# Patient Record
Sex: Female | Born: 1935 | Race: White | Hispanic: No | Smoking: Never smoker
Health system: Southern US, Community
[De-identification: ages and names within clinical notes are randomized; demographics above are authoritative.]

## PROBLEM LIST (undated history)

## (undated) DIAGNOSIS — I10 Essential (primary) hypertension: Secondary | ICD-10-CM

## (undated) DIAGNOSIS — H269 Unspecified cataract: Secondary | ICD-10-CM

## (undated) DIAGNOSIS — H353290 Exudative age-related macular degeneration, unspecified eye, stage unspecified: Secondary | ICD-10-CM

## (undated) HISTORY — DX: Exudative age-related macular degeneration, unspecified eye, stage unspecified: H35.3290

## (undated) HISTORY — PX: SKIN CANCER EXCISION: SHX779

## (undated) HISTORY — DX: Unspecified cataract: H26.9

## (undated) HISTORY — PX: TOTAL ABDOMINAL HYSTERECTOMY: SHX209

## (undated) HISTORY — DX: Essential (primary) hypertension: I10

## (undated) HISTORY — PX: TONSILLECTOMY: SUR1361

## (undated) HISTORY — PX: INGUINAL HERNIA REPAIR: SUR1180

## (undated) HISTORY — PX: CATARACT EXTRACTION, BILATERAL: SHX1313

---
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
## 1998-12-03 ENCOUNTER — Other Ambulatory Visit: Admission: RE | Admit: 1998-12-03 | Discharge: 1998-12-03 | Payer: Self-pay | Admitting: *Deleted

## 2001-11-09 ENCOUNTER — Ambulatory Visit (HOSPITAL_COMMUNITY): Admission: RE | Admit: 2001-11-09 | Discharge: 2001-11-09 | Payer: Self-pay | Admitting: Gastroenterology

## 2001-11-29 ENCOUNTER — Encounter: Payer: Self-pay | Admitting: Gastroenterology

## 2001-11-29 ENCOUNTER — Encounter: Admission: RE | Admit: 2001-11-29 | Discharge: 2001-11-29 | Payer: Self-pay | Admitting: Gastroenterology

## 2004-01-17 ENCOUNTER — Emergency Department (HOSPITAL_COMMUNITY): Admission: EM | Admit: 2004-01-17 | Discharge: 2004-01-17 | Payer: Self-pay | Admitting: Emergency Medicine

## 2007-09-15 ENCOUNTER — Encounter: Admission: RE | Admit: 2007-09-15 | Discharge: 2007-09-15 | Payer: Self-pay | Admitting: Gastroenterology

## 2009-12-21 IMAGING — RF DG ESOPHAGUS
17 of 21 series · 19 of 24 positions shown · non-contrast
Comparison: none

CLINICAL DATA: Chest discomfort.  Difficulty swallowing. 
 BARIUM SWALLOW:

[Series 1: run · 3 of 9 slices shown (1 of 17)]
[im 1/9]
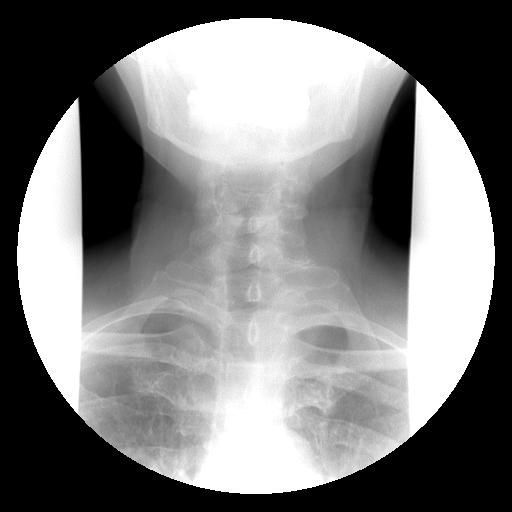
[im 3/9]
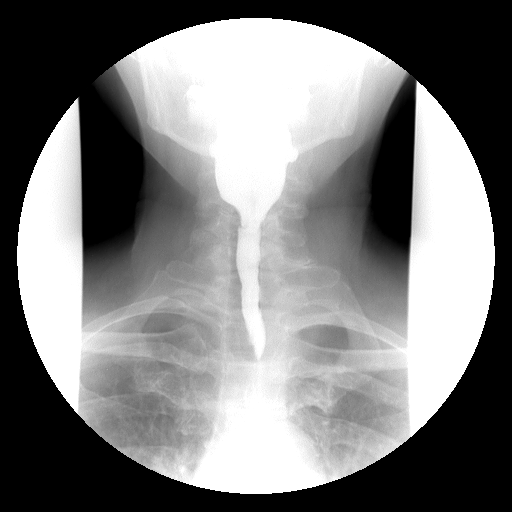
[im 9/9]
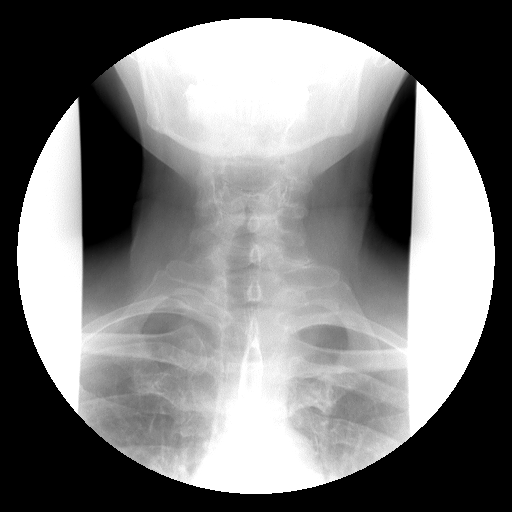

[Series 2: run · 1 of 5 slices shown (2 of 17)]
[im 1/5]
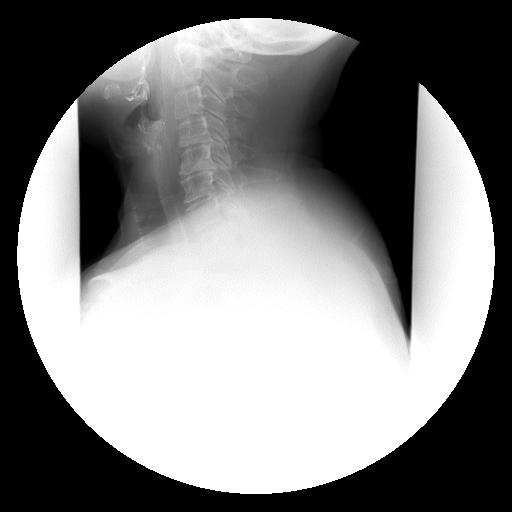

[Series 3: run · 1 of 1 slices shown (3 of 17)]
[im 1/1]
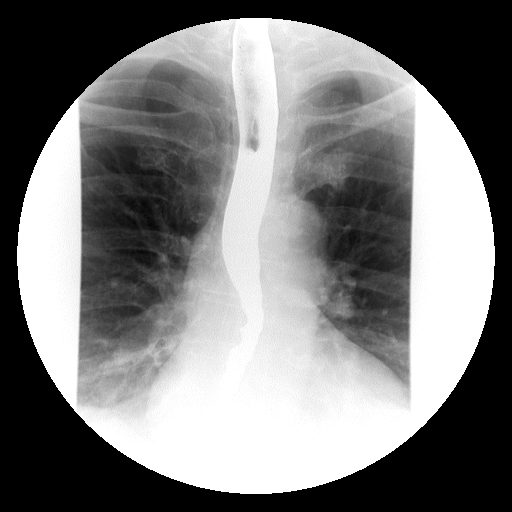

[Series 4: run · 1 of 1 slices shown (4 of 17)]
[im 1/1]
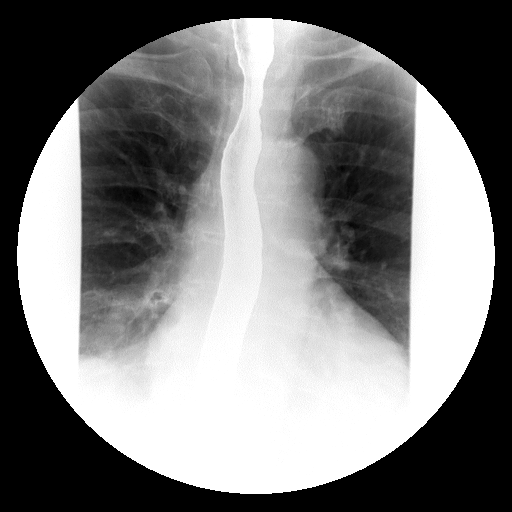

[Series 6: run · 1 of 1 slices shown (5 of 17)]
[im 1/1]
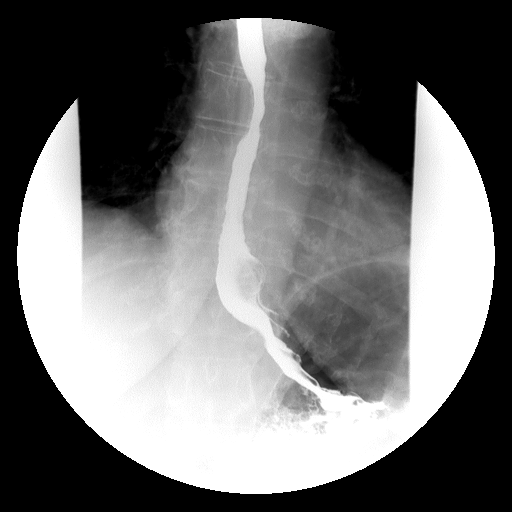

[Series 7: run · 1 of 1 slices shown (6 of 17)]
[im 1/1]
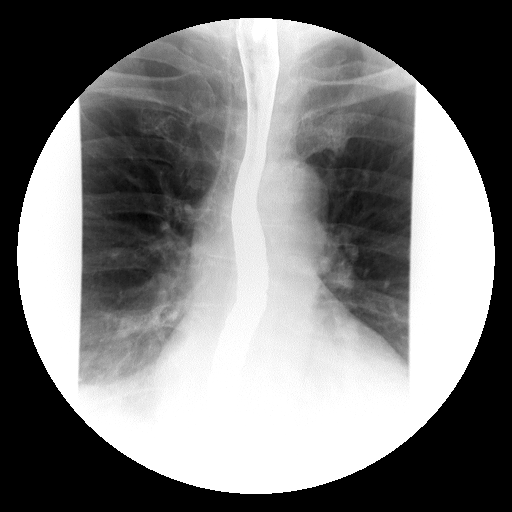

[Series 8: run · 1 of 1 slices shown (7 of 17)]
[im 1/1]
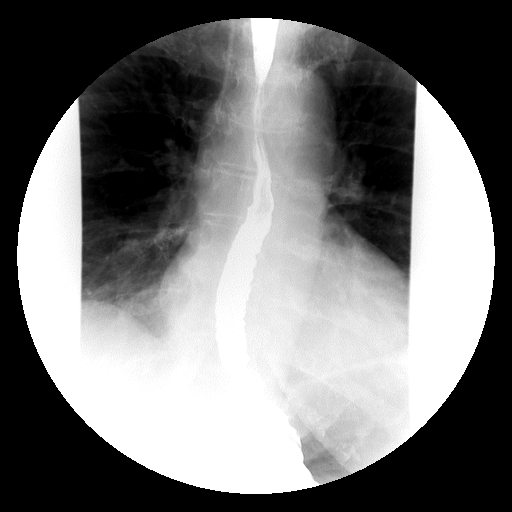

[Series 10: run · 1 of 1 slices shown (8 of 17)]
[im 1/1]
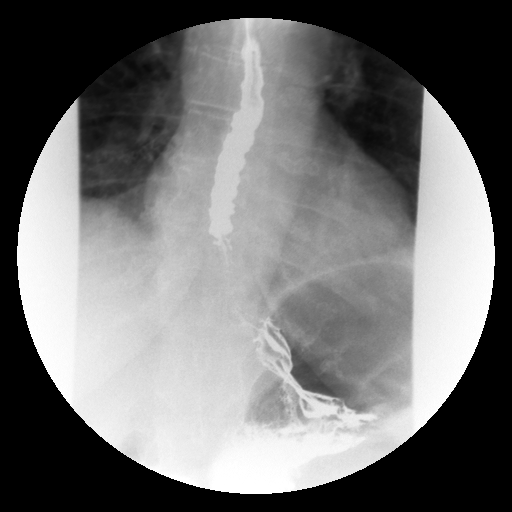

[Series 11: run · 1 of 1 slices shown (9 of 17)]
[im 1/1]
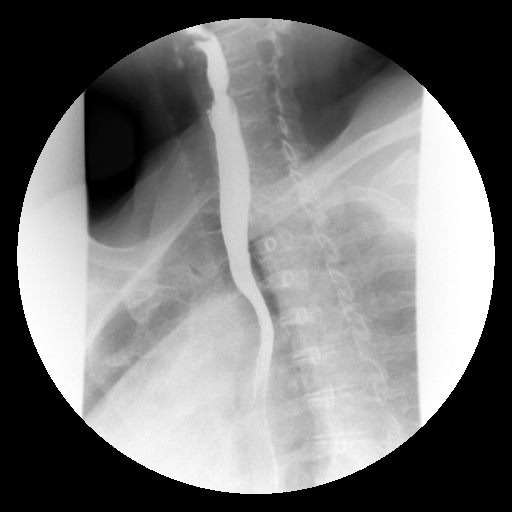

[Series 12: run · 1 of 1 slices shown (10 of 17)]
[im 1/1]
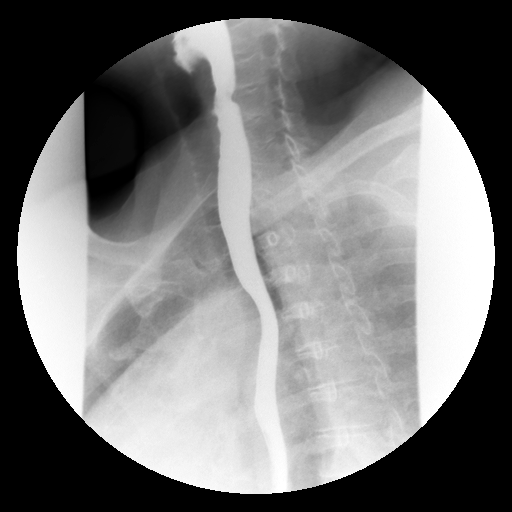

[Series 13: run · 1 of 1 slices shown (11 of 17)]
[im 1/1]
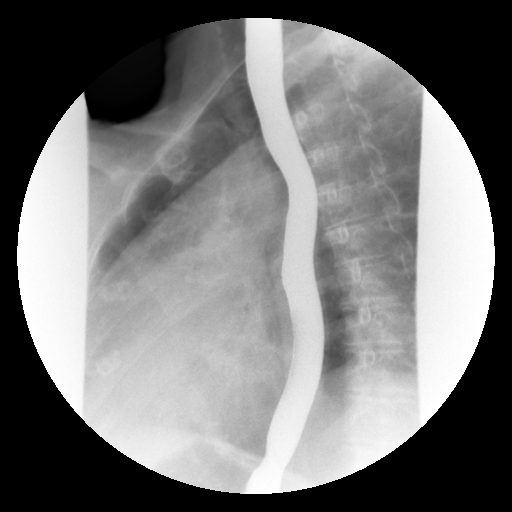

[Series 15: run · 1 of 1 slices shown (12 of 17)]
[im 1/1]
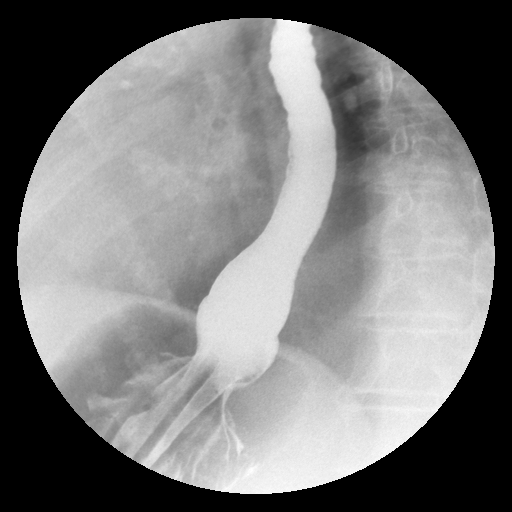

[Series 16: run · 1 of 1 slices shown (13 of 17)]
[im 1/1]
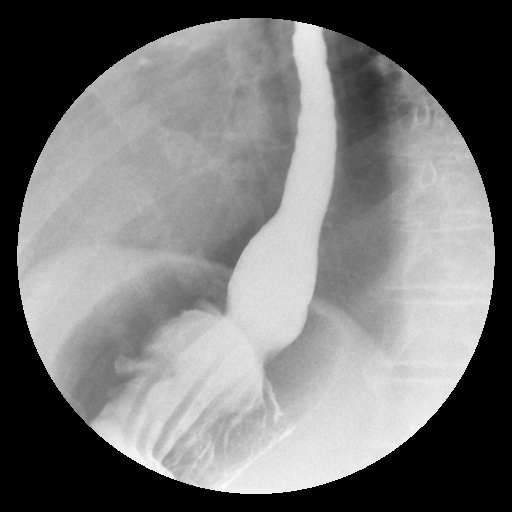

[Series 17: run · 1 of 1 slices shown (14 of 17)]
[im 1/1]
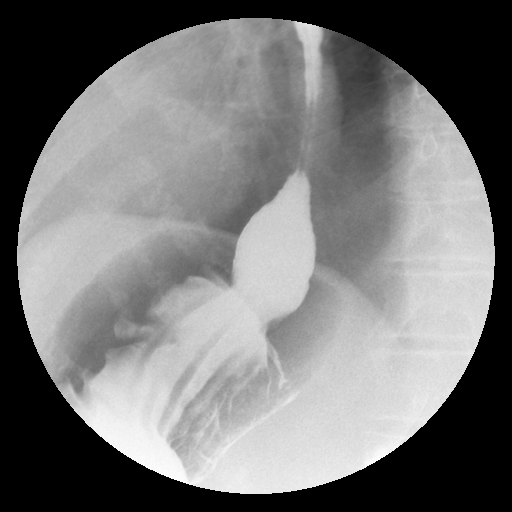

[Series 18: run · 1 of 1 slices shown (15 of 17)]
[im 1/1]
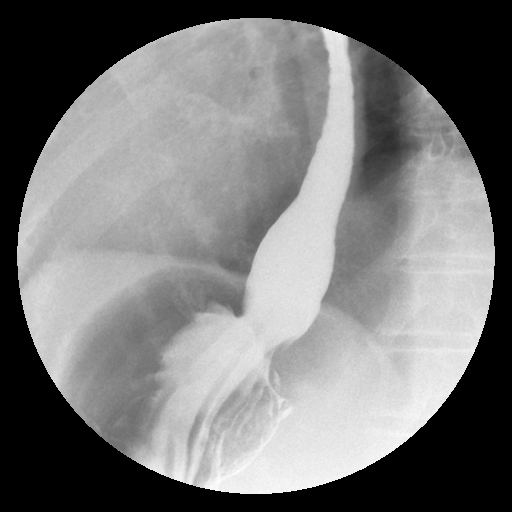

[Series 20: run · 1 of 1 slices shown (16 of 17)]
[im 1/1]
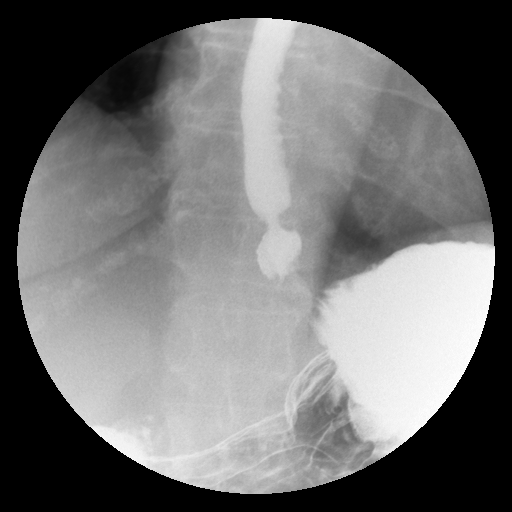

[Series 21: run · 1 of 1 slices shown (17 of 17)]
[im 1/1]
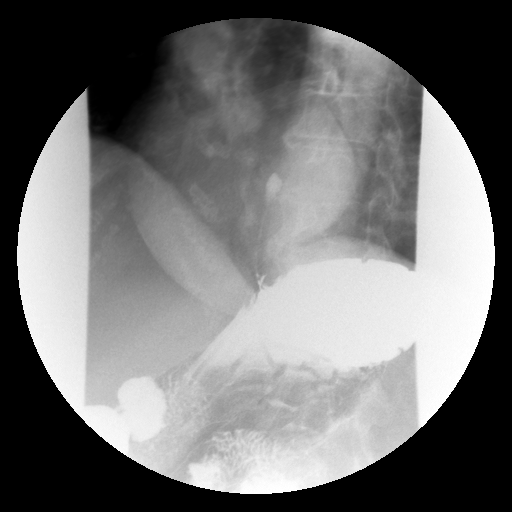

[19 of 24 positions shown; findings below may reference images not displayed]

FINDINGS: A double contrast barium swallow was performed.  The mucosa of the esophagus appears normal.  A single contrast study shows the swallowing mechanism to be normal.  There is slight prominence of the cricopharyngeus muscle.  Mild tertiary contractions are noted distally.  There is a small hiatal hernia present with only faint gastroesophageal reflux noted.  A barium pill was given at the end of the study which did pass into the stomach without delay.
IMPRESSION: 1.  Small hiatal hernia with faint reflux.  Prominent cricopharyngeus muscle. 
 2.  A barium pill passes into the stomach without delay.
 3.  Moderate tertiary contractions.

## 2011-08-16 DIAGNOSIS — M899 Disorder of bone, unspecified: Secondary | ICD-10-CM | POA: Diagnosis not present

## 2011-08-16 DIAGNOSIS — M949 Disorder of cartilage, unspecified: Secondary | ICD-10-CM | POA: Diagnosis not present

## 2011-08-16 DIAGNOSIS — F329 Major depressive disorder, single episode, unspecified: Secondary | ICD-10-CM | POA: Diagnosis not present

## 2011-08-16 DIAGNOSIS — E785 Hyperlipidemia, unspecified: Secondary | ICD-10-CM | POA: Diagnosis not present

## 2011-08-16 DIAGNOSIS — R82998 Other abnormal findings in urine: Secondary | ICD-10-CM | POA: Diagnosis not present

## 2011-08-25 DIAGNOSIS — Z0000 Encounter for general adult medical examination without abnormal findings: Secondary | ICD-10-CM | POA: Diagnosis not present

## 2011-08-25 DIAGNOSIS — M899 Disorder of bone, unspecified: Secondary | ICD-10-CM | POA: Diagnosis not present

## 2011-08-25 DIAGNOSIS — E785 Hyperlipidemia, unspecified: Secondary | ICD-10-CM | POA: Diagnosis not present

## 2011-08-25 DIAGNOSIS — Z79899 Other long term (current) drug therapy: Secondary | ICD-10-CM | POA: Diagnosis not present

## 2011-08-25 DIAGNOSIS — F329 Major depressive disorder, single episode, unspecified: Secondary | ICD-10-CM | POA: Diagnosis not present

## 2011-08-25 DIAGNOSIS — M949 Disorder of cartilage, unspecified: Secondary | ICD-10-CM | POA: Diagnosis not present

## 2011-08-27 DIAGNOSIS — Z1212 Encounter for screening for malignant neoplasm of rectum: Secondary | ICD-10-CM | POA: Diagnosis not present

## 2011-09-22 DIAGNOSIS — H4011X0 Primary open-angle glaucoma, stage unspecified: Secondary | ICD-10-CM | POA: Diagnosis not present

## 2012-03-21 DIAGNOSIS — H4011X0 Primary open-angle glaucoma, stage unspecified: Secondary | ICD-10-CM | POA: Diagnosis not present

## 2012-03-21 DIAGNOSIS — H409 Unspecified glaucoma: Secondary | ICD-10-CM | POA: Diagnosis not present

## 2012-03-24 DIAGNOSIS — L821 Other seborrheic keratosis: Secondary | ICD-10-CM | POA: Diagnosis not present

## 2012-03-24 DIAGNOSIS — L570 Actinic keratosis: Secondary | ICD-10-CM | POA: Diagnosis not present

## 2012-04-11 DIAGNOSIS — Z23 Encounter for immunization: Secondary | ICD-10-CM | POA: Diagnosis not present

## 2012-05-04 DIAGNOSIS — M949 Disorder of cartilage, unspecified: Secondary | ICD-10-CM | POA: Diagnosis not present

## 2012-05-04 DIAGNOSIS — M899 Disorder of bone, unspecified: Secondary | ICD-10-CM | POA: Diagnosis not present

## 2012-05-23 DIAGNOSIS — Z1231 Encounter for screening mammogram for malignant neoplasm of breast: Secondary | ICD-10-CM | POA: Diagnosis not present

## 2012-05-26 DIAGNOSIS — N63 Unspecified lump in unspecified breast: Secondary | ICD-10-CM | POA: Diagnosis not present

## 2012-08-25 DIAGNOSIS — M949 Disorder of cartilage, unspecified: Secondary | ICD-10-CM | POA: Diagnosis not present

## 2012-08-25 DIAGNOSIS — M899 Disorder of bone, unspecified: Secondary | ICD-10-CM | POA: Diagnosis not present

## 2012-08-25 DIAGNOSIS — E785 Hyperlipidemia, unspecified: Secondary | ICD-10-CM | POA: Diagnosis not present

## 2012-08-25 DIAGNOSIS — Z79899 Other long term (current) drug therapy: Secondary | ICD-10-CM | POA: Diagnosis not present

## 2012-09-19 DIAGNOSIS — H353190 Nonexudative age-related macular degeneration, unspecified eye, stage unspecified: Secondary | ICD-10-CM | POA: Diagnosis not present

## 2012-09-19 DIAGNOSIS — H4011X0 Primary open-angle glaucoma, stage unspecified: Secondary | ICD-10-CM | POA: Diagnosis not present

## 2012-09-19 DIAGNOSIS — H409 Unspecified glaucoma: Secondary | ICD-10-CM | POA: Diagnosis not present

## 2012-10-09 DIAGNOSIS — Z0000 Encounter for general adult medical examination without abnormal findings: Secondary | ICD-10-CM | POA: Diagnosis not present

## 2012-10-09 DIAGNOSIS — K219 Gastro-esophageal reflux disease without esophagitis: Secondary | ICD-10-CM | POA: Diagnosis not present

## 2012-10-09 DIAGNOSIS — F329 Major depressive disorder, single episode, unspecified: Secondary | ICD-10-CM | POA: Diagnosis not present

## 2012-10-09 DIAGNOSIS — M949 Disorder of cartilage, unspecified: Secondary | ICD-10-CM | POA: Diagnosis not present

## 2012-10-09 DIAGNOSIS — Z79899 Other long term (current) drug therapy: Secondary | ICD-10-CM | POA: Diagnosis not present

## 2012-10-09 DIAGNOSIS — Z1212 Encounter for screening for malignant neoplasm of rectum: Secondary | ICD-10-CM | POA: Diagnosis not present

## 2012-10-09 DIAGNOSIS — Z23 Encounter for immunization: Secondary | ICD-10-CM | POA: Diagnosis not present

## 2012-10-09 DIAGNOSIS — M899 Disorder of bone, unspecified: Secondary | ICD-10-CM | POA: Diagnosis not present

## 2012-10-09 DIAGNOSIS — E785 Hyperlipidemia, unspecified: Secondary | ICD-10-CM | POA: Diagnosis not present

## 2013-03-22 DIAGNOSIS — H4011X0 Primary open-angle glaucoma, stage unspecified: Secondary | ICD-10-CM | POA: Diagnosis not present

## 2013-03-22 DIAGNOSIS — H409 Unspecified glaucoma: Secondary | ICD-10-CM | POA: Diagnosis not present

## 2013-05-22 DIAGNOSIS — Z23 Encounter for immunization: Secondary | ICD-10-CM | POA: Diagnosis not present

## 2013-05-23 DIAGNOSIS — Z09 Encounter for follow-up examination after completed treatment for conditions other than malignant neoplasm: Secondary | ICD-10-CM | POA: Diagnosis not present

## 2013-05-23 DIAGNOSIS — Z86010 Personal history of colonic polyps: Secondary | ICD-10-CM | POA: Diagnosis not present

## 2013-05-23 DIAGNOSIS — K5730 Diverticulosis of large intestine without perforation or abscess without bleeding: Secondary | ICD-10-CM | POA: Diagnosis not present

## 2013-06-12 DIAGNOSIS — N6019 Diffuse cystic mastopathy of unspecified breast: Secondary | ICD-10-CM | POA: Diagnosis not present

## 2013-06-21 DIAGNOSIS — D0439 Carcinoma in situ of skin of other parts of face: Secondary | ICD-10-CM | POA: Diagnosis not present

## 2013-06-21 DIAGNOSIS — L821 Other seborrheic keratosis: Secondary | ICD-10-CM | POA: Diagnosis not present

## 2013-06-21 DIAGNOSIS — D239 Other benign neoplasm of skin, unspecified: Secondary | ICD-10-CM | POA: Diagnosis not present

## 2013-06-21 DIAGNOSIS — L570 Actinic keratosis: Secondary | ICD-10-CM | POA: Diagnosis not present

## 2013-06-21 DIAGNOSIS — D0430 Carcinoma in situ of skin of unspecified part of face: Secondary | ICD-10-CM | POA: Diagnosis not present

## 2013-06-21 DIAGNOSIS — D485 Neoplasm of uncertain behavior of skin: Secondary | ICD-10-CM | POA: Diagnosis not present

## 2013-06-21 DIAGNOSIS — Z85828 Personal history of other malignant neoplasm of skin: Secondary | ICD-10-CM | POA: Diagnosis not present

## 2013-06-21 DIAGNOSIS — L909 Atrophic disorder of skin, unspecified: Secondary | ICD-10-CM | POA: Diagnosis not present

## 2013-08-01 DIAGNOSIS — Z85828 Personal history of other malignant neoplasm of skin: Secondary | ICD-10-CM | POA: Diagnosis not present

## 2013-08-01 DIAGNOSIS — D0439 Carcinoma in situ of skin of other parts of face: Secondary | ICD-10-CM | POA: Diagnosis not present

## 2013-08-01 DIAGNOSIS — D0430 Carcinoma in situ of skin of unspecified part of face: Secondary | ICD-10-CM | POA: Diagnosis not present

## 2013-08-01 DIAGNOSIS — L738 Other specified follicular disorders: Secondary | ICD-10-CM | POA: Diagnosis not present

## 2013-09-24 DIAGNOSIS — H4011X0 Primary open-angle glaucoma, stage unspecified: Secondary | ICD-10-CM | POA: Diagnosis not present

## 2013-09-24 DIAGNOSIS — H52209 Unspecified astigmatism, unspecified eye: Secondary | ICD-10-CM | POA: Diagnosis not present

## 2013-09-24 DIAGNOSIS — H409 Unspecified glaucoma: Secondary | ICD-10-CM | POA: Diagnosis not present

## 2013-09-24 DIAGNOSIS — H2510 Age-related nuclear cataract, unspecified eye: Secondary | ICD-10-CM | POA: Diagnosis not present

## 2013-10-08 DIAGNOSIS — E785 Hyperlipidemia, unspecified: Secondary | ICD-10-CM | POA: Diagnosis not present

## 2013-10-08 DIAGNOSIS — M899 Disorder of bone, unspecified: Secondary | ICD-10-CM | POA: Diagnosis not present

## 2013-10-08 DIAGNOSIS — Z79899 Other long term (current) drug therapy: Secondary | ICD-10-CM | POA: Diagnosis not present

## 2013-10-08 DIAGNOSIS — M949 Disorder of cartilage, unspecified: Secondary | ICD-10-CM | POA: Diagnosis not present

## 2013-10-15 DIAGNOSIS — Z79899 Other long term (current) drug therapy: Secondary | ICD-10-CM | POA: Diagnosis not present

## 2013-10-15 DIAGNOSIS — Z1331 Encounter for screening for depression: Secondary | ICD-10-CM | POA: Diagnosis not present

## 2013-10-15 DIAGNOSIS — Z1212 Encounter for screening for malignant neoplasm of rectum: Secondary | ICD-10-CM | POA: Diagnosis not present

## 2013-10-15 DIAGNOSIS — Z0000 Encounter for general adult medical examination without abnormal findings: Secondary | ICD-10-CM | POA: Diagnosis not present

## 2013-10-15 DIAGNOSIS — E785 Hyperlipidemia, unspecified: Secondary | ICD-10-CM | POA: Diagnosis not present

## 2013-10-15 DIAGNOSIS — K219 Gastro-esophageal reflux disease without esophagitis: Secondary | ICD-10-CM | POA: Diagnosis not present

## 2013-10-15 DIAGNOSIS — M949 Disorder of cartilage, unspecified: Secondary | ICD-10-CM | POA: Diagnosis not present

## 2013-10-15 DIAGNOSIS — F329 Major depressive disorder, single episode, unspecified: Secondary | ICD-10-CM | POA: Diagnosis not present

## 2013-10-15 DIAGNOSIS — M899 Disorder of bone, unspecified: Secondary | ICD-10-CM | POA: Diagnosis not present

## 2013-10-15 DIAGNOSIS — F3289 Other specified depressive episodes: Secondary | ICD-10-CM | POA: Diagnosis not present

## 2013-10-26 DIAGNOSIS — Z85828 Personal history of other malignant neoplasm of skin: Secondary | ICD-10-CM | POA: Diagnosis not present

## 2013-10-26 DIAGNOSIS — L819 Disorder of pigmentation, unspecified: Secondary | ICD-10-CM | POA: Diagnosis not present

## 2013-10-26 DIAGNOSIS — L821 Other seborrheic keratosis: Secondary | ICD-10-CM | POA: Diagnosis not present

## 2013-10-26 DIAGNOSIS — Z23 Encounter for immunization: Secondary | ICD-10-CM | POA: Diagnosis not present

## 2013-10-26 DIAGNOSIS — L570 Actinic keratosis: Secondary | ICD-10-CM | POA: Diagnosis not present

## 2014-05-03 DIAGNOSIS — H2513 Age-related nuclear cataract, bilateral: Secondary | ICD-10-CM | POA: Diagnosis not present

## 2014-05-03 DIAGNOSIS — H3531 Nonexudative age-related macular degeneration: Secondary | ICD-10-CM | POA: Diagnosis not present

## 2014-05-03 DIAGNOSIS — H4011X1 Primary open-angle glaucoma, mild stage: Secondary | ICD-10-CM | POA: Diagnosis not present

## 2014-05-16 DIAGNOSIS — H2512 Age-related nuclear cataract, left eye: Secondary | ICD-10-CM | POA: Diagnosis not present

## 2014-05-16 DIAGNOSIS — H25812 Combined forms of age-related cataract, left eye: Secondary | ICD-10-CM | POA: Diagnosis not present

## 2014-05-17 DIAGNOSIS — Z23 Encounter for immunization: Secondary | ICD-10-CM | POA: Diagnosis not present

## 2014-05-23 DIAGNOSIS — H2511 Age-related nuclear cataract, right eye: Secondary | ICD-10-CM | POA: Diagnosis not present

## 2014-05-23 DIAGNOSIS — H25811 Combined forms of age-related cataract, right eye: Secondary | ICD-10-CM | POA: Diagnosis not present

## 2014-06-25 DIAGNOSIS — M8580 Other specified disorders of bone density and structure, unspecified site: Secondary | ICD-10-CM | POA: Diagnosis not present

## 2014-06-25 DIAGNOSIS — Z8262 Family history of osteoporosis: Secondary | ICD-10-CM | POA: Diagnosis not present

## 2014-06-25 DIAGNOSIS — Z1231 Encounter for screening mammogram for malignant neoplasm of breast: Secondary | ICD-10-CM | POA: Diagnosis not present

## 2014-09-10 DIAGNOSIS — D692 Other nonthrombocytopenic purpura: Secondary | ICD-10-CM | POA: Diagnosis not present

## 2014-09-10 DIAGNOSIS — D225 Melanocytic nevi of trunk: Secondary | ICD-10-CM | POA: Diagnosis not present

## 2014-09-10 DIAGNOSIS — L570 Actinic keratosis: Secondary | ICD-10-CM | POA: Diagnosis not present

## 2014-09-10 DIAGNOSIS — L821 Other seborrheic keratosis: Secondary | ICD-10-CM | POA: Diagnosis not present

## 2014-09-10 DIAGNOSIS — L918 Other hypertrophic disorders of the skin: Secondary | ICD-10-CM | POA: Diagnosis not present

## 2014-09-10 DIAGNOSIS — L820 Inflamed seborrheic keratosis: Secondary | ICD-10-CM | POA: Diagnosis not present

## 2014-09-10 DIAGNOSIS — Z85828 Personal history of other malignant neoplasm of skin: Secondary | ICD-10-CM | POA: Diagnosis not present

## 2014-09-10 DIAGNOSIS — L814 Other melanin hyperpigmentation: Secondary | ICD-10-CM | POA: Diagnosis not present

## 2014-09-10 DIAGNOSIS — D1801 Hemangioma of skin and subcutaneous tissue: Secondary | ICD-10-CM | POA: Diagnosis not present

## 2014-10-30 DIAGNOSIS — H40033 Anatomical narrow angle, bilateral: Secondary | ICD-10-CM | POA: Diagnosis not present

## 2014-10-30 DIAGNOSIS — H4011X1 Primary open-angle glaucoma, mild stage: Secondary | ICD-10-CM | POA: Diagnosis not present

## 2014-11-04 DIAGNOSIS — E785 Hyperlipidemia, unspecified: Secondary | ICD-10-CM | POA: Diagnosis not present

## 2014-11-04 DIAGNOSIS — Z79899 Other long term (current) drug therapy: Secondary | ICD-10-CM | POA: Diagnosis not present

## 2014-11-04 DIAGNOSIS — M859 Disorder of bone density and structure, unspecified: Secondary | ICD-10-CM | POA: Diagnosis not present

## 2014-11-11 DIAGNOSIS — Z0000 Encounter for general adult medical examination without abnormal findings: Secondary | ICD-10-CM | POA: Diagnosis not present

## 2014-11-11 DIAGNOSIS — E785 Hyperlipidemia, unspecified: Secondary | ICD-10-CM | POA: Diagnosis not present

## 2014-11-11 DIAGNOSIS — Z79899 Other long term (current) drug therapy: Secondary | ICD-10-CM | POA: Diagnosis not present

## 2014-11-11 DIAGNOSIS — Z6827 Body mass index (BMI) 27.0-27.9, adult: Secondary | ICD-10-CM | POA: Diagnosis not present

## 2014-11-11 DIAGNOSIS — F329 Major depressive disorder, single episode, unspecified: Secondary | ICD-10-CM | POA: Diagnosis not present

## 2014-11-11 DIAGNOSIS — M859 Disorder of bone density and structure, unspecified: Secondary | ICD-10-CM | POA: Diagnosis not present

## 2014-11-11 DIAGNOSIS — Z1212 Encounter for screening for malignant neoplasm of rectum: Secondary | ICD-10-CM | POA: Diagnosis not present

## 2014-11-11 DIAGNOSIS — Z1389 Encounter for screening for other disorder: Secondary | ICD-10-CM | POA: Diagnosis not present

## 2014-11-11 DIAGNOSIS — R7301 Impaired fasting glucose: Secondary | ICD-10-CM | POA: Diagnosis not present

## 2015-02-25 ENCOUNTER — Ambulatory Visit (INDEPENDENT_AMBULATORY_CARE_PROVIDER_SITE_OTHER): Payer: Medicare Other

## 2015-02-25 ENCOUNTER — Ambulatory Visit (INDEPENDENT_AMBULATORY_CARE_PROVIDER_SITE_OTHER): Payer: Medicare Other | Admitting: Podiatry

## 2015-02-25 VITALS — BP 152/78 | HR 75 | Resp 15

## 2015-02-25 DIAGNOSIS — M722 Plantar fascial fibromatosis: Secondary | ICD-10-CM

## 2015-02-25 MED ORDER — MELOXICAM 15 MG PO TABS: 15.0000 mg | ORAL_TABLET | Freq: Every day | ORAL | Status: DC

## 2015-02-25 NOTE — Progress Notes (Signed)
   Subjective:    Patient ID: Barbara Mccoy, female    DOB: 09/21/1932, 81 y.o.   MRN: [id number]  HPIThis patient presents to the office with pain in her left heel since April.  She says she had been exercising on her tread mill and she experiences pain after exercising. She says her pain has been present and she has stopped exercising.  Her pain is noted upon rising in the morning and standing from a sitting position. She says she needs to move her foot at times to limit pain while sitting.    Review of Systems  Musculoskeletal: Positive for gait problem.  All other systems reviewed and are negative.      Objective:   Physical Exam GENERAL APPEARANCE: Alert, conversant. Appropriately groomed. No acute distress.  VASCULAR: Pedal pulses palpable at 2/4 DP and PT bilateral.  Capillary refill time is immediate to all digits,  Proximal to distal cooling it warm to warm.  Digital hair growth is present bilateral  NEUROLOGIC: sensation is intact epicritically and protectively to 5.07 monofilament at 5/5 sites bilateral.  Light touch is intact bilateral, vibratory sensation intact bilateral, achilles tendon reflex is intact bilateral.  MUSCULOSKELETAL: acceptable muscle strength, tone and stability bilateral.  Intrinsic muscluature intact bilateral.  Rectus appearance of foot and digits noted bilateral. Palpable pain at the insertion plantar fascia left foot.  Pain along the course of the plantar fascia left foot.  DERMATOLOGIC: skin color, texture, and turgor are within normal limits.  No preulcerative lesions or ulcers  are seen, no interdigital maceration noted.  No open lesions present.  Digital nails are asymptomatic. No drainage noted.         Assessment & Plan:  Plantar Fasciitis left heel.    IE  X-ray  Examined her OTC insoles and they were questionable.  She says her pain is only a four.  We discussed treatment and decided to use ice and stretching in addition to Mobic.  RTC 2  weeks.

## 2015-03-21 ENCOUNTER — Telehealth: Payer: Self-pay | Admitting: *Deleted

## 2015-03-21 MED ORDER — MELOXICAM 15 MG PO TABS: 15.0000 mg | ORAL_TABLET | Freq: Every day | ORAL | Status: DC

## 2015-03-21 NOTE — Telephone Encounter (Signed)
Pt request refill of Mobic to get her to the 03/28/2015 appt with Dr. Prudence Davidson.  Dr. Prudence Davidson states refill #10 Meloxicam 15mg .

## 2015-03-28 ENCOUNTER — Telehealth: Payer: Self-pay

## 2015-03-28 ENCOUNTER — Encounter: Payer: Self-pay | Admitting: Podiatry

## 2015-03-28 ENCOUNTER — Ambulatory Visit (INDEPENDENT_AMBULATORY_CARE_PROVIDER_SITE_OTHER): Payer: Medicare Other | Admitting: Podiatry

## 2015-03-28 VITALS — BP 158/82 | HR 67 | Resp 17

## 2015-03-28 DIAGNOSIS — M722 Plantar fascial fibromatosis: Secondary | ICD-10-CM | POA: Diagnosis not present

## 2015-03-28 NOTE — Progress Notes (Signed)
Subjective:     Patient ID: Barbara Mccoy, female   DOB: 03-28-1933, 81 y.o.   MRN: [id number]  HPI This patient presents to the office saying her plantar fascitis left foot has been doing better and she is experiencing less pain.  She has been taking Mobic which was prescribed and she is having no side effects from the medication.She presents for continued evaluation and treatment.  Review of Systems     Objective:   Physical Exam GENERAL APPEARANCE: Alert, conversant. Appropriately groomed. No acute distress.  VASCULAR: Pedal pulses palpable at  [facility] and PT bilateral.  Capillary refill time is immediate to all digits,  Normal temperature gradient.  Digital hair growth is present bilateral  NEUROLOGIC: sensation is normal to 5.07 monofilament at 5/5 sites bilateral.  Light touch is intact bilateral, Muscle strength normal.  MUSCULOSKELETAL: acceptable muscle strength, tone and stability bilateral.  Intrinsic muscluature intact bilateral.  Rectus appearance of foot and digits noted bilateral. Persistant left heel swelling but no pain noted.  DERMATOLOGIC: skin color, texture, and turgor are within normal limits.  No preulcerative lesions or ulcers  are seen, no interdigital maceration noted.  No open lesions present.  Digital nails are asymptomatic. No drainage noted.      Assessment:     Plantar fascitis     Plan:     ROV>  Prescribed two additional prescriptions of Mobic.  RTC 2 months.

## 2015-03-28 NOTE — Telephone Encounter (Signed)
Called in Mobic 1 QDPC 2 refills , called from [facility]

## 2015-05-23 DIAGNOSIS — K219 Gastro-esophageal reflux disease without esophagitis: Secondary | ICD-10-CM | POA: Diagnosis not present

## 2015-05-23 DIAGNOSIS — E785 Hyperlipidemia, unspecified: Secondary | ICD-10-CM | POA: Diagnosis not present

## 2015-05-23 DIAGNOSIS — Z6828 Body mass index (BMI) 28.0-28.9, adult: Secondary | ICD-10-CM | POA: Diagnosis not present

## 2015-05-23 DIAGNOSIS — R7301 Impaired fasting glucose: Secondary | ICD-10-CM | POA: Diagnosis not present

## 2015-05-23 DIAGNOSIS — Z23 Encounter for immunization: Secondary | ICD-10-CM | POA: Diagnosis not present

## 2015-05-23 DIAGNOSIS — M859 Disorder of bone density and structure, unspecified: Secondary | ICD-10-CM | POA: Diagnosis not present

## 2015-05-23 DIAGNOSIS — F329 Major depressive disorder, single episode, unspecified: Secondary | ICD-10-CM | POA: Diagnosis not present

## 2015-07-10 DIAGNOSIS — Z1231 Encounter for screening mammogram for malignant neoplasm of breast: Secondary | ICD-10-CM | POA: Diagnosis not present

## 2015-08-26 DIAGNOSIS — H401111 Primary open-angle glaucoma, right eye, mild stage: Secondary | ICD-10-CM | POA: Diagnosis not present

## 2015-08-26 DIAGNOSIS — H401122 Primary open-angle glaucoma, left eye, moderate stage: Secondary | ICD-10-CM | POA: Diagnosis not present

## 2015-08-26 DIAGNOSIS — H353132 Nonexudative age-related macular degeneration, bilateral, intermediate dry stage: Secondary | ICD-10-CM | POA: Diagnosis not present

## 2015-08-26 DIAGNOSIS — H52203 Unspecified astigmatism, bilateral: Secondary | ICD-10-CM | POA: Diagnosis not present

## 2015-11-05 DIAGNOSIS — R7301 Impaired fasting glucose: Secondary | ICD-10-CM | POA: Diagnosis not present

## 2015-11-05 DIAGNOSIS — Z79899 Other long term (current) drug therapy: Secondary | ICD-10-CM | POA: Diagnosis not present

## 2015-11-05 DIAGNOSIS — E784 Other hyperlipidemia: Secondary | ICD-10-CM | POA: Diagnosis not present

## 2015-11-05 DIAGNOSIS — M859 Disorder of bone density and structure, unspecified: Secondary | ICD-10-CM | POA: Diagnosis not present

## 2015-11-12 DIAGNOSIS — Z6828 Body mass index (BMI) 28.0-28.9, adult: Secondary | ICD-10-CM | POA: Diagnosis not present

## 2015-11-12 DIAGNOSIS — M859 Disorder of bone density and structure, unspecified: Secondary | ICD-10-CM | POA: Diagnosis not present

## 2015-11-12 DIAGNOSIS — Z1389 Encounter for screening for other disorder: Secondary | ICD-10-CM | POA: Diagnosis not present

## 2015-11-12 DIAGNOSIS — Z0000 Encounter for general adult medical examination without abnormal findings: Secondary | ICD-10-CM | POA: Diagnosis not present

## 2015-11-12 DIAGNOSIS — F329 Major depressive disorder, single episode, unspecified: Secondary | ICD-10-CM | POA: Diagnosis not present

## 2015-11-12 DIAGNOSIS — E784 Other hyperlipidemia: Secondary | ICD-10-CM | POA: Diagnosis not present

## 2015-11-12 DIAGNOSIS — Z23 Encounter for immunization: Secondary | ICD-10-CM | POA: Diagnosis not present

## 2015-11-12 DIAGNOSIS — R7301 Impaired fasting glucose: Secondary | ICD-10-CM | POA: Diagnosis not present

## 2015-11-12 DIAGNOSIS — H5310 Unspecified subjective visual disturbances: Secondary | ICD-10-CM | POA: Diagnosis not present

## 2015-11-12 DIAGNOSIS — K219 Gastro-esophageal reflux disease without esophagitis: Secondary | ICD-10-CM | POA: Diagnosis not present

## 2015-11-13 ENCOUNTER — Other Ambulatory Visit (HOSPITAL_COMMUNITY): Payer: Self-pay | Admitting: Internal Medicine

## 2015-11-13 ENCOUNTER — Ambulatory Visit (HOSPITAL_COMMUNITY)
Admission: RE | Admit: 2015-11-13 | Discharge: 2015-11-13 | Disposition: A | Discharge status: Home / Self Care | Payer: Medicare Other | Source: Ambulatory Visit | Attending: Vascular Surgery | Admitting: Vascular Surgery

## 2015-11-13 DIAGNOSIS — H539 Unspecified visual disturbance: Secondary | ICD-10-CM

## 2015-11-13 DIAGNOSIS — D2262 Melanocytic nevi of left upper limb, including shoulder: Secondary | ICD-10-CM | POA: Diagnosis not present

## 2015-11-13 DIAGNOSIS — D1801 Hemangioma of skin and subcutaneous tissue: Secondary | ICD-10-CM | POA: Diagnosis not present

## 2015-11-13 DIAGNOSIS — Z85828 Personal history of other malignant neoplasm of skin: Secondary | ICD-10-CM | POA: Diagnosis not present

## 2015-11-13 DIAGNOSIS — L821 Other seborrheic keratosis: Secondary | ICD-10-CM | POA: Diagnosis not present

## 2015-11-13 DIAGNOSIS — D225 Melanocytic nevi of trunk: Secondary | ICD-10-CM | POA: Diagnosis not present

## 2015-11-25 DIAGNOSIS — Z1212 Encounter for screening for malignant neoplasm of rectum: Secondary | ICD-10-CM | POA: Diagnosis not present

## 2016-01-06 DIAGNOSIS — H40011 Open angle with borderline findings, low risk, right eye: Secondary | ICD-10-CM | POA: Diagnosis not present

## 2016-01-06 DIAGNOSIS — H40012 Open angle with borderline findings, low risk, left eye: Secondary | ICD-10-CM | POA: Diagnosis not present

## 2016-04-17 DIAGNOSIS — Z23 Encounter for immunization: Secondary | ICD-10-CM | POA: Diagnosis not present

## 2016-06-29 DIAGNOSIS — H40012 Open angle with borderline findings, low risk, left eye: Secondary | ICD-10-CM | POA: Diagnosis not present

## 2016-06-29 DIAGNOSIS — Z0100 Encounter for examination of eyes and vision without abnormal findings: Secondary | ICD-10-CM | POA: Diagnosis not present

## 2016-06-29 DIAGNOSIS — H353132 Nonexudative age-related macular degeneration, bilateral, intermediate dry stage: Secondary | ICD-10-CM | POA: Diagnosis not present

## 2016-06-29 DIAGNOSIS — H40011 Open angle with borderline findings, low risk, right eye: Secondary | ICD-10-CM | POA: Diagnosis not present

## 2016-07-12 DIAGNOSIS — Z1231 Encounter for screening mammogram for malignant neoplasm of breast: Secondary | ICD-10-CM | POA: Diagnosis not present

## 2016-07-12 DIAGNOSIS — M85852 Other specified disorders of bone density and structure, left thigh: Secondary | ICD-10-CM | POA: Diagnosis not present

## 2016-11-10 DIAGNOSIS — R7301 Impaired fasting glucose: Secondary | ICD-10-CM | POA: Diagnosis not present

## 2016-11-10 DIAGNOSIS — E784 Other hyperlipidemia: Secondary | ICD-10-CM | POA: Diagnosis not present

## 2016-11-10 DIAGNOSIS — Z79899 Other long term (current) drug therapy: Secondary | ICD-10-CM | POA: Diagnosis not present

## 2016-11-10 DIAGNOSIS — M859 Disorder of bone density and structure, unspecified: Secondary | ICD-10-CM | POA: Diagnosis not present

## 2016-11-18 DIAGNOSIS — Z6828 Body mass index (BMI) 28.0-28.9, adult: Secondary | ICD-10-CM | POA: Diagnosis not present

## 2016-11-18 DIAGNOSIS — K219 Gastro-esophageal reflux disease without esophagitis: Secondary | ICD-10-CM | POA: Diagnosis not present

## 2016-11-18 DIAGNOSIS — R7301 Impaired fasting glucose: Secondary | ICD-10-CM | POA: Diagnosis not present

## 2016-11-18 DIAGNOSIS — F329 Major depressive disorder, single episode, unspecified: Secondary | ICD-10-CM | POA: Diagnosis not present

## 2016-11-18 DIAGNOSIS — Z79899 Other long term (current) drug therapy: Secondary | ICD-10-CM | POA: Diagnosis not present

## 2016-11-18 DIAGNOSIS — M859 Disorder of bone density and structure, unspecified: Secondary | ICD-10-CM | POA: Diagnosis not present

## 2016-11-18 DIAGNOSIS — Z1389 Encounter for screening for other disorder: Secondary | ICD-10-CM | POA: Diagnosis not present

## 2016-11-18 DIAGNOSIS — E784 Other hyperlipidemia: Secondary | ICD-10-CM | POA: Diagnosis not present

## 2016-11-18 DIAGNOSIS — Z0000 Encounter for general adult medical examination without abnormal findings: Secondary | ICD-10-CM | POA: Diagnosis not present

## 2016-11-22 DIAGNOSIS — Z1212 Encounter for screening for malignant neoplasm of rectum: Secondary | ICD-10-CM | POA: Diagnosis not present

## 2016-12-24 DIAGNOSIS — D692 Other nonthrombocytopenic purpura: Secondary | ICD-10-CM | POA: Diagnosis not present

## 2016-12-24 DIAGNOSIS — D2262 Melanocytic nevi of left upper limb, including shoulder: Secondary | ICD-10-CM | POA: Diagnosis not present

## 2016-12-24 DIAGNOSIS — Z85828 Personal history of other malignant neoplasm of skin: Secondary | ICD-10-CM | POA: Diagnosis not present

## 2016-12-24 DIAGNOSIS — L570 Actinic keratosis: Secondary | ICD-10-CM | POA: Diagnosis not present

## 2016-12-24 DIAGNOSIS — L821 Other seborrheic keratosis: Secondary | ICD-10-CM | POA: Diagnosis not present

## 2016-12-24 DIAGNOSIS — D225 Melanocytic nevi of trunk: Secondary | ICD-10-CM | POA: Diagnosis not present

## 2016-12-24 DIAGNOSIS — D2261 Melanocytic nevi of right upper limb, including shoulder: Secondary | ICD-10-CM | POA: Diagnosis not present

## 2016-12-24 DIAGNOSIS — D1801 Hemangioma of skin and subcutaneous tissue: Secondary | ICD-10-CM | POA: Diagnosis not present

## 2016-12-24 DIAGNOSIS — D485 Neoplasm of uncertain behavior of skin: Secondary | ICD-10-CM | POA: Diagnosis not present

## 2017-02-10 DIAGNOSIS — Z85828 Personal history of other malignant neoplasm of skin: Secondary | ICD-10-CM | POA: Diagnosis not present

## 2017-02-10 DIAGNOSIS — D692 Other nonthrombocytopenic purpura: Secondary | ICD-10-CM | POA: Diagnosis not present

## 2017-02-10 DIAGNOSIS — L570 Actinic keratosis: Secondary | ICD-10-CM | POA: Diagnosis not present

## 2017-05-19 DIAGNOSIS — E7849 Other hyperlipidemia: Secondary | ICD-10-CM | POA: Diagnosis not present

## 2017-05-19 DIAGNOSIS — Z6828 Body mass index (BMI) 28.0-28.9, adult: Secondary | ICD-10-CM | POA: Diagnosis not present

## 2017-05-19 DIAGNOSIS — K219 Gastro-esophageal reflux disease without esophagitis: Secondary | ICD-10-CM | POA: Diagnosis not present

## 2017-05-19 DIAGNOSIS — Z23 Encounter for immunization: Secondary | ICD-10-CM | POA: Diagnosis not present

## 2017-05-19 DIAGNOSIS — M859 Disorder of bone density and structure, unspecified: Secondary | ICD-10-CM | POA: Diagnosis not present

## 2017-05-19 DIAGNOSIS — F329 Major depressive disorder, single episode, unspecified: Secondary | ICD-10-CM | POA: Diagnosis not present

## 2017-05-19 DIAGNOSIS — R7301 Impaired fasting glucose: Secondary | ICD-10-CM | POA: Diagnosis not present

## 2017-08-04 DIAGNOSIS — N6012 Diffuse cystic mastopathy of left breast: Secondary | ICD-10-CM | POA: Diagnosis not present

## 2017-08-04 DIAGNOSIS — R928 Other abnormal and inconclusive findings on diagnostic imaging of breast: Secondary | ICD-10-CM | POA: Diagnosis not present

## 2017-08-04 DIAGNOSIS — Z1231 Encounter for screening mammogram for malignant neoplasm of breast: Secondary | ICD-10-CM | POA: Diagnosis not present

## 2017-08-10 DIAGNOSIS — R928 Other abnormal and inconclusive findings on diagnostic imaging of breast: Secondary | ICD-10-CM | POA: Diagnosis not present

## 2017-08-10 DIAGNOSIS — N6012 Diffuse cystic mastopathy of left breast: Secondary | ICD-10-CM | POA: Diagnosis not present

## 2017-08-10 DIAGNOSIS — Z1231 Encounter for screening mammogram for malignant neoplasm of breast: Secondary | ICD-10-CM | POA: Diagnosis not present

## 2017-08-29 DIAGNOSIS — Z961 Presence of intraocular lens: Secondary | ICD-10-CM | POA: Diagnosis not present

## 2017-08-29 DIAGNOSIS — H52203 Unspecified astigmatism, bilateral: Secondary | ICD-10-CM | POA: Diagnosis not present

## 2017-08-29 DIAGNOSIS — H353132 Nonexudative age-related macular degeneration, bilateral, intermediate dry stage: Secondary | ICD-10-CM | POA: Diagnosis not present

## 2017-11-14 DIAGNOSIS — R82998 Other abnormal findings in urine: Secondary | ICD-10-CM | POA: Diagnosis not present

## 2017-11-14 DIAGNOSIS — M859 Disorder of bone density and structure, unspecified: Secondary | ICD-10-CM | POA: Diagnosis not present

## 2017-11-14 DIAGNOSIS — M8580 Other specified disorders of bone density and structure, unspecified site: Secondary | ICD-10-CM | POA: Diagnosis not present

## 2017-11-14 DIAGNOSIS — E7849 Other hyperlipidemia: Secondary | ICD-10-CM | POA: Diagnosis not present

## 2017-11-14 DIAGNOSIS — R7301 Impaired fasting glucose: Secondary | ICD-10-CM | POA: Diagnosis not present

## 2017-11-21 DIAGNOSIS — R7301 Impaired fasting glucose: Secondary | ICD-10-CM | POA: Diagnosis not present

## 2017-11-21 DIAGNOSIS — Z0000 Encounter for general adult medical examination without abnormal findings: Secondary | ICD-10-CM | POA: Diagnosis not present

## 2017-11-21 DIAGNOSIS — M8580 Other specified disorders of bone density and structure, unspecified site: Secondary | ICD-10-CM | POA: Diagnosis not present

## 2017-11-21 DIAGNOSIS — Z6827 Body mass index (BMI) 27.0-27.9, adult: Secondary | ICD-10-CM | POA: Diagnosis not present

## 2017-11-21 DIAGNOSIS — K219 Gastro-esophageal reflux disease without esophagitis: Secondary | ICD-10-CM | POA: Diagnosis not present

## 2017-11-21 DIAGNOSIS — Z1389 Encounter for screening for other disorder: Secondary | ICD-10-CM | POA: Diagnosis not present

## 2017-11-21 DIAGNOSIS — H539 Unspecified visual disturbance: Secondary | ICD-10-CM | POA: Diagnosis not present

## 2017-11-21 DIAGNOSIS — E7849 Other hyperlipidemia: Secondary | ICD-10-CM | POA: Diagnosis not present

## 2017-11-21 DIAGNOSIS — F329 Major depressive disorder, single episode, unspecified: Secondary | ICD-10-CM | POA: Diagnosis not present

## 2017-12-02 DIAGNOSIS — Z1212 Encounter for screening for malignant neoplasm of rectum: Secondary | ICD-10-CM | POA: Diagnosis not present

## 2018-02-09 DIAGNOSIS — N6012 Diffuse cystic mastopathy of left breast: Secondary | ICD-10-CM | POA: Diagnosis not present

## 2018-02-09 DIAGNOSIS — R928 Other abnormal and inconclusive findings on diagnostic imaging of breast: Secondary | ICD-10-CM | POA: Diagnosis not present

## 2018-03-16 DIAGNOSIS — D1801 Hemangioma of skin and subcutaneous tissue: Secondary | ICD-10-CM | POA: Diagnosis not present

## 2018-03-16 DIAGNOSIS — Z85828 Personal history of other malignant neoplasm of skin: Secondary | ICD-10-CM | POA: Diagnosis not present

## 2018-03-16 DIAGNOSIS — L570 Actinic keratosis: Secondary | ICD-10-CM | POA: Diagnosis not present

## 2018-03-16 DIAGNOSIS — D692 Other nonthrombocytopenic purpura: Secondary | ICD-10-CM | POA: Diagnosis not present

## 2018-03-16 DIAGNOSIS — D225 Melanocytic nevi of trunk: Secondary | ICD-10-CM | POA: Diagnosis not present

## 2018-03-16 DIAGNOSIS — L821 Other seborrheic keratosis: Secondary | ICD-10-CM | POA: Diagnosis not present

## 2018-04-12 DIAGNOSIS — H353132 Nonexudative age-related macular degeneration, bilateral, intermediate dry stage: Secondary | ICD-10-CM | POA: Diagnosis not present

## 2018-04-29 DIAGNOSIS — Z23 Encounter for immunization: Secondary | ICD-10-CM | POA: Diagnosis not present

## 2018-05-25 DIAGNOSIS — K219 Gastro-esophageal reflux disease without esophagitis: Secondary | ICD-10-CM | POA: Diagnosis not present

## 2018-05-25 DIAGNOSIS — M859 Disorder of bone density and structure, unspecified: Secondary | ICD-10-CM | POA: Diagnosis not present

## 2018-05-25 DIAGNOSIS — F329 Major depressive disorder, single episode, unspecified: Secondary | ICD-10-CM | POA: Diagnosis not present

## 2018-05-25 DIAGNOSIS — E7849 Other hyperlipidemia: Secondary | ICD-10-CM | POA: Diagnosis not present

## 2018-05-25 DIAGNOSIS — E663 Overweight: Secondary | ICD-10-CM | POA: Diagnosis not present

## 2018-05-25 DIAGNOSIS — R7301 Impaired fasting glucose: Secondary | ICD-10-CM | POA: Diagnosis not present

## 2018-05-25 DIAGNOSIS — Z6827 Body mass index (BMI) 27.0-27.9, adult: Secondary | ICD-10-CM | POA: Diagnosis not present

## 2018-08-07 DIAGNOSIS — Z90710 Acquired absence of both cervix and uterus: Secondary | ICD-10-CM | POA: Diagnosis not present

## 2018-08-07 DIAGNOSIS — M8589 Other specified disorders of bone density and structure, multiple sites: Secondary | ICD-10-CM | POA: Diagnosis not present

## 2018-08-07 DIAGNOSIS — Z8262 Family history of osteoporosis: Secondary | ICD-10-CM | POA: Diagnosis not present

## 2018-08-07 DIAGNOSIS — Z1231 Encounter for screening mammogram for malignant neoplasm of breast: Secondary | ICD-10-CM | POA: Diagnosis not present

## 2018-08-31 DIAGNOSIS — L820 Inflamed seborrheic keratosis: Secondary | ICD-10-CM | POA: Diagnosis not present

## 2018-08-31 DIAGNOSIS — L570 Actinic keratosis: Secondary | ICD-10-CM | POA: Diagnosis not present

## 2018-08-31 DIAGNOSIS — L308 Other specified dermatitis: Secondary | ICD-10-CM | POA: Diagnosis not present

## 2018-08-31 DIAGNOSIS — Z85828 Personal history of other malignant neoplasm of skin: Secondary | ICD-10-CM | POA: Diagnosis not present

## 2018-08-31 DIAGNOSIS — D485 Neoplasm of uncertain behavior of skin: Secondary | ICD-10-CM | POA: Diagnosis not present

## 2018-08-31 DIAGNOSIS — C44519 Basal cell carcinoma of skin of other part of trunk: Secondary | ICD-10-CM | POA: Diagnosis not present

## 2018-08-31 DIAGNOSIS — C44529 Squamous cell carcinoma of skin of other part of trunk: Secondary | ICD-10-CM | POA: Diagnosis not present

## 2018-08-31 DIAGNOSIS — D0471 Carcinoma in situ of skin of right lower limb, including hip: Secondary | ICD-10-CM | POA: Diagnosis not present

## 2018-10-10 DIAGNOSIS — H353211 Exudative age-related macular degeneration, right eye, with active choroidal neovascularization: Secondary | ICD-10-CM | POA: Diagnosis not present

## 2018-10-10 DIAGNOSIS — H353122 Nonexudative age-related macular degeneration, left eye, intermediate dry stage: Secondary | ICD-10-CM | POA: Diagnosis not present

## 2018-10-11 DIAGNOSIS — H353132 Nonexudative age-related macular degeneration, bilateral, intermediate dry stage: Secondary | ICD-10-CM | POA: Diagnosis not present

## 2018-10-11 DIAGNOSIS — H43822 Vitreomacular adhesion, left eye: Secondary | ICD-10-CM | POA: Diagnosis not present

## 2018-10-11 DIAGNOSIS — H353211 Exudative age-related macular degeneration, right eye, with active choroidal neovascularization: Secondary | ICD-10-CM | POA: Diagnosis not present

## 2018-10-11 DIAGNOSIS — H43821 Vitreomacular adhesion, right eye: Secondary | ICD-10-CM | POA: Diagnosis not present

## 2018-11-13 DIAGNOSIS — H353211 Exudative age-related macular degeneration, right eye, with active choroidal neovascularization: Secondary | ICD-10-CM | POA: Diagnosis not present

## 2018-11-13 DIAGNOSIS — H43821 Vitreomacular adhesion, right eye: Secondary | ICD-10-CM | POA: Diagnosis not present

## 2018-12-19 DIAGNOSIS — H353211 Exudative age-related macular degeneration, right eye, with active choroidal neovascularization: Secondary | ICD-10-CM | POA: Diagnosis not present

## 2018-12-19 DIAGNOSIS — H43821 Vitreomacular adhesion, right eye: Secondary | ICD-10-CM | POA: Diagnosis not present

## 2019-01-04 DIAGNOSIS — L570 Actinic keratosis: Secondary | ICD-10-CM | POA: Diagnosis not present

## 2019-01-04 DIAGNOSIS — Z85828 Personal history of other malignant neoplasm of skin: Secondary | ICD-10-CM | POA: Diagnosis not present

## 2019-01-04 DIAGNOSIS — L820 Inflamed seborrheic keratosis: Secondary | ICD-10-CM | POA: Diagnosis not present

## 2019-02-06 DIAGNOSIS — H353211 Exudative age-related macular degeneration, right eye, with active choroidal neovascularization: Secondary | ICD-10-CM | POA: Diagnosis not present

## 2019-02-06 DIAGNOSIS — H43822 Vitreomacular adhesion, left eye: Secondary | ICD-10-CM | POA: Diagnosis not present

## 2019-02-06 DIAGNOSIS — H353132 Nonexudative age-related macular degeneration, bilateral, intermediate dry stage: Secondary | ICD-10-CM | POA: Diagnosis not present

## 2019-02-06 DIAGNOSIS — H43821 Vitreomacular adhesion, right eye: Secondary | ICD-10-CM | POA: Diagnosis not present

## 2019-03-27 DIAGNOSIS — H353221 Exudative age-related macular degeneration, left eye, with active choroidal neovascularization: Secondary | ICD-10-CM | POA: Diagnosis not present

## 2019-03-27 DIAGNOSIS — H353211 Exudative age-related macular degeneration, right eye, with active choroidal neovascularization: Secondary | ICD-10-CM | POA: Diagnosis not present

## 2019-03-27 DIAGNOSIS — H43821 Vitreomacular adhesion, right eye: Secondary | ICD-10-CM | POA: Diagnosis not present

## 2019-04-05 DIAGNOSIS — H43822 Vitreomacular adhesion, left eye: Secondary | ICD-10-CM | POA: Diagnosis not present

## 2019-04-05 DIAGNOSIS — H353132 Nonexudative age-related macular degeneration, bilateral, intermediate dry stage: Secondary | ICD-10-CM | POA: Diagnosis not present

## 2019-04-05 DIAGNOSIS — H353221 Exudative age-related macular degeneration, left eye, with active choroidal neovascularization: Secondary | ICD-10-CM | POA: Diagnosis not present

## 2019-04-05 DIAGNOSIS — H353211 Exudative age-related macular degeneration, right eye, with active choroidal neovascularization: Secondary | ICD-10-CM | POA: Diagnosis not present

## 2019-04-17 DIAGNOSIS — H52203 Unspecified astigmatism, bilateral: Secondary | ICD-10-CM | POA: Diagnosis not present

## 2019-04-17 DIAGNOSIS — Z961 Presence of intraocular lens: Secondary | ICD-10-CM | POA: Diagnosis not present

## 2019-05-07 DIAGNOSIS — H353221 Exudative age-related macular degeneration, left eye, with active choroidal neovascularization: Secondary | ICD-10-CM | POA: Diagnosis not present

## 2019-05-07 DIAGNOSIS — H43822 Vitreomacular adhesion, left eye: Secondary | ICD-10-CM | POA: Diagnosis not present

## 2019-05-10 DIAGNOSIS — L814 Other melanin hyperpigmentation: Secondary | ICD-10-CM | POA: Diagnosis not present

## 2019-05-10 DIAGNOSIS — Z85828 Personal history of other malignant neoplasm of skin: Secondary | ICD-10-CM | POA: Diagnosis not present

## 2019-05-10 DIAGNOSIS — D225 Melanocytic nevi of trunk: Secondary | ICD-10-CM | POA: Diagnosis not present

## 2019-05-10 DIAGNOSIS — L821 Other seborrheic keratosis: Secondary | ICD-10-CM | POA: Diagnosis not present

## 2019-05-10 DIAGNOSIS — D1801 Hemangioma of skin and subcutaneous tissue: Secondary | ICD-10-CM | POA: Diagnosis not present

## 2019-05-10 DIAGNOSIS — L820 Inflamed seborrheic keratosis: Secondary | ICD-10-CM | POA: Diagnosis not present

## 2019-05-10 DIAGNOSIS — L570 Actinic keratosis: Secondary | ICD-10-CM | POA: Diagnosis not present

## 2019-05-15 DIAGNOSIS — E7849 Other hyperlipidemia: Secondary | ICD-10-CM | POA: Diagnosis not present

## 2019-05-15 DIAGNOSIS — M859 Disorder of bone density and structure, unspecified: Secondary | ICD-10-CM | POA: Diagnosis not present

## 2019-05-15 DIAGNOSIS — R7301 Impaired fasting glucose: Secondary | ICD-10-CM | POA: Diagnosis not present

## 2019-05-17 DIAGNOSIS — Z23 Encounter for immunization: Secondary | ICD-10-CM | POA: Diagnosis not present

## 2019-05-21 DIAGNOSIS — R7301 Impaired fasting glucose: Secondary | ICD-10-CM | POA: Diagnosis not present

## 2019-05-21 DIAGNOSIS — K219 Gastro-esophageal reflux disease without esophagitis: Secondary | ICD-10-CM | POA: Diagnosis not present

## 2019-05-21 DIAGNOSIS — Z0000 Encounter for general adult medical examination without abnormal findings: Secondary | ICD-10-CM | POA: Diagnosis not present

## 2019-05-21 DIAGNOSIS — F329 Major depressive disorder, single episode, unspecified: Secondary | ICD-10-CM | POA: Diagnosis not present

## 2019-05-22 DIAGNOSIS — R82998 Other abnormal findings in urine: Secondary | ICD-10-CM | POA: Diagnosis not present

## 2019-05-23 DIAGNOSIS — Z1212 Encounter for screening for malignant neoplasm of rectum: Secondary | ICD-10-CM | POA: Diagnosis not present

## 2019-05-24 DIAGNOSIS — H43821 Vitreomacular adhesion, right eye: Secondary | ICD-10-CM | POA: Diagnosis not present

## 2019-05-24 DIAGNOSIS — H353211 Exudative age-related macular degeneration, right eye, with active choroidal neovascularization: Secondary | ICD-10-CM | POA: Diagnosis not present

## 2019-06-18 DIAGNOSIS — H353132 Nonexudative age-related macular degeneration, bilateral, intermediate dry stage: Secondary | ICD-10-CM | POA: Diagnosis not present

## 2019-06-18 DIAGNOSIS — H43822 Vitreomacular adhesion, left eye: Secondary | ICD-10-CM | POA: Diagnosis not present

## 2019-06-18 DIAGNOSIS — H353221 Exudative age-related macular degeneration, left eye, with active choroidal neovascularization: Secondary | ICD-10-CM | POA: Diagnosis not present

## 2019-08-16 DIAGNOSIS — Z23 Encounter for immunization: Secondary | ICD-10-CM | POA: Diagnosis not present

## 2019-09-12 DIAGNOSIS — Z23 Encounter for immunization: Secondary | ICD-10-CM | POA: Diagnosis not present

## 2019-09-24 DIAGNOSIS — H353221 Exudative age-related macular degeneration, left eye, with active choroidal neovascularization: Secondary | ICD-10-CM | POA: Diagnosis not present

## 2019-09-24 DIAGNOSIS — H353211 Exudative age-related macular degeneration, right eye, with active choroidal neovascularization: Secondary | ICD-10-CM | POA: Diagnosis not present

## 2019-09-24 DIAGNOSIS — H353132 Nonexudative age-related macular degeneration, bilateral, intermediate dry stage: Secondary | ICD-10-CM | POA: Diagnosis not present

## 2019-09-25 DIAGNOSIS — H43821 Vitreomacular adhesion, right eye: Secondary | ICD-10-CM | POA: Diagnosis not present

## 2019-09-25 DIAGNOSIS — H353132 Nonexudative age-related macular degeneration, bilateral, intermediate dry stage: Secondary | ICD-10-CM | POA: Diagnosis not present

## 2019-09-25 DIAGNOSIS — H43811 Vitreous degeneration, right eye: Secondary | ICD-10-CM | POA: Diagnosis not present

## 2019-09-25 DIAGNOSIS — H353211 Exudative age-related macular degeneration, right eye, with active choroidal neovascularization: Secondary | ICD-10-CM | POA: Diagnosis not present

## 2019-11-05 DIAGNOSIS — Z1231 Encounter for screening mammogram for malignant neoplasm of breast: Secondary | ICD-10-CM | POA: Diagnosis not present

## 2019-11-08 ENCOUNTER — Encounter (INDEPENDENT_AMBULATORY_CARE_PROVIDER_SITE_OTHER): Payer: Medicare Other | Admitting: Ophthalmology

## 2019-11-08 ENCOUNTER — Other Ambulatory Visit: Payer: Self-pay

## 2019-11-08 ENCOUNTER — Encounter (INDEPENDENT_AMBULATORY_CARE_PROVIDER_SITE_OTHER): Payer: Self-pay | Admitting: Ophthalmology

## 2019-11-08 ENCOUNTER — Ambulatory Visit (INDEPENDENT_AMBULATORY_CARE_PROVIDER_SITE_OTHER): Payer: Medicare Other | Admitting: Ophthalmology

## 2019-11-08 DIAGNOSIS — H43821 Vitreomacular adhesion, right eye: Secondary | ICD-10-CM

## 2019-11-08 DIAGNOSIS — H353211 Exudative age-related macular degeneration, right eye, with active choroidal neovascularization: Secondary | ICD-10-CM

## 2019-11-08 DIAGNOSIS — H353222 Exudative age-related macular degeneration, left eye, with inactive choroidal neovascularization: Secondary | ICD-10-CM | POA: Insufficient documentation

## 2019-11-08 DIAGNOSIS — H353221 Exudative age-related macular degeneration, left eye, with active choroidal neovascularization: Secondary | ICD-10-CM | POA: Diagnosis not present

## 2019-11-08 HISTORY — DX: Vitreomacular adhesion, right eye: H43.821

## 2019-11-08 MED ORDER — BEVACIZUMAB CHEMO INJECTION 1.25MG/0.05ML SYRINGE FOR KALEIDOSCOPE
1.2500 mg | INTRAVITREAL | Status: AC | PRN
  Administered 2019-11-08: 1.25 mg via INTRAVITREAL

## 2019-11-08 NOTE — Patient Instructions (Signed)

## 2019-11-08 NOTE — Progress Notes (Signed)
11/08/2019     CHIEF COMPLAINT Patient presents for Retina Follow Up   HISTORY OF PRESENT ILLNESS: Barbara Mccoy is a 86 y.o. female who presents to the clinic today for:   HPI    Retina Follow Up    Patient presents with  Wet AMD.  In left eye.  This started 6 weeks ago.  Severity is mild.  Duration of 6 weeks.  Since onset it is stable.          Comments    6 Week AMD F/U OS, poss Avastin OS - OCT  Pt c/o intermittent floaters OS, but sts it is hard to tell whether they are new or not. No other changes to [place] OU. VA OD stable.       Last edited by Rockie Neighbours, [place] on 11/08/2019  1:32 PM. (History)      Referring physician: No referring provider defined for this encounter.  HISTORICAL INFORMATION:   Selected notes from the MEDICAL RECORD NUMBER       CURRENT MEDICATIONS: No current outpatient medications on file. (Ophthalmic Drugs)   No current facility-administered medications for this visit. (Ophthalmic Drugs)   Current Outpatient Medications (Other)  Medication Sig  . aspirin 81 MG tablet Take 81 mg by mouth daily.  . calcium carbonate (OS-CAL) 600 MG TABS tablet Take 600 mg by mouth 2 (two) times daily with a meal.  . meloxicam (MOBIC) 15 MG tablet Take 1 tablet (15 mg total) by mouth daily. After meals  . Multiple Vitamins-Minerals (ICAPS AREDS 2 PO) Take by mouth.  . simvastatin (ZOCOR) 20 MG tablet Take 20 mg by mouth daily.   No current facility-administered medications for this visit. (Other)      REVIEW OF SYSTEMS:    ALLERGIES Allergies  Allergen Reactions  . Codeine Rash    PAST MEDICAL HISTORY History reviewed. No pertinent past medical history. History reviewed. No pertinent surgical history.  FAMILY HISTORY History reviewed. No pertinent family history.  SOCIAL HISTORY Social History   Tobacco Use  . Smoking status: Never Smoker  . Smokeless tobacco: Never Used  Substance Use Topics  . Alcohol use: Not on file  . Drug use:  Not on file         OPHTHALMIC EXAM:  Base Eye Exam    Visual Acuity (Snellen - Linear)      Right Left   Dist cc 20/20 -1 20/20 -1   Correction: Glasses       Tonometry (Tonopen, 1:36 PM)      Right Left   Pressure 18 15       Pupils      Pupils Dark Light Shape React APD   Right PERRL 3 2 Round Brisk None   Left PERRL 3 2 Round Brisk None       Visual Fields (Counting fingers)      Left Right    Full Full       Extraocular Movement      Right Left    Full Full       Neuro/Psych    Oriented x3: Yes   Mood/Affect: Normal       Dilation    Left eye: 1.0% Mydriacyl, 2.5% Phenylephrine @ 1:37 PM        Slit Lamp and Fundus Exam    External Exam      Right Left   External Normal Normal       Slit Lamp Exam  Right Left   Lids/Lashes Normal Normal   Conjunctiva/Sclera White and quiet White and quiet   Cornea Clear Clear   Anterior Chamber Deep and quiet Deep and quiet   Iris Round and reactive Round and reactive   Lens Posterior chamber intraocular lens Posterior chamber intraocular lens   Vitreous Normal Normal       Fundus Exam      Right Left   Disc  Normal   C/D Ratio  0.35   Macula  Atrophy, Retinal pigment epithelial atrophy, Age related macular degeneration, Early age related macular degeneration, Drusen, Hard drusen   Vessels  Normal   Periphery  Normal          IMAGING AND PROCEDURES  Imaging and Procedures for @TODAY @  OCT, Retina - OU - Both Eyes       Right Eye Quality was good. Findings include normal observations, retinal drusen , outer retinal atrophy.   Left Eye Quality was good. Scan locations included subfoveal. Findings include normal observations, outer retinal atrophy, retinal drusen , vitreomacular adhesion .   Notes OD with diffuse retinal atrophy, drusen and much less subretinal fluid the no active CN VM stable at 7 weeks.  Plan is to reevaluate right eye at 3 months  Best with much less CN VM activity,  less CME, and incidental vitreal macular adhesion stable and improved at 6-7 weeks.  Repeat Avastin today       Intravitreal Injection, Pharmacologic Agent - OS - Left Eye       Time Out 11/08/2019. 3:38 PM. Confirmed correct patient, procedure, site, and patient consented.   Anesthesia Topical anesthesia was used. Anesthetic medications included Proparacaine 0.5%.   Procedure Preparation included 10% betadine to eyelids, Tobramycin 0.3%.   Injection:  1.25 mg Bevacizumab (AVASTIN) SOLN   NDC: [id number]   Route: Intravitreal, Site: Left Eye, Waste: 0 mg  Post-op Post injection exam found visual acuity of at least counting fingers. The patient tolerated the procedure well. There were no complications. The patient received written and verbal post procedure care education. Post injection medications were not given.                 ASSESSMENT/PLAN:  @PROBAPNOTE @    ICD-10-CM   1. Exudative age-related macular degeneration of left eye with active choroidal neovascularization (HCC)  H35.3221 OCT, Retina - OU - Both Eyes    Intravitreal Injection, Pharmacologic Agent - OS - Left Eye    Bevacizumab (AVASTIN) SOLN 1.25 mg  2. Exudative age-related macular degeneration of right eye with active choroidal neovascularization ([place])  H35.3211   3. Vitreomacular adhesion of right eye  H43.821     1.  2.  3.  Ophthalmic Meds Ordered this visit:  Meds ordered this encounter  Medications  . Bevacizumab (AVASTIN) SOLN 1.25 mg       Return in about 6 weeks (around 12/20/2019) for AVASTIN OCT, OS.  Patient Instructions  The nature of wet macular degeneration was discussed with the patient.  Forms of therapy reviewed include the use of Anti-VEGF medications injected painlessly into the eye, as well as other possible treatment modalities, including thermal laser therapy. Fellow eye involvement and risks were discussed with the patient. Upon the finding of wet age related macular  degeneration, treatment will be offered. The treatment regimen is on a treat as needed basis with the intent to treat if necessary and extend interval of exams when possible. On average 1 out of 6 patients do not  need lifetime therapy. However, the risk of recurrent disease is high for a lifetime.  Initially monthly, then periodic, examinations and evaluations will determine whether the next treatment is required on the day of the examination.    Explained the diagnoses, plan, and follow up with the patient and they expressed understanding.  Patient expressed understanding of the importance of proper follow up care.   Clent Demark Harel Repetto M.D. Diseases & Surgery of the Retina and Vitreous Retina & Diabetic [place] @TODAY @     Abbreviations: M myopia (nearsighted); A astigmatism; H hyperopia (farsighted); P presbyopia; Mrx spectacle prescription;  CTL contact lenses; OD right eye; OS left eye; OU both eyes  XT exotropia; ET esotropia; PEK punctate epithelial keratitis; PEE punctate epithelial erosions; DES dry eye syndrome; MGD meibomian gland dysfunction; ATs artificial tears; PFAT's preservative free artificial tears; [place] nuclear sclerotic cataract; PSC posterior subcapsular cataract; ERM epi-retinal membrane; PVD posterior vitreous detachment; RD retinal detachment; DM diabetes mellitus; DR diabetic retinopathy; NPDR non-proliferative diabetic retinopathy; PDR proliferative diabetic retinopathy; CSME clinically significant macular edema; DME diabetic macular edema; dbh dot blot hemorrhages; CWS cotton wool spot; POAG primary open angle glaucoma; C/D cup-to-disc ratio; HVF humphrey visual field; GVF goldmann visual field; OCT optical coherence tomography; IOP intraocular pressure; BRVO Branch retinal vein occlusion; CRVO central retinal vein occlusion; CRAO central retinal artery occlusion; BRAO branch retinal artery occlusion; RT retinal tear; SB scleral buckle; PPV pars plana vitrectomy; VH Vitreous  hemorrhage; PRP panretinal laser photocoagulation; IVK intravitreal kenalog; VMT vitreomacular traction; MH Macular hole;  NVD neovascularization of the disc; NVE neovascularization elsewhere; AREDS age related eye disease study; ARMD age related macular degeneration; POAG primary open angle glaucoma; EBMD epithelial/anterior basement membrane dystrophy; ACIOL anterior chamber intraocular lens; IOL intraocular lens; PCIOL posterior chamber intraocular lens; Phaco/IOL phacoemulsification with intraocular lens placement; [place] photorefractive keratectomy; LASIK laser assisted in situ keratomileusis; HTN hypertension; DM diabetes mellitus; COPD chronic obstructive pulmonary disease

## 2019-11-28 DIAGNOSIS — K219 Gastro-esophageal reflux disease without esophagitis: Secondary | ICD-10-CM | POA: Diagnosis not present

## 2019-11-28 DIAGNOSIS — M8580 Other specified disorders of bone density and structure, unspecified site: Secondary | ICD-10-CM | POA: Diagnosis not present

## 2019-11-28 DIAGNOSIS — F329 Major depressive disorder, single episode, unspecified: Secondary | ICD-10-CM | POA: Diagnosis not present

## 2019-11-28 DIAGNOSIS — E785 Hyperlipidemia, unspecified: Secondary | ICD-10-CM | POA: Diagnosis not present

## 2019-11-28 DIAGNOSIS — E663 Overweight: Secondary | ICD-10-CM | POA: Diagnosis not present

## 2019-11-28 DIAGNOSIS — R7301 Impaired fasting glucose: Secondary | ICD-10-CM | POA: Diagnosis not present

## 2019-12-20 ENCOUNTER — Ambulatory Visit (INDEPENDENT_AMBULATORY_CARE_PROVIDER_SITE_OTHER): Payer: Medicare Other | Admitting: Ophthalmology

## 2019-12-20 ENCOUNTER — Encounter (INDEPENDENT_AMBULATORY_CARE_PROVIDER_SITE_OTHER): Payer: Self-pay | Admitting: Ophthalmology

## 2019-12-20 ENCOUNTER — Other Ambulatory Visit: Payer: Self-pay

## 2019-12-20 DIAGNOSIS — H353221 Exudative age-related macular degeneration, left eye, with active choroidal neovascularization: Secondary | ICD-10-CM | POA: Diagnosis not present

## 2019-12-20 DIAGNOSIS — H353211 Exudative age-related macular degeneration, right eye, with active choroidal neovascularization: Secondary | ICD-10-CM | POA: Diagnosis not present

## 2019-12-20 MED ORDER — BEVACIZUMAB CHEMO INJECTION 1.25MG/0.05ML SYRINGE FOR KALEIDOSCOPE
1.2500 mg | INTRAVITREAL | Status: AC | PRN
  Administered 2019-12-20: 1.25 mg via INTRAVITREAL

## 2019-12-20 NOTE — Progress Notes (Signed)
12/20/2019     CHIEF COMPLAINT Patient presents for Retina Follow Up   HISTORY OF PRESENT ILLNESS: Barbara Mccoy is a 86 y.o. female who presents to the clinic today for:   HPI    Retina Follow Up    Patient presents with  Wet AMD.  In left eye.  Severity is moderate.  Duration of 6 weeks.  Since onset it is stable.  I, the attending physician,  performed the HPI with the patient and updated documentation appropriately.          Comments    6 Week AMD f\u OS. Possible Avastin OS. OCT  Pt states vision is stable. Pt states floaters in OS have improved.       Last edited by [name] on 12/20/2019  1:21 PM. (History)      Referring physician: Burnard Bunting, MD [address] [place],  [place] [address]  HISTORICAL INFORMATION:   Selected notes from the [facility]: No current outpatient medications on file. (Ophthalmic Drugs)   No current facility-administered medications for this visit. (Ophthalmic Drugs)   Current Outpatient Medications (Other)  Medication Sig  . aspirin 81 MG tablet Take 81 mg by mouth daily.  . calcium carbonate (OS-CAL) 600 MG TABS tablet Take 600 mg by mouth 2 (two) times daily with a meal.  . meloxicam (MOBIC) 15 MG tablet Take 1 tablet (15 mg total) by mouth daily. After meals (Patient not taking: Reported on 11/08/2019)  . Multiple Vitamins-Minerals (ICAPS AREDS 2 PO) Take by mouth.  . simvastatin (ZOCOR) 20 MG tablet Take 20 mg by mouth daily.   No current facility-administered medications for this visit. (Other)      REVIEW OF SYSTEMS:    ALLERGIES Allergies  Allergen Reactions  . Codeine Rash    PAST MEDICAL HISTORY History reviewed. No pertinent past medical history. History reviewed. No pertinent surgical history.  FAMILY HISTORY History reviewed. No pertinent family history.  SOCIAL HISTORY Social History   Tobacco Use  . Smoking status: Never Smoker  . Smokeless  tobacco: Never Used  Substance Use Topics  . Alcohol use: Not on file  . Drug use: Not on file         OPHTHALMIC EXAM: Base Eye Exam    Visual Acuity (Snellen - Linear)      Right Left   Dist cc 20/25 + 20/25 +   Correction: Glasses       Tonometry (Tonopen, 1:27 PM)      Right Left   Pressure 13 16       Pupils      Dark Light Shape React APD   Right 4 3 Round Brisk None   Left 4 3 Round Brisk None       Visual Fields (Counting fingers)      Left Right    Full Full       Neuro/Psych    Oriented x3: Yes   Mood/Affect: Normal       Dilation    Left eye: 1.0% Mydriacyl, 2.5% Phenylephrine @ 1:27 PM        Slit Lamp and Fundus Exam    External Exam      Right Left   External Normal Normal       Slit Lamp Exam      Right Left   Lids/Lashes Normal Normal   Conjunctiva/Sclera White and quiet White and quiet  Cornea Clear Clear   Anterior Chamber Deep and quiet Deep and quiet   Iris Round and reactive Round and reactive   Lens Posterior chamber intraocular lens Posterior chamber intraocular lens   Vitreous Normal Normal       Fundus Exam      Right Left   Disc  Normal   C/D Ratio  0.35   Macula  Atrophy, Retinal pigment epithelial atrophy, Age related macular degeneration, Early age related macular degeneration, Drusen, Hard drusen   Vessels  Normal   Periphery  Normal          IMAGING AND PROCEDURES  Imaging and Procedures for 12/20/19  OCT, Retina - OU - Both Eyes       Right Eye Quality was good. Scan locations included subfoveal.   Left Eye Quality was good. Scan locations included subfoveal.                 ASSESSMENT/PLAN:  No problem-specific Assessment & Plan notes found for this encounter.      ICD-10-CM   1. Exudative age-related macular degeneration of left eye with active choroidal neovascularization (HCC)  H35.3221 OCT, Retina - OU - Both Eyes  2. Exudative age-related macular degeneration of right eye with  active choroidal neovascularization (HCC)  H35.3211 OCT, Retina - OU - Both Eyes    1.  2.  3.  Ophthalmic Meds Ordered this visit:  No orders of the defined types were placed in this encounter.      No follow-ups on file.  There are no Patient Instructions on file for this visit.   Explained the diagnoses, plan, and follow up with the patient and they expressed understanding.  Patient expressed understanding of the importance of proper follow up care.   Clent Demark Takeshi Teasdale M.D. Diseases & Surgery of the Retina and Vitreous Retina & Diabetic [place] 12/20/19     Abbreviations: M myopia (nearsighted); A astigmatism; H hyperopia (farsighted); P presbyopia; Mrx spectacle prescription;  CTL contact lenses; OD right eye; OS left eye; OU both eyes  XT exotropia; ET esotropia; PEK punctate epithelial keratitis; PEE punctate epithelial erosions; DES dry eye syndrome; MGD meibomian gland dysfunction; ATs artificial tears; PFAT's preservative free artificial tears; [place] nuclear sclerotic cataract; PSC posterior subcapsular cataract; ERM epi-retinal membrane; PVD posterior vitreous detachment; RD retinal detachment; DM diabetes mellitus; DR diabetic retinopathy; NPDR non-proliferative diabetic retinopathy; PDR proliferative diabetic retinopathy; CSME clinically significant macular edema; DME diabetic macular edema; dbh dot blot hemorrhages; CWS cotton wool spot; POAG primary open angle glaucoma; C/D cup-to-disc ratio; HVF humphrey visual field; GVF goldmann visual field; OCT optical coherence tomography; IOP intraocular pressure; BRVO Branch retinal vein occlusion; CRVO central retinal vein occlusion; CRAO central retinal artery occlusion; BRAO branch retinal artery occlusion; RT retinal tear; SB scleral buckle; PPV pars plana vitrectomy; VH Vitreous hemorrhage; PRP panretinal laser photocoagulation; IVK intravitreal kenalog; VMT vitreomacular traction; MH Macular hole;  NVD neovascularization of  the disc; NVE neovascularization elsewhere; AREDS age related eye disease study; ARMD age related macular degeneration; POAG primary open angle glaucoma; EBMD epithelial/anterior basement membrane dystrophy; ACIOL anterior chamber intraocular lens; IOL intraocular lens; PCIOL posterior chamber intraocular lens; Phaco/IOL phacoemulsification with intraocular lens placement; [place] photorefractive keratectomy; LASIK laser assisted in situ keratomileusis; HTN hypertension; DM diabetes mellitus; COPD chronic obstructive pulmonary disease

## 2019-12-20 NOTE — Assessment & Plan Note (Signed)
The nature of wet macular degeneration was discussed with the patient.  Forms of therapy reviewed include the use of Anti-VEGF medications injected painlessly into the eye, as well as other possible treatment modalities, including thermal laser therapy. Fellow eye involvement and risks were discussed with the patient. Upon the finding of wet age related macular degeneration, treatment will be offered. The treatment regimen is on a treat as needed basis with the intent to treat if necessary and extend interval of exams when possible. On average 1 out of 6 patients do not need lifetime therapy. However, the risk of recurrent disease is high for a lifetime.  Initially monthly, then periodic, examinations and evaluations will determine whether the next treatment is required on the day of the examination.  OD, at 11-week exam interval by OCT today.  Patient is scheduled for examination next week.  OCT discloses active CNVM superior.  Likely need repeat intravitreal Avastin next week

## 2019-12-20 NOTE — Assessment & Plan Note (Signed)
The nature of wet macular degeneration was discussed with the patient.  Forms of therapy reviewed include the use of Anti-VEGF medications injected painlessly into the eye, as well as other possible treatment modalities, including thermal laser therapy. Fellow eye involvement and risks were discussed with the patient. Upon the finding of wet age related macular degeneration, treatment will be offered. The treatment regimen is on a treat as needed basis with the intent to treat if necessary and extend interval of exams when possible. On average 1 out of 6 patients do not need lifetime therapy. However, the risk of recurrent disease is high for a lifetime.  Initially monthly, then periodic, examinations and evaluations will determine whether the next treatment is required on the day of the examination.   OS, improved on intravitreal Avastin currently at 6-week exam interval.  We will repeat injection OS today and examination in 7 weeks

## 2019-12-24 ENCOUNTER — Other Ambulatory Visit: Payer: Self-pay

## 2019-12-24 ENCOUNTER — Ambulatory Visit (INDEPENDENT_AMBULATORY_CARE_PROVIDER_SITE_OTHER): Payer: Medicare Other | Admitting: Ophthalmology

## 2019-12-24 ENCOUNTER — Encounter (INDEPENDENT_AMBULATORY_CARE_PROVIDER_SITE_OTHER): Payer: Self-pay | Admitting: Ophthalmology

## 2019-12-24 DIAGNOSIS — H43821 Vitreomacular adhesion, right eye: Secondary | ICD-10-CM

## 2019-12-24 DIAGNOSIS — H353221 Exudative age-related macular degeneration, left eye, with active choroidal neovascularization: Secondary | ICD-10-CM

## 2019-12-24 DIAGNOSIS — H353211 Exudative age-related macular degeneration, right eye, with active choroidal neovascularization: Secondary | ICD-10-CM

## 2019-12-24 MED ORDER — BEVACIZUMAB CHEMO INJECTION 1.25MG/0.05ML SYRINGE FOR KALEIDOSCOPE
1.2500 mg | INTRAVITREAL | Status: AC | PRN
  Administered 2019-12-24: 1.25 mg via INTRAVITREAL

## 2019-12-24 NOTE — Progress Notes (Signed)
12/24/2019     CHIEF COMPLAINT Patient presents for Retina Follow Up   HISTORY OF PRESENT ILLNESS: Barbara Mccoy is a 86 y.o. female who presents to the clinic today for:   HPI    Retina Follow Up    Patient presents with  Wet AMD.  In right eye.  Severity is moderate.  Duration of 3 months.  Since onset it is gradually improving.  I, the attending physician,  performed the HPI with the patient and updated documentation appropriately.          Comments    3 Month AMD f\u OD. Possible Avastin OD. OCT  Pt states no changes in vision. Pt is seeing floaters in OS after inj.       Last edited by [name] on 12/24/2019  1:18 PM. (History)      Referring physician: No referring provider defined for this encounter.  HISTORICAL INFORMATION:   Selected notes from the MEDICAL RECORD NUMBER       CURRENT MEDICATIONS: No current outpatient medications on file. (Ophthalmic Drugs)   No current facility-administered medications for this visit. (Ophthalmic Drugs)   Current Outpatient Medications (Other)  Medication Sig  . aspirin 81 MG tablet Take 81 mg by mouth daily.  . calcium carbonate (OS-CAL) 600 MG TABS tablet Take 600 mg by mouth 2 (two) times daily with a meal.  . meloxicam (MOBIC) 15 MG tablet Take 1 tablet (15 mg total) by mouth daily. After meals (Patient not taking: Reported on 11/08/2019)  . Multiple Vitamins-Minerals (ICAPS AREDS 2 PO) Take by mouth.  . simvastatin (ZOCOR) 20 MG tablet Take 20 mg by mouth daily.   No current facility-administered medications for this visit. (Other)      REVIEW OF SYSTEMS:    ALLERGIES Allergies  Allergen Reactions  . Codeine Rash    PAST MEDICAL HISTORY History reviewed. No pertinent past medical history. History reviewed. No pertinent surgical history.  FAMILY HISTORY History reviewed. No pertinent family history.  SOCIAL HISTORY Social History   Tobacco Use  . Smoking status: Never Smoker  . Smokeless  tobacco: Never Used  Substance Use Topics  . Alcohol use: Not on file  . Drug use: Not on file         OPHTHALMIC EXAM:  Base Eye Exam    Visual Acuity (Snellen - Linear)      Right Left   Dist cc 20/25 20/25       Tonometry (Tonopen, 1:22 PM)      Right Left   Pressure 15 22       Pupils      Dark Light Shape React APD   Right 4 3 Round Brisk None   Left 4 3 Round Brisk None       Neuro/Psych    Oriented x3: Yes   Mood/Affect: Normal       Dilation    Right eye: 1.0% Mydriacyl, 2.5% Phenylephrine @ 1:23 PM        Slit Lamp and Fundus Exam    External Exam      Right Left   External Normal Normal       Slit Lamp Exam      Right Left   Lids/Lashes Normal Normal   Conjunctiva/Sclera White and quiet White and quiet   Cornea Clear Clear   Anterior Chamber Deep and quiet Deep and quiet   Iris Round and reactive Round and reactive   Lens Posterior chamber  intraocular lens Posterior chamber intraocular lens   Anterior Vitreous Normal Normal       Fundus Exam      Right Left   Posterior Vitreous Posterior vitreous detachment    Disc Normal    C/D Ratio 0.3    Macula Retinal pigment epithelial mottling, no exudates, Hard drusen, Cystoid macular edema, Retinal pigment epithelial atrophy, Early age related macular degeneration           IMAGING AND PROCEDURES  Imaging and Procedures for 12/24/19  OCT, Retina - OU - Both Eyes       Right Eye Quality was good. Scan locations included subfoveal. Central Foveal Thickness: 251. Progression has worsened. Findings include vitreomacular adhesion , cystoid macular edema.   Left Eye Quality was good. Scan locations included subfoveal. Central Foveal Thickness: 225. Progression has been stable. Findings include normal observations, outer retinal atrophy, subretinal scarring, central retinal atrophy.   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  Will need repeat  intravitreal Avastin today and examination in 6 to 7 weeks       Intravitreal Injection, Pharmacologic Agent - OD - Right Eye       Time Out 12/24/2019. 2:30 PM. Confirmed correct patient, procedure, site, and patient consented.   Anesthesia Topical anesthesia was used. Anesthetic medications included Akten 3.5%.   Procedure Preparation included Ofloxacin , 10% betadine to eyelids, 5% betadine to ocular surface. A 30 gauge needle was used.   Injection:  1.25 mg Bevacizumab (AVASTIN) SOLN   NDC: [id number], [id number]   Route: Intravitreal, Site: Right Eye, Waste: 0 mg  Post-op Post injection exam found visual acuity of at least counting fingers. The patient tolerated the procedure well. There were no complications. The patient received written and verbal post procedure care education. Post injection medications were not given.                 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  Examination 6 to 7 weeks OD      ICD-10-CM   1. 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 Exudative age-related macular degeneration of left eye with active choroidal neovascularization (HCC)  H35.3221 OCT, Retina - OU - Both Eyes  3. Vitreomacular adhesion of right eye  H43.821     1.  Intravitreal Avastin OD today  2.  3.  Ophthalmic Meds Ordered this visit:  Meds ordered this encounter  Medications  . Bevacizumab (AVASTIN) SOLN 1.25 mg       Return in about 6 weeks (around 02/04/2020) for OD, AVASTIN OCT.  There are no Patient Instructions on file for this visit.   Explained the  diagnoses, plan, and follow up with the patient and they expressed understanding.  Patient expressed understanding of the importance of proper follow up care.   Clent Demark Charl Wellen M.D. Diseases & Surgery of the Retina and Vitreous Retina & Diabetic [place] 12/24/19     Abbreviations: M myopia (nearsighted); A astigmatism; H hyperopia (farsighted); P presbyopia; Mrx spectacle prescription;  CTL contact lenses; OD right eye; OS left eye; OU both eyes  XT exotropia; ET esotropia;  PEK punctate epithelial keratitis; PEE punctate epithelial erosions; DES dry eye syndrome; MGD meibomian gland dysfunction; ATs artificial tears; PFAT's preservative free artificial tears; [place] nuclear sclerotic cataract; PSC posterior subcapsular cataract; ERM epi-retinal membrane; PVD posterior vitreous detachment; RD retinal detachment; DM diabetes mellitus; DR diabetic retinopathy; NPDR non-proliferative diabetic retinopathy; PDR proliferative diabetic retinopathy; CSME clinically significant macular edema; DME diabetic macular edema; dbh dot blot hemorrhages; CWS cotton wool spot; POAG primary open angle glaucoma; C/D cup-to-disc ratio; HVF humphrey visual field; GVF goldmann visual field; OCT optical coherence tomography; IOP intraocular pressure; BRVO Branch retinal vein occlusion; CRVO central retinal vein occlusion; CRAO central retinal artery occlusion; BRAO branch retinal artery occlusion; RT retinal tear; SB scleral buckle; PPV pars plana vitrectomy; VH Vitreous hemorrhage; PRP panretinal laser photocoagulation; IVK intravitreal kenalog; VMT vitreomacular traction; MH Macular hole;  NVD neovascularization of the disc; NVE neovascularization elsewhere; AREDS age related eye disease study; ARMD age related macular degeneration; POAG primary open angle glaucoma; EBMD epithelial/anterior basement membrane dystrophy; ACIOL anterior chamber intraocular lens; IOL intraocular lens; PCIOL posterior chamber intraocular lens;  Phaco/IOL phacoemulsification with intraocular lens placement; [place] photorefractive keratectomy; LASIK laser assisted in situ keratomileusis; HTN hypertension; DM diabetes mellitus; COPD chronic obstructive pulmonary disease

## 2019-12-24 NOTE — Assessment & Plan Note (Signed)
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  Examination 6 to 7 weeks OD

## 2020-02-07 ENCOUNTER — Encounter (INDEPENDENT_AMBULATORY_CARE_PROVIDER_SITE_OTHER): Payer: Medicare Other | Admitting: Ophthalmology

## 2020-02-12 ENCOUNTER — Ambulatory Visit (INDEPENDENT_AMBULATORY_CARE_PROVIDER_SITE_OTHER): Payer: Medicare Other | Admitting: Ophthalmology

## 2020-02-12 ENCOUNTER — Encounter (INDEPENDENT_AMBULATORY_CARE_PROVIDER_SITE_OTHER): Payer: Self-pay | Admitting: Ophthalmology

## 2020-02-12 ENCOUNTER — Other Ambulatory Visit: Payer: Self-pay

## 2020-02-12 DIAGNOSIS — H353211 Exudative age-related macular degeneration, right eye, with active choroidal neovascularization: Secondary | ICD-10-CM | POA: Diagnosis not present

## 2020-02-12 MED ORDER — BEVACIZUMAB CHEMO INJECTION 1.25MG/0.05ML SYRINGE FOR KALEIDOSCOPE
1.2500 mg | INTRAVITREAL | Status: AC | PRN
  Administered 2020-02-12: 1.25 mg via INTRAVITREAL

## 2020-02-12 NOTE — Progress Notes (Signed)
02/12/2020     CHIEF COMPLAINT Patient presents for Retina Follow Up   HISTORY OF PRESENT ILLNESS: Barbara Mccoy is a 86 y.o. female who presents to the clinic today for:   HPI    Retina Follow Up    Patient presents with  Wet AMD.  In right eye.  This started 7 weeks ago.  Duration of 7 weeks.  Since onset it is stable.          Comments    7 week f/u with possible Avastin OD and OCT(MAC) today.  Pt states she has had more floaters since last visit.        Last edited by Melburn Popper, COA on 02/12/2020 12:57 PM. (History)      Referring physician: Burnard Bunting, MD [place],  [place] [id number]  HISTORICAL INFORMATION:   Selected notes from the [facility]: No current outpatient medications on file. (Ophthalmic Drugs)   No current facility-administered medications for this visit. (Ophthalmic Drugs)   Current Outpatient Medications (Other)  Medication Sig  . aspirin 81 MG tablet Take 81 mg by mouth daily.  . calcium carbonate (OS-CAL) 600 MG TABS tablet Take 600 mg by mouth 2 (two) times daily with a meal.  . meloxicam (MOBIC) 15 MG tablet Take 1 tablet (15 mg total) by mouth daily. After meals (Patient not taking: Reported on 11/08/2019)  . Multiple Vitamins-Minerals (ICAPS AREDS 2 PO) Take by mouth.  . simvastatin (ZOCOR) 20 MG tablet Take 20 mg by mouth daily.   No current facility-administered medications for this visit. (Other)      REVIEW OF SYSTEMS:    ALLERGIES Allergies  Allergen Reactions  . Codeine Rash    PAST MEDICAL HISTORY History reviewed. No pertinent past medical history. History reviewed. No pertinent surgical history.  FAMILY HISTORY History reviewed. No pertinent family history.  SOCIAL HISTORY Social History   Tobacco Use  . Smoking status: Never Smoker  . Smokeless tobacco: Never Used  Substance Use Topics  . Alcohol use: Not on file  . Drug use: Not on file          OPHTHALMIC EXAM:  Base Eye Exam    Visual Acuity (ETDRS)      Right Left   Dist cc 20/30 20/40   Dist ph cc 20/25 20/25 +2   Correction: Glasses       Tonometry (Tonopen, 1:03 PM)      Right Left   Pressure 21 22       Pupils      Pupils Dark Light Shape React APD   Right PERRL 4 3 Round Brisk None   Left PERRL 4 3 Round Brisk None       Visual Fields (Counting fingers)      Left Right    Full Full       Extraocular Movement      Right Left    Full Full       Neuro/Psych    Oriented x3: Yes   Mood/Affect: Normal       Dilation    Right eye: 1.0% Mydriacyl, 2.5% Phenylephrine @ 1:03 PM        Slit Lamp and Fundus Exam    External Exam      Right Left   External Normal Normal       Slit Lamp Exam      Right Left  Lids/Lashes Normal Normal   Conjunctiva/Sclera White and quiet White and quiet   Cornea Clear Clear   Anterior Chamber Deep and quiet Deep and quiet   Iris Round and reactive Round and reactive   Lens Posterior chamber intraocular lens Posterior chamber intraocular lens   Anterior Vitreous Normal Normal       Fundus Exam      Right Left   Posterior Vitreous Posterior vitreous detachment    Disc Normal    C/D Ratio 0.45    Macula Retinal pigment epithelial mottling, no exudates, Hard drusen, Cystoid macular edema, Retinal pigment epithelial atrophy, Early age related macular degeneration, Soft drusen, no macular thickening    Vessels Normal    Periphery Normal           IMAGING AND PROCEDURES  Imaging and Procedures for 02/12/20  OCT, Retina - OU - Both Eyes       Right Eye Quality was good. Scan locations included subfoveal. Central Foveal Thickness: 241. Findings include retinal drusen , vitreomacular adhesion .   Left Eye Quality was good. Scan locations included subfoveal. Central Foveal Thickness: 231. Findings include retinal drusen , vitreomacular adhesion .   Notes Much less subretinal fluid, no intraretinal  fluid and CME as compared to 6 weeks ago in the region superior to the fovea post intravitreal Avastin OD.  Repeat intravitreal Avastin OD today       Intravitreal Injection, Pharmacologic Agent - OD - Right Eye       Time Out 02/12/2020. 1:42 PM. Confirmed correct patient, procedure, site, and patient consented.   Anesthesia Topical anesthesia was used. Anesthetic medications included Akten 3.5%.   Procedure Preparation included Tobramycin 0.3%, 10% betadine to eyelids, 5% betadine to ocular surface. A 30 gauge needle was used.   Injection:  1.25 mg Bevacizumab (AVASTIN) SOLN   NDC: [address]-4, Lot: [id number]   Route: Intravitreal, Site: Right Eye, Waste: 0 mg  Post-op Post injection exam found visual acuity of at least counting fingers. The patient tolerated the procedure well. There were no complications. The patient received written and verbal post procedure care education. Post injection medications were not given.                 ASSESSMENT/PLAN:  Exudative age-related macular degeneration of right eye with active choroidal neovascularization (HCC) Currently at 6 to 7-week exam interval, region superior to the fovea the right eye is improved status post intravitreal Avastin.  We will repeat injection OD today and examination in 6 weeks      ICD-10-CM   1. Exudative age-related macular degeneration of right eye with active choroidal neovascularization (HCC)  H35.3211 OCT, Retina - OU - Both Eyes    Intravitreal Injection, Pharmacologic Agent - OD - Right Eye    Bevacizumab (AVASTIN) SOLN 1.25 mg    1.  2.  3.  Ophthalmic Meds Ordered this visit:  Meds ordered this encounter  Medications  . Bevacizumab (AVASTIN) SOLN 1.25 mg       Return in about 6 weeks (around 03/25/2020) for dilate, OD, AVASTIN OCT.  There are no Patient Instructions on file for this visit.   Explained the diagnoses, plan, and follow up with the patient and they expressed  understanding.  Patient expressed understanding of the importance of proper follow up care.   Clent Demark Chirstina Haan M.D. Diseases & Surgery of the Retina and Vitreous Retina & Diabetic [place] 02/12/20     Abbreviations: M myopia (nearsighted); A astigmatism; H  hyperopia (farsighted); P presbyopia; Mrx spectacle prescription;  CTL contact lenses; OD right eye; OS left eye; OU both eyes  XT exotropia; ET esotropia; PEK punctate epithelial keratitis; PEE punctate epithelial erosions; DES dry eye syndrome; MGD meibomian gland dysfunction; ATs artificial tears; PFAT's preservative free artificial tears; [place] nuclear sclerotic cataract; PSC posterior subcapsular cataract; ERM epi-retinal membrane; PVD posterior vitreous detachment; RD retinal detachment; DM diabetes mellitus; DR diabetic retinopathy; NPDR non-proliferative diabetic retinopathy; PDR proliferative diabetic retinopathy; CSME clinically significant macular edema; DME diabetic macular edema; dbh dot blot hemorrhages; CWS cotton wool spot; POAG primary open angle glaucoma; C/D cup-to-disc ratio; HVF humphrey visual field; GVF goldmann visual field; OCT optical coherence tomography; IOP intraocular pressure; BRVO Branch retinal vein occlusion; CRVO central retinal vein occlusion; CRAO central retinal artery occlusion; BRAO branch retinal artery occlusion; RT retinal tear; SB scleral buckle; PPV pars plana vitrectomy; VH Vitreous hemorrhage; PRP panretinal laser photocoagulation; IVK intravitreal kenalog; VMT vitreomacular traction; MH Macular hole;  NVD neovascularization of the disc; NVE neovascularization elsewhere; AREDS age related eye disease study; ARMD age related macular degeneration; POAG primary open angle glaucoma; EBMD epithelial/anterior basement membrane dystrophy; ACIOL anterior chamber intraocular lens; IOL intraocular lens; PCIOL posterior chamber intraocular lens; Phaco/IOL phacoemulsification with intraocular lens placement; [place]  photorefractive keratectomy; LASIK laser assisted in situ keratomileusis; HTN hypertension; DM diabetes mellitus; COPD chronic obstructive pulmonary disease

## 2020-02-12 NOTE — Assessment & Plan Note (Signed)
Currently at 6 to 7-week exam interval, region superior to the fovea the right eye is improved status post intravitreal Avastin.  We will repeat injection OD today and examination in 6 weeks

## 2020-02-14 ENCOUNTER — Encounter (INDEPENDENT_AMBULATORY_CARE_PROVIDER_SITE_OTHER): Payer: Self-pay | Admitting: Ophthalmology

## 2020-02-14 ENCOUNTER — Other Ambulatory Visit: Payer: Self-pay

## 2020-02-14 ENCOUNTER — Ambulatory Visit (INDEPENDENT_AMBULATORY_CARE_PROVIDER_SITE_OTHER): Payer: Medicare Other | Admitting: Ophthalmology

## 2020-02-14 DIAGNOSIS — H353221 Exudative age-related macular degeneration, left eye, with active choroidal neovascularization: Secondary | ICD-10-CM

## 2020-02-14 MED ORDER — BEVACIZUMAB CHEMO INJECTION 1.25MG/0.05ML SYRINGE FOR KALEIDOSCOPE
1.2500 mg | INTRAVITREAL | Status: AC | PRN
  Administered 2020-02-14: 1.25 mg via INTRAVITREAL

## 2020-02-14 NOTE — Progress Notes (Addendum)
02/14/2020     CHIEF COMPLAINT Patient presents for Retina Follow Up   HISTORY OF PRESENT ILLNESS: Barbara Mccoy is a 86 y.o. female who presents to the clinic today for:   HPI    Retina Follow Up    Patient presents with  Wet AMD.  In left eye.  Duration of 8 weeks.  Since onset it is stable.          Comments    8 week follow up - OCT OU, Poss Avastin OS Patient denies change in vision and overall has no complaints.        Last edited by Gerda Diss on 02/14/2020  1:43 PM. (History)      Referring physician: Burnard Bunting, MD [place],  [place] [id number]  HISTORICAL INFORMATION:   Selected notes from the [place]: No current outpatient medications on file. (Ophthalmic Drugs)   No current facility-administered medications for this visit. (Ophthalmic Drugs)   Current Outpatient Medications (Other)  Medication Sig  . aspirin 81 MG tablet Take 81 mg by mouth daily.  . calcium carbonate (OS-CAL) 600 MG TABS tablet Take 600 mg by mouth 2 (two) times daily with a meal.  . meloxicam (MOBIC) 15 MG tablet Take 1 tablet (15 mg total) by mouth daily. After meals (Patient not taking: Reported on 11/08/2019)  . Multiple Vitamins-Minerals (ICAPS AREDS 2 PO) Take by mouth.  . simvastatin (ZOCOR) 20 MG tablet Take 20 mg by mouth daily.   No current facility-administered medications for this visit. (Other)      REVIEW OF SYSTEMS:    ALLERGIES Allergies  Allergen Reactions  . Codeine Rash    PAST MEDICAL HISTORY History reviewed. No pertinent past medical history. History reviewed. No pertinent surgical history.  FAMILY HISTORY History reviewed. No pertinent family history.  SOCIAL HISTORY Social History   Tobacco Use  . Smoking status: Never Smoker  . Smokeless tobacco: Never Used  Substance Use Topics  . Alcohol use: Not on file  . Drug use: Not on file         OPHTHALMIC EXAM:  Base Eye  Exam    Visual Acuity (Snellen - Linear)      Right Left   Dist cc 20/20-2 20/20-1   Correction: Glasses       Tonometry (Tonopen, 1:49 PM)      Right Left   Pressure 21 16       Pupils      Pupils Dark Light Shape React APD   Right PERRL 4 3 Round Brisk None   Left PERRL 4 3 Round Brisk None       Visual Fields (Counting fingers)      Left Right    Full Full       Extraocular Movement      Right Left    Full Full       Neuro/Psych    Oriented x3: Yes   Mood/Affect: Normal       Dilation    Left eye: 1.0% Mydriacyl, 2.5% Phenylephrine @ 1:49 PM        Slit Lamp and Fundus Exam    External Exam      Right Left   External Normal Normal       Slit Lamp Exam      Right Left   Lids/Lashes Normal Normal   Conjunctiva/Sclera White and quiet White and quiet  Cornea Clear Clear   Anterior Chamber Deep and quiet Deep and quiet   Iris Round and reactive Round and reactive   Lens Posterior chamber intraocular lens Posterior chamber intraocular lens   Anterior Vitreous Normal Normal       Fundus Exam      Right Left   Posterior Vitreous Normal Normal   Disc  Normal   C/D Ratio  0.4   Macula  Atrophy, Retinal pigment epithelial atrophy, Age related macular degeneration, Early age related macular degeneration, Drusen, Soft drusen, no hemorrhage   Vessels  Normal   Periphery  Normal          IMAGING AND PROCEDURES  Imaging and Procedures for 02/14/20  OCT, Retina - OU - Both Eyes       Right Eye Quality was good. Scan locations included subfoveal. Central Foveal Thickness: 239. Progression has been stable. Findings include vitreomacular adhesion , retinal drusen , no SRF, no IRF.   Left Eye Quality was good. Scan locations included subfoveal. Central Foveal Thickness: 226. Progression has been stable. Findings include retinal drusen , vitreomacular adhesion , no IRF, no SRF.   Notes OS, much less CNVM at 700 mg intravitreal Avastin.  We will repeat  injection today and examination in 8 weeks left eye       Intravitreal Injection, Pharmacologic Agent - OD - Right Eye       Time Out 02/14/2020. 2:35 PM. Confirmed correct patient, procedure, site, and patient consented.   Anesthesia Topical anesthesia was used. Anesthetic medications included Akten 3.5%.   Procedure Preparation included 10% betadine to eyelids, 5% betadine to ocular surface, Ofloxacin . A 30 gauge needle was used.   Injection:  1.25 mg Bevacizumab (AVASTIN) SOLN   NDC: [address]-4, Lot: [id number]   Route: Intravitreal, Site: Right Eye, Waste: 0 mg  Post-op Post injection exam found visual acuity of at least counting fingers. The patient tolerated the procedure well. There were no complications. The patient received written and verbal post procedure care education. Post injection medications were not given.        Intravitreal Injection, Pharmacologic Agent - OS - Left Eye       Time Out 02/14/2020. 3:41 PM. Confirmed correct patient, procedure, site, and patient consented.   Anesthesia Topical anesthesia was used. Anesthetic medications included Akten 3.5%.   Procedure Preparation included Tobramycin 0.3%, Ofloxacin , 10% betadine to eyelids, 5% betadine to ocular surface. A 30 gauge needle was used.   Injection:  1.25 mg Bevacizumab (AVASTIN) SOLN   NDC: [address]-2, Lot: [id number]   Route: Intravitreal, Site: Left Eye, Waste: 0 mg  Post-op Post injection exam found visual acuity of at least counting fingers. The patient tolerated the procedure well. There were no complications. The patient received written and verbal post procedure care education. Post injection medications were not given.                 ASSESSMENT/PLAN:  Exudative age-related macular degeneration of left eye with active choroidal neovascularization (HCC) OS stable at the 7-week interval repeat examination left eye in 8 weeks      ICD-10-CM   1. Exudative age-related  macular degeneration of left eye with active choroidal neovascularization (HCC)  H35.3221 OCT, Retina - OU - Both Eyes    Intravitreal Injection, Pharmacologic Agent - OD - Right Eye    Bevacizumab (AVASTIN) SOLN 1.25 mg    Intravitreal Injection, Pharmacologic Agent - OS - Left Eye    Bevacizumab (AVASTIN)  SOLN 1.25 mg    1.OS, is the only eye treated today,    Original posting of procedure on incorrect eye  2.OD, NOT TREATED TODAY   3.  Ophthalmic Meds Ordered this visit:  Meds ordered this encounter  Medications  . Bevacizumab (AVASTIN) SOLN 1.25 mg  . Bevacizumab (AVASTIN) SOLN 1.25 mg       Return in about 8 weeks (around 04/10/2020) for dilate, OS, AVASTIN OCT.  There are no Patient Instructions on file for this visit.   Explained the diagnoses, plan, and follow up with the patient and they expressed understanding.  Patient expressed understanding of the importance of proper follow up care.   Clent Demark Joe Tanney M.D. Diseases & Surgery of the Retina and Vitreous [facility] 02/14/20     Abbreviations: M myopia (nearsighted); A astigmatism; H hyperopia (farsighted); P presbyopia; Mrx spectacle prescription;  CTL contact lenses; OD right eye; OS left eye; OU both eyes  XT exotropia; ET esotropia; PEK punctate epithelial keratitis; PEE punctate epithelial erosions; DES dry eye syndrome; MGD meibomian gland dysfunction; ATs artificial tears; PFAT's preservative free artificial tears; [place] nuclear sclerotic cataract; PSC posterior subcapsular cataract; ERM epi-retinal membrane; PVD posterior vitreous detachment; RD retinal detachment; DM diabetes mellitus; DR diabetic retinopathy; NPDR non-proliferative diabetic retinopathy; PDR proliferative diabetic retinopathy; CSME clinically significant macular edema; DME diabetic macular edema; dbh dot blot hemorrhages; CWS cotton wool spot; POAG primary open angle glaucoma; C/D cup-to-disc ratio; HVF humphrey visual field; GVF  goldmann visual field; OCT optical coherence tomography; IOP intraocular pressure; BRVO Branch retinal vein occlusion; CRVO central retinal vein occlusion; CRAO central retinal artery occlusion; BRAO branch retinal artery occlusion; RT retinal tear; SB scleral buckle; PPV pars plana vitrectomy; VH Vitreous hemorrhage; PRP panretinal laser photocoagulation; IVK intravitreal kenalog; VMT vitreomacular traction; MH Macular hole;  NVD neovascularization of the disc; NVE neovascularization elsewhere; AREDS age related eye disease study; ARMD age related macular degeneration; POAG primary open angle glaucoma; EBMD epithelial/anterior basement membrane dystrophy; ACIOL anterior chamber intraocular lens; IOL intraocular lens; PCIOL posterior chamber intraocular lens; Phaco/IOL phacoemulsification with intraocular lens placement; [place] photorefractive keratectomy; LASIK laser assisted in situ keratomileusis; HTN hypertension; DM diabetes mellitus; COPD chronic obstructive pulmonary disease

## 2020-02-14 NOTE — Assessment & Plan Note (Signed)
OS stable at the 7-week interval repeat examination left eye in 8 weeks

## 2020-03-25 ENCOUNTER — Encounter (INDEPENDENT_AMBULATORY_CARE_PROVIDER_SITE_OTHER): Payer: Self-pay | Admitting: Ophthalmology

## 2020-03-25 ENCOUNTER — Ambulatory Visit (INDEPENDENT_AMBULATORY_CARE_PROVIDER_SITE_OTHER): Payer: Medicare Other | Admitting: Ophthalmology

## 2020-03-25 ENCOUNTER — Other Ambulatory Visit: Payer: Self-pay

## 2020-03-25 DIAGNOSIS — H353211 Exudative age-related macular degeneration, right eye, with active choroidal neovascularization: Secondary | ICD-10-CM

## 2020-03-25 MED ORDER — BEVACIZUMAB CHEMO INJECTION 1.25MG/0.05ML SYRINGE FOR KALEIDOSCOPE
1.2500 mg | INTRAVITREAL | Status: AC | PRN
  Administered 2020-03-25: 1.25 mg via INTRAVITREAL

## 2020-03-25 NOTE — Assessment & Plan Note (Signed)
OD, with recent recurrence of CNVM superior to the fovea, see pictures and exam in May 2021.  Currently at 6-week follow-up and much improved.  We will repeat injection today and in 6 weeks given the recent recurrence

## 2020-03-25 NOTE — Patient Instructions (Signed)
Patient to report new onset visual distortion or decline

## 2020-03-25 NOTE — Progress Notes (Signed)
03/25/2020     CHIEF COMPLAINT Patient presents for Retina Follow Up   HISTORY OF PRESENT ILLNESS: Barbara Mccoy is a 86 y.o. female who presents to the clinic today for:   HPI    Retina Follow Up    Patient presents with  Wet AMD.  In right eye.  This started 6 weeks ago.  Severity is mild.  Duration of 6 weeks.  Since onset it is stable.          Comments    6 Week AMD F/U OD, poss Avastin OD  Pt reports "trash" floating around in [place], but isn't quite sure which eye, possibly OS. Pt c/o difficulty seeing clock by her bed at night. No other changes reported OU.       Last edited by Rockie Neighbours, [place] on 03/25/2020  8:36 AM. (History)      Referring physician: Burnard Bunting, MD [place],   [id number]  HISTORICAL INFORMATION:   Selected notes from the [facility]: No current outpatient medications on file. (Ophthalmic Drugs)   No current facility-administered medications for this visit. (Ophthalmic Drugs)   Current Outpatient Medications (Other)  Medication Sig  . aspirin 81 MG tablet Take 81 mg by mouth daily.  . calcium carbonate (OS-CAL) 600 MG TABS tablet Take 600 mg by mouth 2 (two) times daily with a meal.  . meloxicam (MOBIC) 15 MG tablet Take 1 tablet (15 mg total) by mouth daily. After meals (Patient not taking: Reported on 11/08/2019)  . Multiple Vitamins-Minerals (ICAPS AREDS 2 PO) Take by mouth.  . simvastatin (ZOCOR) 20 MG tablet Take 20 mg by mouth daily.   No current facility-administered medications for this visit. (Other)      REVIEW OF SYSTEMS:    ALLERGIES Allergies  Allergen Reactions  . Codeine Rash    PAST MEDICAL HISTORY History reviewed. No pertinent past medical history. History reviewed. No pertinent surgical history.  FAMILY HISTORY History reviewed. No pertinent family history.  SOCIAL HISTORY Social History   Tobacco Use  . Smoking status: Never Smoker  .  Smokeless tobacco: Never Used  Substance Use Topics  . Alcohol use: Not on file  . Drug use: Not on file         OPHTHALMIC EXAM:  Base Eye Exam    Visual Acuity (ETDRS)      Right Left   Dist cc 20/20 20/20 -2   Correction: Glasses       Tonometry (Tonopen, 8:37 AM)      Right Left   Pressure 19 19       Pupils      Pupils Dark Light Shape React APD   Right PERRL 4 3 Round Brisk None   Left PERRL 4 3 Round Brisk None       Visual Fields (Counting fingers)      Left Right    Full Full       Extraocular Movement      Right Left    Full Full       Neuro/Psych    Oriented x3: Yes   Mood/Affect: Normal       Dilation    Right eye: 1.0% Mydriacyl, 2.5% Phenylephrine @ 8:39 AM        Slit Lamp and Fundus Exam    External Exam      Right Left   External Normal Normal  Slit Lamp Exam      Right Left   Lids/Lashes Normal Normal   Conjunctiva/Sclera White and quiet White and quiet   Cornea Clear Clear   Anterior Chamber Deep and quiet Deep and quiet   Iris Round and reactive Round and reactive   Lens Posterior chamber intraocular lens Posterior chamber intraocular lens   Anterior Vitreous Normal Normal       Fundus Exam      Right Left   Posterior Vitreous Posterior vitreous detachment    Disc Normal    C/D Ratio 0.45    Macula Retinal pigment epithelial mottling, no exudates, Hard drusen, , Retinal pigment epithelial atrophy, Early age related macular degeneration, Soft drusen, no macular thickening    Vessels Normal    Periphery Normal           IMAGING AND PROCEDURES  Imaging and Procedures for 03/25/20  OCT, Retina - OU - Both Eyes       Right Eye Quality was good. Scan locations included subfoveal. Central Foveal Thickness: 232. Progression has improved. Findings include vitreomacular adhesion , abnormal foveal contour, retinal drusen .   Left Eye Quality was good. Scan locations included subfoveal. Central Foveal Thickness:  226. Progression has improved. Findings include vitreomacular adhesion , abnormal foveal contour, retinal drusen .   Notes OD, macular much improved, less active CNVM superior to the foveal region as compared to May 2021       Intravitreal Injection, Pharmacologic Agent - OD - Right Eye       Time Out 03/25/2020. 9:23 AM. Confirmed correct patient, procedure, site, and patient consented.   Anesthesia Topical anesthesia was used. Anesthetic medications included Akten 3.5%.   Procedure Preparation included 5% betadine to ocular surface, 10% betadine to eyelids, Tobramycin 0.3%. A supplied needle was used.   Injection:  1.25 mg Bevacizumab (AVASTIN) SOLN   NDC: [address]-6   Route: Intravitreal, Site: Right Eye, Waste: 0 mg  Post-op Post injection exam found visual acuity of at least counting fingers. The patient tolerated the procedure well. There were no complications. The patient received written and verbal post procedure care education. Post injection medications were not given.                 ASSESSMENT/PLAN:  Exudative age-related macular degeneration of right eye with active choroidal neovascularization (HCC) OD, with recent recurrence of CNVM superior to the fovea, see pictures and exam in May 2021.  Currently at 6-week follow-up and much improved.  We will repeat injection today and in 6 weeks given the recent recurrence      ICD-10-CM   1. Exudative age-related macular degeneration of right eye with active choroidal neovascularization (HCC)  H35.3211 OCT, Retina - OU - Both Eyes    Intravitreal Injection, Pharmacologic Agent - OD - Right Eye    Bevacizumab (AVASTIN) SOLN 1.25 mg    1.  2.  3.  Ophthalmic Meds Ordered this visit:  Meds ordered this encounter  Medications  . Bevacizumab (AVASTIN) SOLN 1.25 mg       Return in about 6 weeks (around 05/06/2020) for dilate, OD, AVASTIN OCT.  Patient Instructions  Patient to report new onset visual  distortion or decline    Explained the diagnoses, plan, and follow up with the patient and they expressed understanding.  Patient expressed understanding of the importance of proper follow up care.   Clent Demark Makel Mcmann M.D. Diseases & Surgery of the Retina and Vitreous [facility] 03/25/20     Abbreviations: M myopia (nearsighted); A astigmatism; H hyperopia (farsighted); P presbyopia; Mrx spectacle prescription;  CTL contact lenses; OD right eye; OS left eye; OU both eyes  XT exotropia; ET esotropia; PEK punctate epithelial keratitis; PEE punctate epithelial erosions; DES dry eye syndrome; MGD meibomian gland dysfunction; ATs artificial tears; PFAT's preservative free artificial tears; [place] nuclear sclerotic cataract; PSC posterior subcapsular cataract; ERM epi-retinal membrane; PVD posterior vitreous detachment; RD retinal detachment; DM diabetes mellitus; DR diabetic retinopathy; NPDR non-proliferative diabetic retinopathy; PDR proliferative diabetic retinopathy; CSME clinically significant macular edema; DME diabetic macular edema; dbh dot blot hemorrhages; CWS cotton wool spot; POAG primary open angle glaucoma; C/D cup-to-disc ratio; HVF humphrey visual field; GVF goldmann visual field; OCT optical coherence tomography; IOP intraocular pressure; BRVO Branch retinal vein occlusion; CRVO central retinal vein occlusion; CRAO central retinal artery occlusion; BRAO branch retinal artery occlusion; RT retinal tear; SB scleral buckle; PPV pars plana vitrectomy; VH Vitreous hemorrhage; PRP panretinal laser photocoagulation; IVK intravitreal kenalog; VMT vitreomacular traction; MH Macular hole;  NVD neovascularization of the disc; NVE neovascularization elsewhere; AREDS age related eye disease study; ARMD age related macular degeneration; POAG primary open angle glaucoma; EBMD epithelial/anterior basement membrane dystrophy; ACIOL anterior chamber intraocular lens; IOL intraocular lens; PCIOL  posterior chamber intraocular lens; Phaco/IOL phacoemulsification with intraocular lens placement; [place] photorefractive keratectomy; LASIK laser assisted in situ keratomileusis; HTN hypertension; DM diabetes mellitus; COPD chronic obstructive pulmonary disease

## 2020-03-31 ENCOUNTER — Encounter (INDEPENDENT_AMBULATORY_CARE_PROVIDER_SITE_OTHER): Payer: Medicare Other | Admitting: Ophthalmology

## 2020-03-31 DIAGNOSIS — H5310 Unspecified subjective visual disturbances: Secondary | ICD-10-CM | POA: Diagnosis not present

## 2020-03-31 DIAGNOSIS — H26493 Other secondary cataract, bilateral: Secondary | ICD-10-CM | POA: Diagnosis not present

## 2020-03-31 DIAGNOSIS — H353231 Exudative age-related macular degeneration, bilateral, with active choroidal neovascularization: Secondary | ICD-10-CM | POA: Diagnosis not present

## 2020-04-01 ENCOUNTER — Other Ambulatory Visit: Payer: Self-pay

## 2020-04-01 ENCOUNTER — Other Ambulatory Visit (HOSPITAL_COMMUNITY): Payer: Self-pay | Admitting: Ophthalmology

## 2020-04-01 ENCOUNTER — Ambulatory Visit (HOSPITAL_COMMUNITY)
Admission: RE | Admit: 2020-04-01 | Discharge: 2020-04-01 | Disposition: A | Discharge status: Home / Self Care | Payer: Medicare Other | Source: Ambulatory Visit | Attending: Vascular Surgery | Admitting: Vascular Surgery

## 2020-04-01 DIAGNOSIS — H539 Unspecified visual disturbance: Secondary | ICD-10-CM | POA: Diagnosis not present

## 2020-04-10 ENCOUNTER — Encounter (INDEPENDENT_AMBULATORY_CARE_PROVIDER_SITE_OTHER): Payer: Medicare Other | Admitting: Ophthalmology

## 2020-04-10 ENCOUNTER — Ambulatory Visit (INDEPENDENT_AMBULATORY_CARE_PROVIDER_SITE_OTHER): Payer: Medicare Other | Admitting: Ophthalmology

## 2020-04-10 ENCOUNTER — Encounter (INDEPENDENT_AMBULATORY_CARE_PROVIDER_SITE_OTHER): Payer: Self-pay | Admitting: Ophthalmology

## 2020-04-10 ENCOUNTER — Other Ambulatory Visit: Payer: Self-pay

## 2020-04-10 DIAGNOSIS — G43801 Other migraine, not intractable, with status migrainosus: Secondary | ICD-10-CM | POA: Diagnosis not present

## 2020-04-10 DIAGNOSIS — H353221 Exudative age-related macular degeneration, left eye, with active choroidal neovascularization: Secondary | ICD-10-CM

## 2020-04-10 MED ORDER — BEVACIZUMAB CHEMO INJECTION 1.25MG/0.05ML SYRINGE FOR KALEIDOSCOPE
1.2500 mg | INTRAVITREAL | Status: AC | PRN
  Administered 2020-04-10: 1.25 mg via INTRAVITREAL

## 2020-04-10 NOTE — Assessment & Plan Note (Signed)
Patient had recent evaluation for vision loss in the left eye with Dr. 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  Carotid Doppler studies were normal.  I did encourage the patient to consider taking 1 magnesium supplement a day as it might stabilize the microvasculature in this region of her brain, in addition may prevent her from having leg cramps which coincidentally she informs me she does indeed have.

## 2020-04-10 NOTE — Patient Instructions (Signed)
Patient report any new onset visual acuity distortions or declines in either eye

## 2020-04-10 NOTE — Progress Notes (Signed)
04/10/2020     CHIEF COMPLAINT Patient presents for Retina Follow Up   HISTORY OF PRESENT ILLNESS: Barbara Mccoy is a 86 y.o. female who presents to the clinic today for:   HPI    Retina Follow Up    Patient presents with  Wet AMD.  In left eye.  This started 8 weeks ago.  Severity is mild.  Duration of 8 weeks.  Since onset it is stable.          Comments    8 Week AMD F/U OS, poss Avastin OS  Pt sts she had an ophthalmic migraine x 3 days ago affecting VA OS for approx 4 hours. Pt sts she saw Dr. Ellie Lunch who diagnosed the migraine, and she had carotid doppler done which was normal. Pt denies any other new symptoms, and sts VA is back to normal now.       Last edited by Rockie Neighbours, [place] on 04/10/2020  1:07 PM. (History)      Referring physician: Burnard Bunting, MD [place],  [place] [address]  HISTORICAL INFORMATION:   Selected notes from the [facility]: No current outpatient medications on file. (Ophthalmic Drugs)   No current facility-administered medications for this visit. (Ophthalmic Drugs)   Current Outpatient Medications (Other)  Medication Sig  . aspirin 81 MG tablet Take 81 mg by mouth daily.  . calcium carbonate (OS-CAL) 600 MG TABS tablet Take 600 mg by mouth 2 (two) times daily with a meal.  . meloxicam (MOBIC) 15 MG tablet Take 1 tablet (15 mg total) by mouth daily. After meals (Patient not taking: Reported on 11/08/2019)  . Multiple Vitamins-Minerals (ICAPS AREDS 2 PO) Take by mouth.  . simvastatin (ZOCOR) 20 MG tablet Take 20 mg by mouth daily.   No current facility-administered medications for this visit. (Other)      REVIEW OF SYSTEMS:    ALLERGIES Allergies  Allergen Reactions  . Codeine Rash    PAST MEDICAL HISTORY History reviewed. No pertinent past medical history. History reviewed. No pertinent surgical history.  FAMILY HISTORY History reviewed. No pertinent family  history.  SOCIAL HISTORY Social History   Tobacco Use  . Smoking status: Never Smoker  . Smokeless tobacco: Never Used  Substance Use Topics  . Alcohol use: Not on file  . Drug use: Not on file         OPHTHALMIC EXAM: Base Eye Exam    Visual Acuity (ETDRS)      Right Left   Dist cc 20/30 20/20 -1   Dist ph cc 20/20 -1    Correction: Glasses       Tonometry (Tonopen, 1:09 PM)      Right Left   Pressure 17 18       Pupils      Pupils Dark Light Shape React APD   Right PERRL 4 3 Round Brisk None   Left PERRL 4 3 Round Brisk None       Visual Fields (Counting fingers)      Left Right    Full Full       Extraocular Movement      Right Left    Full Full       Neuro/Psych    Oriented x3: Yes   Mood/Affect: Normal       Dilation    Left eye: 1.0% Mydriacyl, 2.5% Phenylephrine @ 1:12 PM  Slit Lamp and Fundus Exam    External Exam      Right Left   External Normal Normal       Slit Lamp Exam      Right Left   Lids/Lashes Normal Normal   Conjunctiva/Sclera White and quiet White and quiet   Cornea Clear Clear   Anterior Chamber Deep and quiet Deep and quiet   Iris Round and reactive Round and reactive   Lens Posterior chamber intraocular lens Posterior chamber intraocular lens   Anterior Vitreous Normal Normal       Fundus Exam      Right Left   Posterior Vitreous  Normal   Disc  Normal   C/D Ratio  0.4   Macula  Atrophy, Retinal pigment epithelial atrophy, Age related macular degeneration, Early age related macular degeneration, Drusen, Soft drusen, no hemorrhage   Vessels  Normal   Periphery  Normal          IMAGING AND PROCEDURES  Imaging and Procedures for 04/10/20  OCT, Retina - OU - Both Eyes       Right Eye Quality was good. Scan locations included subfoveal. Central Foveal Thickness: 234. Progression has improved. Findings include vitreomacular adhesion , abnormal foveal contour, retinal drusen .   Left Eye Quality  was good. Scan locations included subfoveal. Central Foveal Thickness: 221. Progression has improved. Findings include vitreomacular adhesion , abnormal foveal contour, retinal drusen .   Notes OS, improved anatomy currently at 8-week follow-up.  Will repeat injection intravitreal Avastin OS today and examination in 10 weeks for the left eye  The right eye now 2 weeks status post injection, anatomy is also improved and stabilized       Intravitreal Injection, Pharmacologic Agent - OS - Left Eye       Time Out 04/10/2020. 1:46 PM. Confirmed correct patient, procedure, site, and patient consented.   Anesthesia Topical anesthesia was used. Anesthetic medications included Akten 3.5%.   Procedure Preparation included Tobramycin 0.3%, Ofloxacin , 10% betadine to eyelids, 5% betadine to ocular surface. A supplied needle was used.   Injection:  1.25 mg Bevacizumab (AVASTIN) SOLN   NDC: [address]-0, Lot: [id number]   Route: Intravitreal, Site: Left Eye, Waste: 0 mg  Post-op Post injection exam found visual acuity of at least counting fingers. The patient tolerated the procedure well. There were no complications. The patient received written and verbal post procedure care education. Post injection medications were not given.   Notes The left eye treated today                 ASSESSMENT/PLAN:  Ocular migraine with status migrainosus, not intractable Patient had recent evaluation for vision loss in the left eye with Dr. 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  Carotid Doppler studies were normal.  I did encourage the patient to consider taking 1 magnesium supplement a day as it might stabilize the microvasculature in this region of her brain, in addition may prevent her from having leg cramps which coincidentally she informs me she does indeed have.        ICD-10-CM   1. Exudative age-related macular degeneration of left eye with active choroidal  neovascularization (HCC)  H35.3221 OCT, Retina - OU - Both Eyes    Intravitreal Injection, Pharmacologic Agent - OS - Left Eye    Bevacizumab (AVASTIN) SOLN 1.25 mg  2. Ocular migraine with status migrainosus, not intractable  G43.801  1.  Repeat intravitreal Avastin OS today at 8-week follow-up in examination left eye in 8 weeks  2.  Patient to commence with oral magnesium 40 mg over-the-counter supplement daily.  I also suggested she might use Rolaids which has calcium and magnesium on a as needed basis.  3.  OD follow-up as scheduled  Ophthalmic Meds Ordered this visit:  Meds ordered this encounter  Medications  . Bevacizumab (AVASTIN) SOLN 1.25 mg       Return in about 8 weeks (around 06/05/2020) for dilate, OS.  There are no Patient Instructions on file for this visit.   Explained the diagnoses, plan, and follow up with the patient and they expressed understanding.  Patient expressed understanding of the importance of proper follow up care.   Clent Demark Abigail Marsiglia M.D. Diseases & Surgery of the Retina and Vitreous Retina & Diabetic [place] 04/10/20     Abbreviations: M myopia (nearsighted); A astigmatism; H hyperopia (farsighted); P presbyopia; Mrx spectacle prescription;  CTL contact lenses; OD right eye; OS left eye; OU both eyes  XT exotropia; ET esotropia; PEK punctate epithelial keratitis; PEE punctate epithelial erosions; DES dry eye syndrome; MGD meibomian gland dysfunction; ATs artificial tears; PFAT's preservative free artificial tears; [place] nuclear sclerotic cataract; PSC posterior subcapsular cataract; ERM epi-retinal membrane; PVD posterior vitreous detachment; RD retinal detachment; DM diabetes mellitus; DR diabetic retinopathy; NPDR non-proliferative diabetic retinopathy; PDR proliferative diabetic retinopathy; CSME clinically significant macular edema; DME diabetic macular edema; dbh dot blot hemorrhages; CWS cotton wool spot; POAG primary open angle glaucoma; C/D  cup-to-disc ratio; HVF humphrey visual field; GVF goldmann visual field; OCT optical coherence tomography; IOP intraocular pressure; BRVO Branch retinal vein occlusion; CRVO central retinal vein occlusion; CRAO central retinal artery occlusion; BRAO branch retinal artery occlusion; RT retinal tear; SB scleral buckle; PPV pars plana vitrectomy; VH Vitreous hemorrhage; PRP panretinal laser photocoagulation; IVK intravitreal kenalog; VMT vitreomacular traction; MH Macular hole;  NVD neovascularization of the disc; NVE neovascularization elsewhere; AREDS age related eye disease study; ARMD age related macular degeneration; POAG primary open angle glaucoma; EBMD epithelial/anterior basement membrane dystrophy; ACIOL anterior chamber intraocular lens; IOL intraocular lens; PCIOL posterior chamber intraocular lens; Phaco/IOL phacoemulsification with intraocular lens placement; [place] photorefractive keratectomy; LASIK laser assisted in situ keratomileusis; HTN hypertension; DM diabetes mellitus; COPD chronic obstructive pulmonary disease

## 2020-05-06 ENCOUNTER — Encounter (INDEPENDENT_AMBULATORY_CARE_PROVIDER_SITE_OTHER): Payer: Self-pay | Admitting: Ophthalmology

## 2020-05-06 ENCOUNTER — Ambulatory Visit (INDEPENDENT_AMBULATORY_CARE_PROVIDER_SITE_OTHER): Payer: Medicare Other | Admitting: Ophthalmology

## 2020-05-06 ENCOUNTER — Other Ambulatory Visit: Payer: Self-pay

## 2020-05-06 DIAGNOSIS — H353211 Exudative age-related macular degeneration, right eye, with active choroidal neovascularization: Secondary | ICD-10-CM

## 2020-05-06 MED ORDER — BEVACIZUMAB CHEMO INJECTION 1.25MG/0.05ML SYRINGE FOR KALEIDOSCOPE
1.2500 mg | INTRAVITREAL | Status: AC | PRN
  Administered 2020-05-06: 1.25 mg via INTRAVITREAL

## 2020-05-06 NOTE — Progress Notes (Signed)
05/06/2020     CHIEF COMPLAINT Patient presents for Retina Follow Up   HISTORY OF PRESENT ILLNESS: Barbara Mccoy is a 86 y.o. female who presents to the clinic today for:   HPI    Retina Follow Up    Patient presents with  Wet AMD.  In right eye.  Severity is moderate.  Duration of 6 weeks.  Since onset it is stable.  I, the attending physician,  performed the HPI with the patient and updated documentation appropriately.          Comments    6 Week Wet AMD f\u OD. Possible Avastin OD. OCT  Pt states OS vision is worse than OD vision. Pt has seen multiple floaters but they are starting to clear.       Last edited by [name] on 05/06/2020  8:30 AM. (History)      Referring physician: Burnard Bunting, MD [address] [place],  [place] [address]  HISTORICAL INFORMATION:   Selected notes from the [facility]: No current outpatient medications on file. (Ophthalmic Drugs)   No current facility-administered medications for this visit. (Ophthalmic Drugs)   Current Outpatient Medications (Other)  Medication Sig  . aspirin 81 MG tablet Take 81 mg by mouth daily.  . calcium carbonate (OS-CAL) 600 MG TABS tablet Take 600 mg by mouth 2 (two) times daily with a meal.  . meloxicam (MOBIC) 15 MG tablet Take 1 tablet (15 mg total) by mouth daily. After meals (Patient not taking: Reported on 11/08/2019)  . Multiple Vitamins-Minerals (ICAPS AREDS 2 PO) Take by mouth.  . simvastatin (ZOCOR) 20 MG tablet Take 20 mg by mouth daily.   No current facility-administered medications for this visit. (Other)      REVIEW OF SYSTEMS:    ALLERGIES Allergies  Allergen Reactions  . Codeine Rash    PAST MEDICAL HISTORY History reviewed. No pertinent past medical history. History reviewed. No pertinent surgical history.  FAMILY HISTORY History reviewed. No pertinent family history.  SOCIAL HISTORY Social History   Tobacco Use  .  Smoking status: Never Smoker  . Smokeless tobacco: Never Used  Substance Use Topics  . Alcohol use: Not on file  . Drug use: Not on file         OPHTHALMIC EXAM:  Base Eye Exam    Visual Acuity (Snellen - Linear)      Right Left   Dist cc 20/20 -1 20/25 -1   Correction: Glasses       Tonometry (Tonopen, 8:35 AM)      Right Left   Pressure 11 14       Pupils      Pupils Dark Light Shape React APD   Right PERRL 4 3 Round Brisk None   Left PERRL 4 3 Round Brisk None       Visual Fields (Counting fingers)      Left Right    Full Full       Neuro/Psych    Oriented x3: Yes   Mood/Affect: Normal       Dilation    Right eye: 1.0% Mydriacyl, 2.5% Phenylephrine @ 8:35 AM        Slit Lamp and Fundus Exam    External Exam      Right Left   External Normal Normal       Slit Lamp Exam      Right Left   Lids/Lashes  Normal Normal   Conjunctiva/Sclera White and quiet White and quiet   Cornea Clear Clear   Anterior Chamber Deep and quiet Deep and quiet   Iris Round and reactive Round and reactive   Lens Posterior chamber intraocular lens Posterior chamber intraocular lens   Anterior Vitreous Normal Normal       Fundus Exam      Right Left   Posterior Vitreous Posterior vitreous detachment    Disc Normal    C/D Ratio 0.45    Macula Retinal pigment epithelial mottling, no exudates, Hard drusen, , Retinal pigment epithelial atrophy, Early age related macular degeneration, Soft drusen, no macular thickening    Vessels Normal    Periphery Normal           IMAGING AND PROCEDURES  Imaging and Procedures for 05/06/20  OCT, Retina - OU - Both Eyes       Right Eye Quality was good. Scan locations included subfoveal. Central Foveal Thickness: 240. Progression has improved. Findings include abnormal foveal contour, vitreomacular adhesion .   Left Eye Quality was good. Scan locations included subfoveal. Central Foveal Thickness: 230. Progression has been stable.  Findings include abnormal foveal contour, vitreomacular adhesion .   Notes Much less retinal thickening right eye, at 2-week follow-up interval post Avastin for CNVM. We will repeat injection today and extend interval examination of right eye to 8 weeks       Intravitreal Injection, Pharmacologic Agent - OD - Right Eye       Time Out 05/06/2020. 9:03 AM. Confirmed correct patient, procedure, site, and patient consented.   Anesthesia Topical anesthesia was used. Anesthetic medications included Akten 3.5%.   Procedure Preparation included Tobramycin 0.3%, Ofloxacin , 10% betadine to eyelids, 5% betadine to ocular surface. A 30 gauge needle was used.   Injection:  1.25 mg Bevacizumab (AVASTIN) SOLN   NDC: 70360-001-02, Lot: [id number]   Route: Intravitreal, Site: Right Eye, Waste: 0 mg  Post-op Post injection exam found visual acuity of at least counting fingers. The patient tolerated the procedure well. There were no complications. The patient received written and verbal post procedure care education. Post injection medications were not given.                 ASSESSMENT/PLAN:  Exudative age-related macular degeneration of right eye with active choroidal neovascularization (HCC) OD maintain improvement in stability of findings of CNVM in the past currently at 6-week interval post Avastin, will repeat injection today and examination right eye in 7 weeks      ICD-10-CM   1. Exudative age-related macular degeneration of right eye with active choroidal neovascularization (HCC)  H35.3211 OCT, Retina - OU - Both Eyes    Intravitreal Injection, Pharmacologic Agent - OD - Right Eye    Bevacizumab (AVASTIN) SOLN 1.25 mg    1.  2.  3.  Ophthalmic Meds Ordered this visit:  Meds ordered this encounter  Medications  . Bevacizumab (AVASTIN) SOLN 1.25 mg       Return in about 7 weeks (around 06/24/2020) for dilate, AVASTIN OCT, OD.  Patient Instructions  No onset visual  acuity declines or distortions    Explained the diagnoses, plan, and follow up with the patient and they expressed understanding.  Patient expressed understanding of the importance of proper follow up care.   Clent Demark Patrick Sohm M.D. Diseases & Surgery of the Retina and Vitreous Retina & Diabetic [place] 05/06/20     Abbreviations: M myopia (nearsighted); A astigmatism; H  hyperopia (farsighted); P presbyopia; Mrx spectacle prescription;  CTL contact lenses; OD right eye; OS left eye; OU both eyes  XT exotropia; ET esotropia; PEK punctate epithelial keratitis; PEE punctate epithelial erosions; DES dry eye syndrome; MGD meibomian gland dysfunction; ATs artificial tears; PFAT's preservative free artificial tears; [place] nuclear sclerotic cataract; PSC posterior subcapsular cataract; ERM epi-retinal membrane; PVD posterior vitreous detachment; RD retinal detachment; DM diabetes mellitus; DR diabetic retinopathy; NPDR non-proliferative diabetic retinopathy; PDR proliferative diabetic retinopathy; CSME clinically significant macular edema; DME diabetic macular edema; dbh dot blot hemorrhages; CWS cotton wool spot; POAG primary open angle glaucoma; C/D cup-to-disc ratio; HVF humphrey visual field; GVF goldmann visual field; OCT optical coherence tomography; IOP intraocular pressure; BRVO Branch retinal vein occlusion; CRVO central retinal vein occlusion; CRAO central retinal artery occlusion; BRAO branch retinal artery occlusion; RT retinal tear; SB scleral buckle; PPV pars plana vitrectomy; VH Vitreous hemorrhage; PRP panretinal laser photocoagulation; IVK intravitreal kenalog; VMT vitreomacular traction; MH Macular hole;  NVD neovascularization of the disc; NVE neovascularization elsewhere; AREDS age related eye disease study; ARMD age related macular degeneration; POAG primary open angle glaucoma; EBMD epithelial/anterior basement membrane dystrophy; ACIOL anterior chamber intraocular lens; IOL intraocular  lens; PCIOL posterior chamber intraocular lens; Phaco/IOL phacoemulsification with intraocular lens placement; [place] photorefractive keratectomy; LASIK laser assisted in situ keratomileusis; HTN hypertension; DM diabetes mellitus; COPD chronic obstructive pulmonary disease

## 2020-05-06 NOTE — Assessment & Plan Note (Signed)
OD maintain improvement in stability of findings of CNVM in the past currently at 6-week interval post Avastin, will repeat injection today and examination right eye in 7 weeks

## 2020-05-06 NOTE — Patient Instructions (Signed)
No onset visual acuity declines or distortions

## 2020-05-22 DIAGNOSIS — M859 Disorder of bone density and structure, unspecified: Secondary | ICD-10-CM | POA: Diagnosis not present

## 2020-05-22 DIAGNOSIS — R7301 Impaired fasting glucose: Secondary | ICD-10-CM | POA: Diagnosis not present

## 2020-05-22 DIAGNOSIS — E785 Hyperlipidemia, unspecified: Secondary | ICD-10-CM | POA: Diagnosis not present

## 2020-05-28 DIAGNOSIS — K219 Gastro-esophageal reflux disease without esophagitis: Secondary | ICD-10-CM | POA: Diagnosis not present

## 2020-05-28 DIAGNOSIS — Z23 Encounter for immunization: Secondary | ICD-10-CM | POA: Diagnosis not present

## 2020-05-28 DIAGNOSIS — K581 Irritable bowel syndrome with constipation: Secondary | ICD-10-CM | POA: Diagnosis not present

## 2020-05-28 DIAGNOSIS — R7301 Impaired fasting glucose: Secondary | ICD-10-CM | POA: Diagnosis not present

## 2020-05-28 DIAGNOSIS — E785 Hyperlipidemia, unspecified: Secondary | ICD-10-CM | POA: Diagnosis not present

## 2020-05-28 DIAGNOSIS — E663 Overweight: Secondary | ICD-10-CM | POA: Diagnosis not present

## 2020-05-28 DIAGNOSIS — Z0000 Encounter for general adult medical examination without abnormal findings: Secondary | ICD-10-CM | POA: Diagnosis not present

## 2020-05-28 DIAGNOSIS — R82998 Other abnormal findings in urine: Secondary | ICD-10-CM | POA: Diagnosis not present

## 2020-06-09 ENCOUNTER — Encounter (INDEPENDENT_AMBULATORY_CARE_PROVIDER_SITE_OTHER): Payer: Self-pay | Admitting: Ophthalmology

## 2020-06-09 ENCOUNTER — Ambulatory Visit (INDEPENDENT_AMBULATORY_CARE_PROVIDER_SITE_OTHER): Payer: Medicare Other | Admitting: Ophthalmology

## 2020-06-09 ENCOUNTER — Encounter (INDEPENDENT_AMBULATORY_CARE_PROVIDER_SITE_OTHER): Payer: Medicare Other | Admitting: Ophthalmology

## 2020-06-09 ENCOUNTER — Other Ambulatory Visit: Payer: Self-pay

## 2020-06-09 DIAGNOSIS — H353211 Exudative age-related macular degeneration, right eye, with active choroidal neovascularization: Secondary | ICD-10-CM | POA: Diagnosis not present

## 2020-06-09 DIAGNOSIS — H353221 Exudative age-related macular degeneration, left eye, with active choroidal neovascularization: Secondary | ICD-10-CM | POA: Diagnosis not present

## 2020-06-09 MED ORDER — BEVACIZUMAB CHEMO INJECTION 1.25MG/0.05ML SYRINGE FOR KALEIDOSCOPE
1.2500 mg | INTRAVITREAL | Status: AC | PRN
  Administered 2020-06-09: 1.25 mg via INTRAVITREAL

## 2020-06-09 NOTE — Assessment & Plan Note (Signed)
Stable OD at 1 month follow-up on OCT evaluation

## 2020-06-09 NOTE — Progress Notes (Signed)
06/09/2020     CHIEF COMPLAINT Patient presents for Retina Follow Up   HISTORY OF PRESENT ILLNESS: Barbara Mccoy is a 86 y.o. female who presents to the clinic today for:   HPI    Retina Follow Up    Patient presents with  Wet AMD.  In left eye.  Severity is moderate.  Duration of 8 weeks.  Since onset it is stable.  I, the attending physician,  performed the HPI with the patient and updated documentation appropriately.          Comments    8 Week Wet AMD f\u OS. Possible Avastin OS. OCT  Pt states no changes in vision. Denies any complaints.       Last edited by [name] on 06/09/2020  2:49 PM. (History)      Referring physician: Burnard Bunting, MD [address] [place],  [place] [address]  HISTORICAL INFORMATION:   Selected notes from the [place] [facility]: No current outpatient medications on file. (Ophthalmic Drugs)   No current facility-administered medications for this visit. (Ophthalmic Drugs)   Current Outpatient Medications (Other)  Medication Sig  . aspirin 81 MG tablet Take 81 mg by mouth daily.  . calcium carbonate (OS-CAL) 600 MG TABS tablet Take 600 mg by mouth 2 (two) times daily with a meal.  . meloxicam (MOBIC) 15 MG tablet Take 1 tablet (15 mg total) by mouth daily. After meals (Patient not taking: Reported on 11/08/2019)  . Multiple Vitamins-Minerals (ICAPS AREDS 2 PO) Take by mouth.  . simvastatin (ZOCOR) 20 MG tablet Take 20 mg by mouth daily.   No current facility-administered medications for this visit. (Other)      REVIEW OF SYSTEMS:    ALLERGIES Allergies  Allergen Reactions  . Codeine Rash    PAST MEDICAL HISTORY History reviewed. No pertinent past medical history. History reviewed. No pertinent surgical history.  FAMILY HISTORY History reviewed. No pertinent family history.  SOCIAL HISTORY Social History   Tobacco Use  . Smoking status: Never Smoker  . Smokeless tobacco: Never  Used  Substance Use Topics  . Alcohol use: Not on file  . Drug use: Not on file         OPHTHALMIC EXAM: Base Eye Exam    Visual Acuity (Snellen - Linear)      Right Left   Dist cc 20/20 -1 20/20 -1   Correction: Glasses       Tonometry (Tonopen, 2:52 PM)      Right Left   Pressure 19 21       Pupils      Pupils Dark Light Shape React APD   Right PERRL 3 2 Round Brisk None   Left PERRL 3 2 Round Brisk None       Visual Fields (Counting fingers)      Left Right    Full Full       Neuro/Psych    Oriented x3: Yes   Mood/Affect: Normal       Dilation    Left eye: 1.0% Mydriacyl, 2.5% Phenylephrine @ 2:52 PM        Slit Lamp and Fundus Exam    External Exam      Right Left   External Normal Normal       Slit Lamp Exam      Right Left   Lids/Lashes Normal Normal   Conjunctiva/Sclera White and quiet White and quiet  Cornea Clear Clear   Anterior Chamber Deep and quiet Deep and quiet   Iris Round and reactive Round and reactive   Lens Posterior chamber intraocular lens Posterior chamber intraocular lens   Anterior Vitreous Normal Normal       Fundus Exam      Right Left   Posterior Vitreous  Normal   Disc  Normal   C/D Ratio  0.4   Macula  Atrophy, Retinal pigment epithelial atrophy, Age related macular degeneration, Early age related macular degeneration, Drusen, Soft drusen, no hemorrhage   Vessels  Normal   Periphery  Normal          IMAGING AND PROCEDURES  Imaging and Procedures for 06/09/20  OCT, Retina - OU - Both Eyes       Right Eye Quality was good. Scan locations included subfoveal. Central Foveal Thickness: 237. Progression has been stable. Findings include vitreomacular adhesion , abnormal foveal contour.   Left Eye Quality was good. Scan locations included subfoveal. Central Foveal Thickness: 230. Progression has been stable. Findings include vitreomacular adhesion , abnormal foveal contour.        Intravitreal Injection,  Pharmacologic Agent - OS - Left Eye       Time Out 06/09/2020. 3:33 PM. Confirmed correct patient, procedure, site, and patient consented.   Anesthesia Topical anesthesia was used. Anesthetic medications included Akten 3.5%.   Procedure Preparation included Tobramycin 0.3%, Ofloxacin , 10% betadine to eyelids, 5% betadine to ocular surface. A supplied needle was used.   Injection:  1.25 mg Bevacizumab (AVASTIN) SOLN   NDC: 70360-001-02, Lot: [id number]   Route: Intravitreal, Site: Left Eye, Waste: 0 mg  Post-op Post injection exam found visual acuity of at least counting fingers. The patient tolerated the procedure well. There were no complications. The patient received written and verbal post procedure care education. Post injection medications were not given.   Notes Improved macular anatomy left eye currently at 8-week follow-up for CNVM. We will repeat today and maintain 8-week follow-up left eye                ASSESSMENT/PLAN:  Exudative age-related macular degeneration of right eye with active choroidal neovascularization (HCC) Stable OD at 1 month follow-up on OCT evaluation  Exudative age-related macular degeneration of left eye with active choroidal neovascularization (HCC) OS, at 8-week follow-up today, stable, no active CNVM. Multiple recurrences of recurrent CNVM when exam date extended beyond 8 weeks. We will repeat injection today and exam in 8 weeks OS      ICD-10-CM   1. Exudative age-related macular degeneration of left eye with active choroidal neovascularization (HCC)  H35.3221 OCT, Retina - OU - Both Eyes    Intravitreal Injection, Pharmacologic Agent - OS - Left Eye    Bevacizumab (AVASTIN) SOLN 1.25 mg  2. Exudative age-related macular degeneration of right eye with active choroidal neovascularization (HCC)  H35.3211     1. OS, much improved at 8-week follow-up, will repeat injection today intravitreal injection Avastin,   2.  3.  Ophthalmic  Meds Ordered this visit:  Meds ordered this encounter  Medications  . Bevacizumab (AVASTIN) SOLN 1.25 mg       Return in about 8 weeks (around 08/04/2020) for dilate, OS, AVASTIN OCT.  There are no Patient Instructions on file for this visit.   Explained the diagnoses, plan, and follow up with the patient and they expressed understanding.  Patient expressed understanding of the importance of proper follow up care.   Dominica Severin  Dorina Hoyer M.D. Diseases & Surgery of the Retina and Vitreous Retina & Diabetic [place] 06/09/20     Abbreviations: M myopia (nearsighted); A astigmatism; H hyperopia (farsighted); P presbyopia; Mrx spectacle prescription;  CTL contact lenses; OD right eye; OS left eye; OU both eyes  XT exotropia; ET esotropia; PEK punctate epithelial keratitis; PEE punctate epithelial erosions; DES dry eye syndrome; MGD meibomian gland dysfunction; ATs artificial tears; PFAT's preservative free artificial tears; [place] nuclear sclerotic cataract; PSC posterior subcapsular cataract; ERM epi-retinal membrane; PVD posterior vitreous detachment; RD retinal detachment; DM diabetes mellitus; DR diabetic retinopathy; NPDR non-proliferative diabetic retinopathy; PDR proliferative diabetic retinopathy; CSME clinically significant macular edema; DME diabetic macular edema; dbh dot blot hemorrhages; CWS cotton wool spot; POAG primary open angle glaucoma; C/D cup-to-disc ratio; HVF humphrey visual field; GVF goldmann visual field; OCT optical coherence tomography; IOP intraocular pressure; BRVO Branch retinal vein occlusion; CRVO central retinal vein occlusion; CRAO central retinal artery occlusion; BRAO branch retinal artery occlusion; RT retinal tear; SB scleral buckle; PPV pars plana vitrectomy; VH Vitreous hemorrhage; PRP panretinal laser photocoagulation; IVK intravitreal kenalog; VMT vitreomacular traction; MH Macular hole;  NVD neovascularization of the disc; NVE neovascularization elsewhere; AREDS  age related eye disease study; ARMD age related macular degeneration; POAG primary open angle glaucoma; EBMD epithelial/anterior basement membrane dystrophy; ACIOL anterior chamber intraocular lens; IOL intraocular lens; PCIOL posterior chamber intraocular lens; Phaco/IOL phacoemulsification with intraocular lens placement; [place] photorefractive keratectomy; LASIK laser assisted in situ keratomileusis; HTN hypertension; DM diabetes mellitus; COPD chronic obstructive pulmonary disease

## 2020-06-09 NOTE — Assessment & Plan Note (Signed)
OS, at 8-week follow-up today, stable, no active CNVM. Multiple recurrences of recurrent CNVM when exam date extended beyond 8 weeks. We will repeat injection today and exam in 8 weeks OS

## 2020-06-17 DIAGNOSIS — Z23 Encounter for immunization: Secondary | ICD-10-CM | POA: Diagnosis not present

## 2020-06-23 ENCOUNTER — Ambulatory Visit (INDEPENDENT_AMBULATORY_CARE_PROVIDER_SITE_OTHER): Payer: Medicare Other | Admitting: Ophthalmology

## 2020-06-23 ENCOUNTER — Other Ambulatory Visit: Payer: Self-pay

## 2020-06-23 ENCOUNTER — Encounter (INDEPENDENT_AMBULATORY_CARE_PROVIDER_SITE_OTHER): Payer: Self-pay | Admitting: Ophthalmology

## 2020-06-23 DIAGNOSIS — H353211 Exudative age-related macular degeneration, right eye, with active choroidal neovascularization: Secondary | ICD-10-CM | POA: Diagnosis not present

## 2020-06-23 DIAGNOSIS — H353221 Exudative age-related macular degeneration, left eye, with active choroidal neovascularization: Secondary | ICD-10-CM | POA: Diagnosis not present

## 2020-06-23 MED ORDER — BEVACIZUMAB CHEMO INJECTION 1.25MG/0.05ML SYRINGE FOR KALEIDOSCOPE
1.2500 mg | INTRAVITREAL | Status: AC | PRN
  Administered 2020-06-23: 1.25 mg via INTRAVITREAL

## 2020-06-23 NOTE — Progress Notes (Signed)
06/23/2020     CHIEF COMPLAINT Patient presents for Retina Follow Up   HISTORY OF PRESENT ILLNESS: Barbara Mccoy is a 86 y.o. female who presents to the clinic today for:   HPI    Retina Follow Up    Patient presents with  Wet AMD.  In right eye.  This started 7 weeks ago.  Severity is mild.  Duration of 7 weeks.  Since onset it is stable.          Comments    7 WK F/U OD, POSS AVASTIN OD    Vision stable, no new F/F           Last edited by Nichola Sizer D on 06/23/2020  8:09 AM. (History)      Referring physician: Burnard Bunting, MD [place],  [place] [address]  HISTORICAL INFORMATION:   Selected notes from the [facility]: No current outpatient medications on file. (Ophthalmic Drugs)   No current facility-administered medications for this visit. (Ophthalmic Drugs)   Current Outpatient Medications (Other)  Medication Sig  . aspirin 81 MG tablet Take 81 mg by mouth daily.  . calcium carbonate (OS-CAL) 600 MG TABS tablet Take 600 mg by mouth 2 (two) times daily with a meal.  . meloxicam (MOBIC) 15 MG tablet Take 1 tablet (15 mg total) by mouth daily. After meals (Patient not taking: Reported on 11/08/2019)  . Multiple Vitamins-Minerals (ICAPS AREDS 2 PO) Take by mouth.  . simvastatin (ZOCOR) 20 MG tablet Take 20 mg by mouth daily.   No current facility-administered medications for this visit. (Other)      REVIEW OF SYSTEMS:    ALLERGIES Allergies  Allergen Reactions  . Codeine Rash    PAST MEDICAL HISTORY History reviewed. No pertinent past medical history. History reviewed. No pertinent surgical history.  FAMILY HISTORY History reviewed. No pertinent family history.  SOCIAL HISTORY Social History   Tobacco Use  . Smoking status: Never Smoker  . Smokeless tobacco: Never Used  Substance Use Topics  . Alcohol use: Not on file  . Drug use: Not on file         OPHTHALMIC  EXAM:  Base Eye Exam    Visual Acuity (ETDRS)      Right Left   Dist cc 20/20 -1 20/20 -2   Correction: Glasses       Tonometry (Tonopen, 8:13 AM)      Right Left   Pressure 18 19       Pupils      Pupils Dark Light Shape React APD   Right PERRL 3 2 Round Brisk None   Left PERRL 3 2 Round Brisk None       Visual Fields (Counting fingers)      Left Right    Full Full       Extraocular Movement      Right Left    Full Full       Neuro/Psych    Oriented x3: Yes   Mood/Affect: Normal       Dilation    Right eye: 1.0% Mydriacyl, 2.5% Phenylephrine @ 8:13 AM        Slit Lamp and Fundus Exam    External Exam      Right Left   External Normal Normal       Slit Lamp Exam      Right Left   Lids/Lashes Normal Normal  Conjunctiva/Sclera White and quiet White and quiet   Cornea Clear Clear   Anterior Chamber Deep and quiet Deep and quiet   Iris Round and reactive Round and reactive   Lens Posterior chamber intraocular lens, Open posterior capsule Posterior chamber intraocular lens   Anterior Vitreous Normal Normal       Fundus Exam      Right Left   Posterior Vitreous Posterior vitreous detachment    Disc Normal    C/D Ratio 0.4    Macula Retinal pigment epithelial mottling, no exudates, Hard drusen, , Retinal pigment epithelial atrophy, Early age related macular degeneration, Soft drusen, no macular thickening    Vessels Normal    Periphery Normal           IMAGING AND PROCEDURES  Imaging and Procedures for 06/23/20  OCT, Retina - OU - Both Eyes       Right Eye Quality was good. Scan locations included subfoveal. Central Foveal Thickness: 242. Progression has been stable. Findings include abnormal foveal contour, retinal drusen .   Left Eye Quality was good. Scan locations included subfoveal. Central Foveal Thickness: 231. Progression has been stable. Findings include abnormal foveal contour, retinal drusen , vitreomacular adhesion .   Notes No  signs of active disease OU       Intravitreal Injection, Pharmacologic Agent - OD - Right Eye       Time Out 06/23/2020. 9:20 AM. Confirmed correct patient, procedure, site, and patient consented.   Anesthesia Topical anesthesia was used. Anesthetic medications included Akten 3.5%.   Procedure Preparation included Tobramycin 0.3%, Ofloxacin , 10% betadine to eyelids, 5% betadine to ocular surface. A 30 gauge needle was used.   Injection:  1.25 mg Bevacizumab (AVASTIN) SOLN   NDC: [address]-3   Route: Intravitreal, Site: Right Eye, Waste: 0 mg  Post-op Post injection exam found visual acuity of at least counting fingers. The patient tolerated the procedure well. There were no complications. The patient received written and verbal post procedure care education. Post injection medications were not given.                 ASSESSMENT/PLAN:  Exudative age-related macular degeneration of right eye with active choroidal neovascularization (HCC) Much less disease activity, currently at 7-week reaction, will repeat injection today and examination in 8 weeks  Exudative age-related macular degeneration of left eye with active choroidal neovascularization ([place]) Follow-up as scheduled      ICD-10-CM   1. 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 Exudative age-related macular degeneration of left eye with active choroidal neovascularization ([place])  H35.3221     1.  Repeat intravitreal Avastin OD today, extend interval examination OD to 8 weeks  2.  3.  Ophthalmic Meds Ordered this visit:  Meds ordered this encounter  Medications  . Bevacizumab (AVASTIN) SOLN 1.25 mg       Return in about 8 weeks (around 08/18/2020) for dilate, OD, AVASTIN OCT.  There are no Patient  Instructions on file for this visit.   Explained the diagnoses, plan, and follow up with the patient and they expressed understanding.  Patient expressed understanding of the importance of proper follow up care.   Clent Demark Candita Borenstein M.D. Diseases & Surgery of the Retina and Vitreous Retina & Diabetic [place] 06/23/20  Abbreviations: M myopia (nearsighted); A astigmatism; H hyperopia (farsighted); P presbyopia; Mrx spectacle prescription;  CTL contact lenses; OD right eye; OS left eye; OU both eyes  XT exotropia; ET esotropia; PEK punctate epithelial keratitis; PEE punctate epithelial erosions; DES dry eye syndrome; MGD meibomian gland dysfunction; ATs artificial tears; PFAT's preservative free artificial tears; [place] nuclear sclerotic cataract; PSC posterior subcapsular cataract; ERM epi-retinal membrane; PVD posterior vitreous detachment; RD retinal detachment; DM diabetes mellitus; DR diabetic retinopathy; NPDR non-proliferative diabetic retinopathy; PDR proliferative diabetic retinopathy; CSME clinically significant macular edema; DME diabetic macular edema; dbh dot blot hemorrhages; CWS cotton wool spot; POAG primary open angle glaucoma; C/D cup-to-disc ratio; HVF humphrey visual field; GVF goldmann visual field; OCT optical coherence tomography; IOP intraocular pressure; BRVO Branch retinal vein occlusion; CRVO central retinal vein occlusion; CRAO central retinal artery occlusion; BRAO branch retinal artery occlusion; RT retinal tear; SB scleral buckle; PPV pars plana vitrectomy; VH Vitreous hemorrhage; PRP panretinal laser photocoagulation; IVK intravitreal kenalog; VMT vitreomacular traction; MH Macular hole;  NVD neovascularization of the disc; NVE neovascularization elsewhere; AREDS age related eye disease study; ARMD age related macular degeneration; POAG primary open angle glaucoma; EBMD epithelial/anterior basement membrane dystrophy; ACIOL anterior chamber intraocular lens; IOL intraocular  lens; PCIOL posterior chamber intraocular lens; Phaco/IOL phacoemulsification with intraocular lens placement; [place] photorefractive keratectomy; LASIK laser assisted in situ keratomileusis; HTN hypertension; DM diabetes mellitus; COPD chronic obstructive pulmonary disease

## 2020-06-23 NOTE — Assessment & Plan Note (Signed)
Much less disease activity, currently at 7-week reaction, will repeat injection today and examination in 8 weeks

## 2020-06-23 NOTE — Assessment & Plan Note (Signed)
Follow up as scheduled.  

## 2020-07-04 DIAGNOSIS — Z1212 Encounter for screening for malignant neoplasm of rectum: Secondary | ICD-10-CM | POA: Diagnosis not present

## 2020-08-04 ENCOUNTER — Encounter (INDEPENDENT_AMBULATORY_CARE_PROVIDER_SITE_OTHER): Payer: Medicare Other | Admitting: Ophthalmology

## 2020-08-05 DIAGNOSIS — Z85828 Personal history of other malignant neoplasm of skin: Secondary | ICD-10-CM | POA: Diagnosis not present

## 2020-08-05 DIAGNOSIS — C44529 Squamous cell carcinoma of skin of other part of trunk: Secondary | ICD-10-CM

## 2020-08-05 DIAGNOSIS — L814 Other melanin hyperpigmentation: Secondary | ICD-10-CM | POA: Diagnosis not present

## 2020-08-05 DIAGNOSIS — L820 Inflamed seborrheic keratosis: Secondary | ICD-10-CM | POA: Diagnosis not present

## 2020-08-05 DIAGNOSIS — L821 Other seborrheic keratosis: Secondary | ICD-10-CM | POA: Diagnosis not present

## 2020-08-05 DIAGNOSIS — D485 Neoplasm of uncertain behavior of skin: Secondary | ICD-10-CM | POA: Diagnosis not present

## 2020-08-05 HISTORY — DX: Squamous cell carcinoma of skin of other part of trunk: C44.529

## 2020-08-06 ENCOUNTER — Encounter (INDEPENDENT_AMBULATORY_CARE_PROVIDER_SITE_OTHER): Payer: Self-pay | Admitting: Ophthalmology

## 2020-08-06 ENCOUNTER — Other Ambulatory Visit: Payer: Self-pay

## 2020-08-06 ENCOUNTER — Ambulatory Visit (INDEPENDENT_AMBULATORY_CARE_PROVIDER_SITE_OTHER): Payer: Medicare Other | Admitting: Ophthalmology

## 2020-08-06 DIAGNOSIS — H353221 Exudative age-related macular degeneration, left eye, with active choroidal neovascularization: Secondary | ICD-10-CM | POA: Diagnosis not present

## 2020-08-06 DIAGNOSIS — H353123 Nonexudative age-related macular degeneration, left eye, advanced atrophic without subfoveal involvement: Secondary | ICD-10-CM | POA: Diagnosis not present

## 2020-08-06 MED ORDER — BEVACIZUMAB 2.5 MG/0.1ML IZ SOSY
2.5000 mg | PREFILLED_SYRINGE | INTRAVITREAL | Status: AC | PRN
  Administered 2020-08-06: 2.5 mg via INTRAVITREAL

## 2020-08-06 NOTE — Assessment & Plan Note (Signed)
Observe look for progression of atrophy

## 2020-08-06 NOTE — Assessment & Plan Note (Signed)
The nature of wet macular degeneration was discussed with the patient.  Forms of therapy reviewed include the use of Anti-VEGF medications injected painlessly into the eye, as well as other possible treatment modalities, including thermal laser therapy. Fellow eye involvement and risks were discussed with the patient. Upon the finding of wet age related macular degeneration, treatment will be offered. The treatment regimen is on a treat as needed basis with the intent to treat if necessary and extend interval of exams when possible. On average 1 out of 6 patients do not need lifetime therapy. However, the risk of recurrent disease is high for a lifetime.  Initially monthly, then periodic, examinations and evaluations will determine whether the next treatment is required on the day of the examination.  OS, stable yet that 8 weeks.  We will repeat injection today to maintain with history of multiple recurrences.  Good acuity preserved

## 2020-08-06 NOTE — Progress Notes (Signed)
08/06/2020     CHIEF COMPLAINT Patient presents for Retina Follow Up (8 WK FU OS, POSS AVASTIN OS////Pt reports stable vision OS, no new F/F OS, no pain or pressure OS. )   HISTORY OF PRESENT ILLNESS: Barbara Mccoy is a 87 y.o. female who presents to the clinic today for:   HPI    Retina Follow Up    Patient presents with  Wet AMD.  In left eye.  This started 8 weeks ago.  Duration of 8 weeks.  Since onset it is stable. Additional comments: 8 WK FU OS, POSS AVASTIN OS    Pt reports stable vision OS, no new F/F OS, no pain or pressure OS.        Last edited by Varney Biles D on 08/06/2020  9:12 AM. (History)      Referring physician: Geoffry Paradise, MD [address] [place],  [place] [address]  HISTORICAL INFORMATION:   Selected notes from the MEDICAL RECORD NUMBER       CURRENT MEDICATIONS: No current outpatient medications on file. (Ophthalmic Drugs)   No current facility-administered medications for this visit. (Ophthalmic Drugs)   Current Outpatient Medications (Other)  Medication Sig  . aspirin 81 MG tablet Take 81 mg by mouth daily.  . calcium carbonate (OS-CAL) 600 MG TABS tablet Take 600 mg by mouth 2 (two) times daily with a meal.  . meloxicam (MOBIC) 15 MG tablet Take 1 tablet (15 mg total) by mouth daily. After meals (Patient not taking: Reported on 11/08/2019)  . Multiple Vitamins-Minerals (ICAPS AREDS 2 PO) Take by mouth.  . simvastatin (ZOCOR) 20 MG tablet Take 20 mg by mouth daily.   No current facility-administered medications for this visit. (Other)      REVIEW OF SYSTEMS:    ALLERGIES Allergies  Allergen Reactions  . Codeine Rash    PAST MEDICAL HISTORY History reviewed. No pertinent past medical history. History reviewed. No pertinent surgical history.  FAMILY HISTORY History reviewed. No pertinent family history.  SOCIAL HISTORY Social History   Tobacco Use  . Smoking status: Never Smoker  . Smokeless tobacco: Never Used          OPHTHALMIC EXAM:  Base Eye Exam    Visual Acuity (ETDRS)      Right Left   Dist cc 20/25 +1 20/20 -2   Correction: Glasses       Tonometry (Tonopen, 9:16 AM)      Right Left   Pressure 16 19       Pupils      Dark Light Shape React APD   Right 3 2 Round Brisk None   Left 3 2 Round Brisk None       Visual Fields (Counting fingers)      Left Right    Full Full       Extraocular Movement      Right Left    Full Full       Neuro/Psych    Oriented x3: Yes   Mood/Affect: Normal       Dilation    Left eye: 1.0% Mydriacyl, 2.5% Phenylephrine @ 9:16 AM        Slit Lamp and Fundus Exam    External Exam      Right Left   External Normal Normal       Slit Lamp Exam      Right Left   Lids/Lashes Normal Normal   Conjunctiva/Sclera White and quiet White and quiet  Cornea Clear Clear   Anterior Chamber Deep and quiet Deep and quiet   Iris Round and reactive Round and reactive   Lens Posterior chamber intraocular lens, Open posterior capsule Posterior chamber intraocular lens   Anterior Vitreous Normal Normal       Fundus Exam      Right Left   Posterior Vitreous  Normal   Disc  Normal   C/D Ratio  0.4   Macula  Atrophy, Retinal pigment epithelial atrophy, Age related macular degeneration, Early age related macular degeneration, Drusen, Soft drusen, no hemorrhage   Vessels  Normal   Periphery  Normal          IMAGING AND PROCEDURES  Imaging and Procedures for 08/06/20  OCT, Retina - OU - Both Eyes       Right Eye Quality was good. Scan locations included subfoveal. Central Foveal Thickness: 238. Progression has been stable. Findings include abnormal foveal contour, retinal drusen , vitreomacular adhesion .   Left Eye Quality was good. Scan locations included subfoveal. Central Foveal Thickness: 234. Progression has been stable. Findings include abnormal foveal contour, retinal drusen , vitreomacular adhesion .   Notes No signs of  active disease OU       Intravitreal Injection, Pharmacologic Agent - OS - Left Eye       Time Out 08/06/2020. 10:13 AM. Confirmed correct patient, procedure, site, and patient consented.   Anesthesia Topical anesthesia was used. Anesthetic medications included Akten 3.5%.   Procedure Preparation included Ofloxacin , 10% betadine to eyelids, 5% betadine to ocular surface. A 30 gauge needle was used.   Injection:  2.5 mg Bevacizumab (AVASTIN) 2.5mg /0.40mL SOSY   NDC: O3169984, [id number]   Route: Intravitreal, Site: Left Eye  Post-op Post injection exam found visual acuity of at least counting fingers. The patient tolerated the procedure well. There were no complications. The patient received written and verbal post procedure care education. Post injection medications were not given.                 ASSESSMENT/PLAN:  Exudative age-related macular degeneration of left eye with active choroidal neovascularization (HCC) The nature of wet macular degeneration was discussed with the patient.  Forms of therapy reviewed include the use of Anti-VEGF medications injected painlessly into the eye, as well as other possible treatment modalities, including thermal laser therapy. Fellow eye involvement and risks were discussed with the patient. Upon the finding of wet age related macular degeneration, treatment will be offered. The treatment regimen is on a treat as needed basis with the intent to treat if necessary and extend interval of exams when possible. On average 1 out of 6 patients do not need lifetime therapy. However, the risk of recurrent disease is high for a lifetime.  Initially monthly, then periodic, examinations and evaluations will determine whether the next treatment is required on the day of the examination.  OS, stable yet that 8 weeks.  We will repeat injection today to maintain with history of multiple recurrences.  Good acuity preserved  Advanced nonexudative  age-related macular degeneration of left eye without subfoveal involvement Observe look for progression of atrophy      ICD-10-CM   1. Exudative age-related macular degeneration of left eye with active choroidal neovascularization (HCC)  H35.3221 OCT, Retina - OU - Both Eyes    Intravitreal Injection, Pharmacologic Agent - OS - Left Eye    bevacizumab (AVASTIN) SOSY 2.5 mg  2. Advanced nonexudative age-related macular degeneration of left eye  without subfoveal involvement  H35.3123     1.  Repeat injection intravitreal Avastin OS today  2.  OD as scheduled  3.  Ophthalmic Meds Ordered this visit:  Meds ordered this encounter  Medications  . bevacizumab (AVASTIN) SOSY 2.5 mg       Return in about 8 weeks (around 10/01/2020) for dilate, OS, AVASTIN OCT.  There are no Patient Instructions on file for this visit.   Explained the diagnoses, plan, and follow up with the patient and they expressed understanding.  Patient expressed understanding of the importance of proper follow up care.   Clent Demark Giani Winther M.D. Diseases & Surgery of the Retina and Vitreous Retina & Diabetic [place] 08/06/20     Abbreviations: M myopia (nearsighted); A astigmatism; H hyperopia (farsighted); P presbyopia; Mrx spectacle prescription;  CTL contact lenses; OD right eye; OS left eye; OU both eyes  XT exotropia; ET esotropia; PEK punctate epithelial keratitis; PEE punctate epithelial erosions; DES dry eye syndrome; MGD meibomian gland dysfunction; ATs artificial tears; PFAT's preservative free artificial tears; [place] nuclear sclerotic cataract; PSC posterior subcapsular cataract; ERM epi-retinal membrane; PVD posterior vitreous detachment; RD retinal detachment; DM diabetes mellitus; DR diabetic retinopathy; NPDR non-proliferative diabetic retinopathy; PDR proliferative diabetic retinopathy; CSME clinically significant macular edema; DME diabetic macular edema; dbh dot blot hemorrhages; CWS cotton wool spot;  POAG primary open angle glaucoma; C/D cup-to-disc ratio; HVF humphrey visual field; GVF goldmann visual field; OCT optical coherence tomography; IOP intraocular pressure; BRVO Branch retinal vein occlusion; CRVO central retinal vein occlusion; CRAO central retinal artery occlusion; BRAO branch retinal artery occlusion; RT retinal tear; SB scleral buckle; PPV pars plana vitrectomy; VH Vitreous hemorrhage; PRP panretinal laser photocoagulation; IVK intravitreal kenalog; VMT vitreomacular traction; MH Macular hole;  NVD neovascularization of the disc; NVE neovascularization elsewhere; AREDS age related eye disease study; ARMD age related macular degeneration; POAG primary open angle glaucoma; EBMD epithelial/anterior basement membrane dystrophy; ACIOL anterior chamber intraocular lens; IOL intraocular lens; PCIOL posterior chamber intraocular lens; Phaco/IOL phacoemulsification with intraocular lens placement; [place] photorefractive keratectomy; LASIK laser assisted in situ keratomileusis; HTN hypertension; DM diabetes mellitus; COPD chronic obstructive pulmonary disease

## 2020-08-18 ENCOUNTER — Encounter (INDEPENDENT_AMBULATORY_CARE_PROVIDER_SITE_OTHER): Payer: Medicare Other | Admitting: Ophthalmology

## 2020-08-19 ENCOUNTER — Encounter (INDEPENDENT_AMBULATORY_CARE_PROVIDER_SITE_OTHER): Payer: Medicare Other | Admitting: Ophthalmology

## 2020-08-26 ENCOUNTER — Ambulatory Visit (INDEPENDENT_AMBULATORY_CARE_PROVIDER_SITE_OTHER): Payer: Medicare Other | Admitting: Ophthalmology

## 2020-08-26 ENCOUNTER — Other Ambulatory Visit: Payer: Self-pay

## 2020-08-26 DIAGNOSIS — H353211 Exudative age-related macular degeneration, right eye, with active choroidal neovascularization: Secondary | ICD-10-CM

## 2020-08-26 DIAGNOSIS — H353221 Exudative age-related macular degeneration, left eye, with active choroidal neovascularization: Secondary | ICD-10-CM | POA: Diagnosis not present

## 2020-08-26 MED ORDER — BEVACIZUMAB 2.5 MG/0.1ML IZ SOSY
2.5000 mg | PREFILLED_SYRINGE | INTRAVITREAL | Status: AC | PRN
  Administered 2020-08-26: 2.5 mg via INTRAVITREAL

## 2020-08-26 NOTE — Assessment & Plan Note (Signed)
Follow-up OD today at 9 weeks, schedule at 8, but delayed due to weather.  9 weeks with no signs of recurrence of CNVM post Avastin, will repeat injection today and examination again in 9 weeks plan treat and extend to 10 weeks upon evaluation next

## 2020-08-26 NOTE — Progress Notes (Signed)
08/26/2020     CHIEF COMPLAINT Patient presents for Retina Follow Up ( WK FU OD, POSS AVASTIN OD///Pt reports stable vision OU, pt denies any new F/F, pain, or pressure OU. )   HISTORY OF PRESENT ILLNESS: Barbara Mccoy is a 87 y.o. female who presents to the clinic today for:   HPI    Retina Follow Up    Patient presents with  Wet AMD.  In right eye.  This started 8 weeks ago.  Duration of 9 weeks.  Since onset it is stable. Additional comments:  WK FU OD, POSS AVASTIN OD   Pt reports stable vision OU, pt denies any new F/F, pain, or pressure OU.        Last edited by Hurman Horn, MD on 08/26/2020 10:16 AM. (History)      Referring physician: Burnard Bunting, MD [place],   [id number]  HISTORICAL INFORMATION:   Selected notes from the [facility]: No current outpatient medications on file. (Ophthalmic Drugs)   No current facility-administered medications for this visit. (Ophthalmic Drugs)   Current Outpatient Medications (Other)  Medication Sig  . aspirin 81 MG tablet Take 81 mg by mouth daily.  . calcium carbonate (OS-CAL) 600 MG TABS tablet Take 600 mg by mouth 2 (two) times daily with a meal.  . meloxicam (MOBIC) 15 MG tablet Take 1 tablet (15 mg total) by mouth daily. After meals (Patient not taking: Reported on 11/08/2019)  . Multiple Vitamins-Minerals (ICAPS AREDS 2 PO) Take by mouth.  . simvastatin (ZOCOR) 20 MG tablet Take 20 mg by mouth daily.   No current facility-administered medications for this visit. (Other)      REVIEW OF SYSTEMS:    ALLERGIES Allergies  Allergen Reactions  . Codeine Rash    PAST MEDICAL HISTORY No past medical history on file. No past surgical history on file.  FAMILY HISTORY No family history on file.  SOCIAL HISTORY Social History   Tobacco Use  . Smoking status: Never Smoker  . Smokeless tobacco: Never Used         OPHTHALMIC EXAM: Base Eye Exam     Visual Acuity (ETDRS)      Right Left   Dist cc 20/25 +1 20/20   Correction: Glasses       Tonometry (Tonopen, 9:24 AM)      Right Left   Pressure 18 19       Pupils      Pupils Dark Light Shape React APD   Right PERRL 3 2 Round Brisk None   Left PERRL 3 2 Round Brisk None       Visual Fields (Counting fingers)      Left Right    Full Full       Extraocular Movement      Right Left    Full Full       Neuro/Psych    Oriented x3: Yes   Mood/Affect: Normal       Dilation    Right eye: 1.0% Mydriacyl, 2.5% Phenylephrine @ 9:24 AM        Slit Lamp and Fundus Exam    External Exam      Right Left   External Normal Normal       Slit Lamp Exam      Right Left   Lids/Lashes Normal Normal   Conjunctiva/Sclera White and quiet White and quiet   Cornea Clear  Clear   Anterior Chamber Deep and quiet Deep and quiet   Iris Round and reactive Round and reactive   Lens Posterior chamber intraocular lens, Open posterior capsule Posterior chamber intraocular lens   Anterior Vitreous Normal Normal       Fundus Exam      Right Left   Posterior Vitreous Posterior vitreous detachment partial    Disc Normal    C/D Ratio 0.4    Macula Retinal pigment epithelial mottling, no exudates, Hard drusen, , Retinal pigment epithelial atrophy, Early age related macular degeneration, Soft drusen, no macular thickening    Vessels Normal    Periphery Normal           IMAGING AND PROCEDURES  Imaging and Procedures for 08/26/20  OCT, Retina - OU - Both Eyes       Right Eye Quality was good. Scan locations included subfoveal. Central Foveal Thickness: 244. Progression has improved. Findings include vitreomacular adhesion , abnormal foveal contour, retinal drusen , no SRF, no IRF.   Left Eye Quality was good. Scan locations included subfoveal. Central Foveal Thickness: 235. Progression has been stable. Findings include vitreomacular adhesion , no IRF, no SRF, retinal drusen .    Notes Overall improved macular findings each eye.  Juxta foveal CNVM, no signs of intraretinal fluid or CME in either eye.  OD currently at 9-week follow-up.  We will repeat injection today at 9 weeks and examination again right eye in 9 weeks  Follow-up left eye as scheduled       Intravitreal Injection, Pharmacologic Agent - OD - Right Eye       Time Out 08/26/2020. 10:19 AM. Confirmed correct patient, procedure, site, and patient consented.   Anesthesia Topical anesthesia was used. Anesthetic medications included Akten 3.5%.   Procedure Preparation included Tobramycin 0.3%, Ofloxacin , 10% betadine to eyelids, 5% betadine to ocular surface. A 30 gauge needle was used.   Injection:  2.5 mg Bevacizumab (AVASTIN) 2.5mg /0.34mL SOSY   NDC: 69629-528-41, Lot: [id number]   Route: Intravitreal, Site: Right Eye  Post-op Post injection exam found visual acuity of at least counting fingers. The patient tolerated the procedure well. There were no complications. The patient received written and verbal post procedure care education. Post injection medications were not given.                 ASSESSMENT/PLAN:  Exudative age-related macular degeneration of right eye with active choroidal neovascularization (HCC) Follow-up OD today at 9 weeks, schedule at 8, but delayed due to weather.  9 weeks with no signs of recurrence of CNVM post Avastin, will repeat injection today and examination again in 9 weeks plan treat and extend to 10 weeks upon evaluation next  Exudative age-related macular degeneration of left eye with active choroidal neovascularization ([place]) OS today only 2 weeks post Avastin, improved macular findings follow-up as scheduled      ICD-10-CM   1. Exudative age-related macular degeneration of right eye with active choroidal neovascularization (HCC)  H35.3211 OCT, Retina - OU - Both Eyes    Intravitreal Injection, Pharmacologic Agent - OD - Right Eye    bevacizumab  (AVASTIN) SOSY 2.5 mg  2. Exudative age-related macular degeneration of left eye with active choroidal neovascularization ([place])  H35.3221     1.  OD, follow-up examinations in the past 8 weeks plan to as today yet delayed to 9-week follow-up due to inclement weather.  At 9-week no signs of CNVM recurrence.  Thus will repeat injection  Avastin today and follow-up again in 9 weeks OD  2.  Dilate OS next as scheduled  3.  Ophthalmic Meds Ordered this visit:  Meds ordered this encounter  Medications  . bevacizumab (AVASTIN) SOSY 2.5 mg       Return in about 9 weeks (around 10/28/2020) for dilate, OD, AVASTIN OCT,   and dilate OS next as scheduled.  There are no Patient Instructions on file for this visit.   Explained the diagnoses, plan, and follow up with the patient and they expressed understanding.  Patient expressed understanding of the importance of proper follow up care.   Clent Demark Chela Sutphen M.D. Diseases & Surgery of the Retina and Vitreous Retina & Diabetic [place] 08/26/20     Abbreviations: M myopia (nearsighted); A astigmatism; H hyperopia (farsighted); P presbyopia; Mrx spectacle prescription;  CTL contact lenses; OD right eye; OS left eye; OU both eyes  XT exotropia; ET esotropia; PEK punctate epithelial keratitis; PEE punctate epithelial erosions; DES dry eye syndrome; MGD meibomian gland dysfunction; ATs artificial tears; PFAT's preservative free artificial tears; [place] nuclear sclerotic cataract; PSC posterior subcapsular cataract; ERM epi-retinal membrane; PVD posterior vitreous detachment; RD retinal detachment; DM diabetes mellitus; DR diabetic retinopathy; NPDR non-proliferative diabetic retinopathy; PDR proliferative diabetic retinopathy; CSME clinically significant macular edema; DME diabetic macular edema; dbh dot blot hemorrhages; CWS cotton wool spot; POAG primary open angle glaucoma; C/D cup-to-disc ratio; HVF humphrey visual field; GVF goldmann visual field; OCT  optical coherence tomography; IOP intraocular pressure; BRVO Branch retinal vein occlusion; CRVO central retinal vein occlusion; CRAO central retinal artery occlusion; BRAO branch retinal artery occlusion; RT retinal tear; SB scleral buckle; PPV pars plana vitrectomy; VH Vitreous hemorrhage; PRP panretinal laser photocoagulation; IVK intravitreal kenalog; VMT vitreomacular traction; MH Macular hole;  NVD neovascularization of the disc; NVE neovascularization elsewhere; AREDS age related eye disease study; ARMD age related macular degeneration; POAG primary open angle glaucoma; EBMD epithelial/anterior basement membrane dystrophy; ACIOL anterior chamber intraocular lens; IOL intraocular lens; PCIOL posterior chamber intraocular lens; Phaco/IOL phacoemulsification with intraocular lens placement; [place] photorefractive keratectomy; LASIK laser assisted in situ keratomileusis; HTN hypertension; DM diabetes mellitus; COPD chronic obstructive pulmonary disease

## 2020-08-26 NOTE — Patient Instructions (Signed)
Patient instructed to contact the office promptly for new onset visual acuity declines or distortions 

## 2020-08-26 NOTE — Assessment & Plan Note (Signed)
OS today only 2 weeks post Avastin, improved macular findings follow-up as scheduled

## 2020-10-01 ENCOUNTER — Encounter (INDEPENDENT_AMBULATORY_CARE_PROVIDER_SITE_OTHER): Payer: Medicare Other | Admitting: Ophthalmology

## 2020-10-02 ENCOUNTER — Encounter (INDEPENDENT_AMBULATORY_CARE_PROVIDER_SITE_OTHER): Payer: Medicare Other | Admitting: Ophthalmology

## 2020-10-06 ENCOUNTER — Encounter (INDEPENDENT_AMBULATORY_CARE_PROVIDER_SITE_OTHER): Payer: Self-pay | Admitting: Ophthalmology

## 2020-10-06 ENCOUNTER — Other Ambulatory Visit: Payer: Self-pay

## 2020-10-06 ENCOUNTER — Ambulatory Visit (INDEPENDENT_AMBULATORY_CARE_PROVIDER_SITE_OTHER): Payer: Medicare Other | Admitting: Ophthalmology

## 2020-10-06 DIAGNOSIS — H353221 Exudative age-related macular degeneration, left eye, with active choroidal neovascularization: Secondary | ICD-10-CM | POA: Diagnosis not present

## 2020-10-06 DIAGNOSIS — H353211 Exudative age-related macular degeneration, right eye, with active choroidal neovascularization: Secondary | ICD-10-CM | POA: Diagnosis not present

## 2020-10-06 MED ORDER — BEVACIZUMAB 2.5 MG/0.1ML IZ SOSY
2.5000 mg | PREFILLED_SYRINGE | INTRAVITREAL | Status: AC | PRN
  Administered 2020-10-06: 2.5 mg via INTRAVITREAL

## 2020-10-06 NOTE — Assessment & Plan Note (Signed)
OD today current no activity CNVM at 5-week follow-up interval for repeat dilated examination right eye as scheduled

## 2020-10-06 NOTE — Assessment & Plan Note (Signed)
OS today at 8-week follow-up post injection Avastin for CNVM.  We will repeat injection today and will extend her next exam interval in the left eye to 9 weeks

## 2020-10-06 NOTE — Progress Notes (Signed)
10/06/2020     CHIEF COMPLAINT Patient presents for Retina Follow Up (8 Wk F/U OS, poss Avastin OS//Pt sts she may see more floaters off and on OS, but she is not sure. Pt denies any other new symptoms or changes to [place] OU.)   HISTORY OF PRESENT ILLNESS: Barbara Mccoy is a 87 y.o. female who presents to the clinic today for:   HPI    Retina Follow Up    Patient presents with  Wet AMD.  In left eye.  This started 8 weeks ago.  Severity is mild.  Duration of 8 weeks.  Since onset it is stable. Additional comments: 8 Wk F/U OS, poss Avastin OS  Pt sts she may see more floaters off and on OS, but she is not sure. Pt denies any other new symptoms or changes to [place].       Last edited by Rockie Neighbours, [place] on 10/06/2020  1:11 PM. (History)      Referring physician: Burnard Bunting, MD [place],  [place] [address]  HISTORICAL INFORMATION:   Selected notes from the [facility]: No current outpatient medications on file. (Ophthalmic Drugs)   No current facility-administered medications for this visit. (Ophthalmic Drugs)   Current Outpatient Medications (Other)  Medication Sig  . aspirin 81 MG tablet Take 81 mg by mouth daily.  . calcium carbonate (OS-CAL) 600 MG TABS tablet Take 600 mg by mouth 2 (two) times daily with a meal.  . meloxicam (MOBIC) 15 MG tablet Take 1 tablet (15 mg total) by mouth daily. After meals (Patient not taking: Reported on 11/08/2019)  . Multiple Vitamins-Minerals (ICAPS AREDS 2 PO) Take by mouth.  . simvastatin (ZOCOR) 20 MG tablet Take 20 mg by mouth daily.   No current facility-administered medications for this visit. (Other)      REVIEW OF SYSTEMS:    ALLERGIES Allergies  Allergen Reactions  . Codeine Rash    PAST MEDICAL HISTORY History reviewed. No pertinent past medical history. History reviewed. No pertinent surgical history.  FAMILY HISTORY History reviewed. No pertinent family  history.  SOCIAL HISTORY Social History   Tobacco Use  . Smoking status: Never Smoker  . Smokeless tobacco: Never Used         OPHTHALMIC EXAM:  Base Eye Exam    Visual Acuity (ETDRS)      Right Left   Dist cc 20/25 +1 20/20 -1   Correction: Glasses       Tonometry (Tonopen, 1:11 PM)      Right Left   Pressure 17 18       Pupils      Pupils Dark Light Shape React APD   Right PERRL 3.5 2.5 Round Brisk None   Left PERRL 3.5 2.5 Round Brisk None       Visual Fields (Counting fingers)      Left Right    Full Full       Extraocular Movement      Right Left    Full Full       Neuro/Psych    Oriented x3: Yes   Mood/Affect: Normal       Dilation    Left eye: 1.0% Mydriacyl, 2.5% Phenylephrine @ 1:14 PM        Slit Lamp and Fundus Exam    External Exam      Right Left   External Normal Normal  Slit Lamp Exam      Right Left   Lids/Lashes Normal Normal   Conjunctiva/Sclera White and quiet White and quiet   Cornea Clear Clear   Anterior Chamber Deep and quiet Deep and quiet   Iris Round and reactive Round and reactive   Lens Posterior chamber intraocular lens, Open posterior capsule Posterior chamber intraocular lens   Anterior Vitreous Normal Normal       Fundus Exam      Right Left   Posterior Vitreous  Normal   Disc  Normal   C/D Ratio  0.4   Macula  Atrophy, Retinal pigment epithelial atrophy, Age related macular degeneration, Early age related macular degeneration, Drusen, Soft drusen, no hemorrhage   Vessels  Normal   Periphery  Normal          IMAGING AND PROCEDURES  Imaging and Procedures for 10/06/20  OCT, Retina - OU - Both Eyes       Right Eye Quality was good. Scan locations included subfoveal. Central Foveal Thickness: 242. Progression has improved. Findings include vitreomacular adhesion , abnormal foveal contour, retinal drusen , no SRF, no IRF.   Left Eye Quality was good. Scan locations included subfoveal.  Central Foveal Thickness: 235. Progression has been stable. Findings include vitreomacular adhesion , no IRF, no SRF, retinal drusen .   Notes Overall improved macular findings each eye.  Juxta foveal CNVM, no signs of intraretinal fluid or CME in either eye.  OD currently at 5-week follow-up and planned 9 week follow-up upcoming  Today OS at 8 weeks follow-up post Avastin, will repeat injection today and extend the interval in the left eye also to 9 weeks       Intravitreal Injection, Pharmacologic Agent - OS - Left Eye       Time Out 10/06/2020. 2:02 PM. Confirmed correct patient, procedure, site, and patient consented.   Anesthesia Topical anesthesia was used. Anesthetic medications included Akten 3.5%.   Procedure Preparation included Ofloxacin , 10% betadine to eyelids, 5% betadine to ocular surface. A 30 gauge needle was used.   Injection:  2.5 mg Bevacizumab (AVASTIN) 2.5mg /0.48mL SOSY   NDC: 27782-423-53, Lot: [id number]   Route: Intravitreal, Site: Left Eye  Post-op Post injection exam found visual acuity of at least counting fingers. The patient tolerated the procedure well. There were no complications. The patient received written and verbal post procedure care education. Post injection medications were not given.                 ASSESSMENT/PLAN:  Exudative age-related macular degeneration of left eye with active choroidal neovascularization ([place]) OS today at 8-week follow-up post injection Avastin for CNVM.  We will repeat injection today and will extend her next exam interval in the left eye to 9 weeks  Exudative age-related macular degeneration of right eye with active choroidal neovascularization ([place]) OD today current no activity CNVM at 5-week follow-up interval for repeat dilated examination right eye as scheduled      ICD-10-CM   1. Exudative age-related macular degeneration of left eye with active choroidal neovascularization (HCC)  H35.3221 OCT, Retina  - OU - Both Eyes    Intravitreal Injection, Pharmacologic Agent - OS - Left Eye    bevacizumab (AVASTIN) SOSY 2.5 mg  2. Exudative age-related macular degeneration of right eye with active choroidal neovascularization (HCC)  H35.3211     1.  OS, improved and still stable CNVM process currently at 8-week follow-up.  We will repeat injection OS today  to maintain a quiescent of previous active CNVM.  Will extend exam interval next to 9 weeks  2.  Follow-up OD as scheduled  3.  Ophthalmic Meds Ordered this visit:  Meds ordered this encounter  Medications  . bevacizumab (AVASTIN) SOSY 2.5 mg       Return in about 9 weeks (around 12/08/2020) for dilate, OS, AVASTIN OCT.  There are no Patient Instructions on file for this visit.   Explained the diagnoses, plan, and follow up with the patient and they expressed understanding.  Patient expressed understanding of the importance of proper follow up care.   Clent Demark Tremane Spurgeon M.D. Diseases & Surgery of the Retina and Vitreous Retina & Diabetic [place] 10/06/20     Abbreviations: M myopia (nearsighted); A astigmatism; H hyperopia (farsighted); P presbyopia; Mrx spectacle prescription;  CTL contact lenses; OD right eye; OS left eye; OU both eyes  XT exotropia; ET esotropia; PEK punctate epithelial keratitis; PEE punctate epithelial erosions; DES dry eye syndrome; MGD meibomian gland dysfunction; ATs artificial tears; PFAT's preservative free artificial tears; [place] nuclear sclerotic cataract; PSC posterior subcapsular cataract; ERM epi-retinal membrane; PVD posterior vitreous detachment; RD retinal detachment; DM diabetes mellitus; DR diabetic retinopathy; NPDR non-proliferative diabetic retinopathy; PDR proliferative diabetic retinopathy; CSME clinically significant macular edema; DME diabetic macular edema; dbh dot blot hemorrhages; CWS cotton wool spot; POAG primary open angle glaucoma; C/D cup-to-disc ratio; HVF humphrey visual field; GVF  goldmann visual field; OCT optical coherence tomography; IOP intraocular pressure; BRVO Branch retinal vein occlusion; CRVO central retinal vein occlusion; CRAO central retinal artery occlusion; BRAO branch retinal artery occlusion; RT retinal tear; SB scleral buckle; PPV pars plana vitrectomy; VH Vitreous hemorrhage; PRP panretinal laser photocoagulation; IVK intravitreal kenalog; VMT vitreomacular traction; MH Macular hole;  NVD neovascularization of the disc; NVE neovascularization elsewhere; AREDS age related eye disease study; ARMD age related macular degeneration; POAG primary open angle glaucoma; EBMD epithelial/anterior basement membrane dystrophy; ACIOL anterior chamber intraocular lens; IOL intraocular lens; PCIOL posterior chamber intraocular lens; Phaco/IOL phacoemulsification with intraocular lens placement; [place] photorefractive keratectomy; LASIK laser assisted in situ keratomileusis; HTN hypertension; DM diabetes mellitus; COPD chronic obstructive pulmonary disease

## 2020-10-27 ENCOUNTER — Encounter (INDEPENDENT_AMBULATORY_CARE_PROVIDER_SITE_OTHER): Payer: Self-pay | Admitting: Ophthalmology

## 2020-10-27 ENCOUNTER — Ambulatory Visit (INDEPENDENT_AMBULATORY_CARE_PROVIDER_SITE_OTHER): Payer: Medicare Other | Admitting: Ophthalmology

## 2020-10-27 ENCOUNTER — Other Ambulatory Visit: Payer: Self-pay

## 2020-10-27 DIAGNOSIS — H353211 Exudative age-related macular degeneration, right eye, with active choroidal neovascularization: Secondary | ICD-10-CM

## 2020-10-27 DIAGNOSIS — H353221 Exudative age-related macular degeneration, left eye, with active choroidal neovascularization: Secondary | ICD-10-CM | POA: Diagnosis not present

## 2020-10-27 MED ORDER — BEVACIZUMAB 2.5 MG/0.1ML IZ SOSY
2.5000 mg | PREFILLED_SYRINGE | INTRAVITREAL | Status: AC | PRN
  Administered 2020-10-27: 2.5 mg via INTRAVITREAL

## 2020-10-27 NOTE — Progress Notes (Signed)
10/27/2020     CHIEF COMPLAINT Patient presents for Retina Follow Up (9 Wk F/U OD, poss Avastin OD//Pt sts she is more aware of waviness OU, but it is stable. No new symptoms reported OU.)   HISTORY OF PRESENT ILLNESS: Barbara Mccoy is a 87 y.o. female who presents to the clinic today for:   HPI    Retina Follow Up    Patient presents with  Wet AMD.  In right eye.  This started 9 weeks ago.  Severity is mild.  Duration of 9 weeks.  Since onset it is stable. Additional comments: 9 Wk F/U OD, poss Avastin OD  Pt sts she is more aware of waviness OU, but it is stable. No new symptoms reported OU.       Last edited by Rockie Neighbours, [place] on 10/27/2020  1:18 PM. (History)      Referring physician: Burnard Bunting, MD [place],  [place] [address]  HISTORICAL INFORMATION:   Selected notes from the [facility]: No current outpatient medications on file. (Ophthalmic Drugs)   No current facility-administered medications for this visit. (Ophthalmic Drugs)   Current Outpatient Medications (Other)  Medication Sig  . aspirin 81 MG tablet Take 81 mg by mouth daily.  . calcium carbonate (OS-CAL) 600 MG TABS tablet Take 600 mg by mouth 2 (two) times daily with a meal.  . meloxicam (MOBIC) 15 MG tablet Take 1 tablet (15 mg total) by mouth daily. After meals (Patient not taking: Reported on 11/08/2019)  . Multiple Vitamins-Minerals (ICAPS AREDS 2 PO) Take by mouth.  . simvastatin (ZOCOR) 20 MG tablet Take 20 mg by mouth daily.   No current facility-administered medications for this visit. (Other)      REVIEW OF SYSTEMS:    ALLERGIES Allergies  Allergen Reactions  . Codeine Rash    PAST MEDICAL HISTORY History reviewed. No pertinent past medical history. History reviewed. No pertinent surgical history.  FAMILY HISTORY History reviewed. No pertinent family history.  SOCIAL HISTORY Social History   Tobacco Use  . Smoking  status: Never Smoker  . Smokeless tobacco: Never Used         OPHTHALMIC EXAM: Base Eye Exam    Visual Acuity (ETDRS)      Right Left   Dist cc 20/25 +1 20/20   Correction: Glasses       Tonometry (Tonopen, 1:18 PM)      Right Left   Pressure 15 18       Pupils      Pupils Dark Light Shape React APD   Right PERRL 3.5 2.5 Round Brisk None   Left PERRL 3.5 2.5 Round Brisk None       Visual Fields (Counting fingers)      Left Right    Full Full       Extraocular Movement      Right Left    Full Full       Neuro/Psych    Oriented x3: Yes   Mood/Affect: Normal       Dilation    Right eye: 1.0% Mydriacyl, 2.5% Phenylephrine @ 1:21 PM        Slit Lamp and Fundus Exam    External Exam      Right Left   External Normal Normal       Slit Lamp Exam      Right Left   Lids/Lashes Normal Normal  Conjunctiva/Sclera White and quiet White and quiet   Cornea Clear Clear   Anterior Chamber Deep and quiet Deep and quiet   Iris Round and reactive Round and reactive   Lens Posterior chamber intraocular lens, Open posterior capsule Posterior chamber intraocular lens   Anterior Vitreous Normal Normal       Fundus Exam      Right Left   Posterior Vitreous Posterior vitreous detachment partial    Disc Normal    C/D Ratio 0.4    Macula Retinal pigment epithelial mottling, no exudates, Hard drusen, , Retinal pigment epithelial atrophy, Early age related macular degeneration, Soft drusen, no macular thickening    Vessels Normal    Periphery Normal           IMAGING AND PROCEDURES  Imaging and Procedures for 10/27/20  OCT, Retina - OU - Both Eyes       Right Eye Quality was good. Scan locations included subfoveal. Central Foveal Thickness: 240. Progression has improved. Findings include vitreomacular adhesion , abnormal foveal contour, retinal drusen , no SRF, no IRF.   Left Eye Quality was good. Scan locations included subfoveal. Central Foveal Thickness:  232. Progression has been stable. Findings include vitreomacular adhesion , no IRF, no SRF, retinal drusen .   Notes Overall improved macular findings each eye.  Juxta foveal CNVM, no signs of intraretinal fluid or CME in either eye.  OD currently at 11-week follow-up and planned  11 week follow-up upcoming  Today OS at 8 weeks follow-up post Avastin, will repeat injection today and extend the interval in the left eye also to 9 weeks       Intravitreal Injection, Pharmacologic Agent - OD - Right Eye       Time Out 10/27/2020. 1:38 PM. Confirmed correct patient, procedure, site, and patient consented.   Anesthesia Topical anesthesia was used. Anesthetic medications included Akten 3.5%.   Procedure Preparation included Tobramycin 0.3%, Ofloxacin , 10% betadine to eyelids, 5% betadine to ocular surface. A 30 gauge needle was used.   Injection:  2.5 mg Bevacizumab (AVASTIN) 2.5mg /0.38mL SOSY   NDC: 54270-623-76, Lot: [id number]   Route: Intravitreal, Site: Right Eye  Post-op Post injection exam found visual acuity of at least counting fingers. The patient tolerated the procedure well. There were no complications. The patient received written and verbal post procedure care education. Post injection medications were not given.                 ASSESSMENT/PLAN:  Exudative age-related macular degeneration of right eye with active choroidal neovascularization (HCC) OD, much less CNVM activity as an outpatient at 11.2 weeks post injection Avastin for wet AMD.  No signs of reactivation of CNVM at this interval right eye.  We will continue to treat and extend the interval examination to a full 12 weeks now      ICD-10-CM   1. Exudative age-related macular degeneration of right eye with active choroidal neovascularization (HCC)  H35.3211 OCT, Retina - OU - Both Eyes    Intravitreal Injection, Pharmacologic Agent - OD - Right Eye    bevacizumab (AVASTIN) SOSY 2.5 mg    1.  OD, much  improved overall with much less activity at current 11 weeks post last injection.  We will extend next interval to 12 weeks post injection Avastin OD today  2.  Dilate OS next as scheduled  3.  Ophthalmic Meds Ordered this visit:  Meds ordered this encounter  Medications  . bevacizumab (AVASTIN) SOSY  2.5 mg       Return in about 3 months (around 01/27/2021) for dilate, OD, AVASTIN OCT.  There are no Patient Instructions on file for this visit.   Explained the diagnoses, plan, and follow up with the patient and they expressed understanding.  Patient expressed understanding of the importance of proper follow up care.   Clent Demark Latroya Ng M.D. Diseases & Surgery of the Retina and Vitreous Retina & Diabetic [place] 10/27/20     Abbreviations: M myopia (nearsighted); A astigmatism; H hyperopia (farsighted); P presbyopia; Mrx spectacle prescription;  CTL contact lenses; OD right eye; OS left eye; OU both eyes  XT exotropia; ET esotropia; PEK punctate epithelial keratitis; PEE punctate epithelial erosions; DES dry eye syndrome; MGD meibomian gland dysfunction; ATs artificial tears; PFAT's preservative free artificial tears; [place] nuclear sclerotic cataract; PSC posterior subcapsular cataract; ERM epi-retinal membrane; PVD posterior vitreous detachment; RD retinal detachment; DM diabetes mellitus; DR diabetic retinopathy; NPDR non-proliferative diabetic retinopathy; PDR proliferative diabetic retinopathy; CSME clinically significant macular edema; DME diabetic macular edema; dbh dot blot hemorrhages; CWS cotton wool spot; POAG primary open angle glaucoma; C/D cup-to-disc ratio; HVF humphrey visual field; GVF goldmann visual field; OCT optical coherence tomography; IOP intraocular pressure; BRVO Branch retinal vein occlusion; CRVO central retinal vein occlusion; CRAO central retinal artery occlusion; BRAO branch retinal artery occlusion; RT retinal tear; SB scleral buckle; PPV pars plana vitrectomy;  VH Vitreous hemorrhage; PRP panretinal laser photocoagulation; IVK intravitreal kenalog; VMT vitreomacular traction; MH Macular hole;  NVD neovascularization of the disc; NVE neovascularization elsewhere; AREDS age related eye disease study; ARMD age related macular degeneration; POAG primary open angle glaucoma; EBMD epithelial/anterior basement membrane dystrophy; ACIOL anterior chamber intraocular lens; IOL intraocular lens; PCIOL posterior chamber intraocular lens; Phaco/IOL phacoemulsification with intraocular lens placement; [place] photorefractive keratectomy; LASIK laser assisted in situ keratomileusis; HTN hypertension; DM diabetes mellitus; COPD chronic obstructive pulmonary disease

## 2020-10-27 NOTE — Assessment & Plan Note (Signed)
Dilate OS and follow-up as scheduled

## 2020-10-27 NOTE — Assessment & Plan Note (Signed)
OD, much less CNVM activity as an outpatient at 11.2 weeks post injection Avastin for wet AMD.  No signs of reactivation of CNVM at this interval right eye.  We will continue to treat and extend the interval examination to a full 12 weeks now

## 2020-11-10 DIAGNOSIS — Z1231 Encounter for screening mammogram for malignant neoplasm of breast: Secondary | ICD-10-CM | POA: Diagnosis not present

## 2020-11-20 DIAGNOSIS — R928 Other abnormal and inconclusive findings on diagnostic imaging of breast: Secondary | ICD-10-CM | POA: Diagnosis not present

## 2020-11-20 DIAGNOSIS — R922 Inconclusive mammogram: Secondary | ICD-10-CM | POA: Diagnosis not present

## 2020-11-20 DIAGNOSIS — R921 Mammographic calcification found on diagnostic imaging of breast: Secondary | ICD-10-CM | POA: Diagnosis not present

## 2020-12-08 ENCOUNTER — Encounter (INDEPENDENT_AMBULATORY_CARE_PROVIDER_SITE_OTHER): Payer: Self-pay | Admitting: Ophthalmology

## 2020-12-08 ENCOUNTER — Other Ambulatory Visit: Payer: Self-pay

## 2020-12-08 ENCOUNTER — Ambulatory Visit (INDEPENDENT_AMBULATORY_CARE_PROVIDER_SITE_OTHER): Payer: Medicare Other | Admitting: Ophthalmology

## 2020-12-08 DIAGNOSIS — H353123 Nonexudative age-related macular degeneration, left eye, advanced atrophic without subfoveal involvement: Secondary | ICD-10-CM | POA: Diagnosis not present

## 2020-12-08 DIAGNOSIS — H353221 Exudative age-related macular degeneration, left eye, with active choroidal neovascularization: Secondary | ICD-10-CM

## 2020-12-08 MED ORDER — BEVACIZUMAB 2.5 MG/0.1ML IZ SOSY
2.5000 mg | PREFILLED_SYRINGE | INTRAVITREAL | Status: AC | PRN
  Administered 2020-12-08: 2.5 mg via INTRAVITREAL

## 2020-12-08 NOTE — Progress Notes (Signed)
12/08/2020     CHIEF COMPLAINT Patient presents for Retina Follow Up (9wks fu OS/ Avastin OS/Pt states VA OU stable since last visit. Pt denies FOL, floaters, or ocular pain OU. /)   HISTORY OF PRESENT ILLNESS: Barbara Mccoy is a 87 y.o. female who presents to the clinic today for:   HPI    Retina Follow Up    Diagnosis: Wet AMD   Laterality: left eye   Onset: 9 weeks ago   Duration: 9 weeks   Course: stable   Comments: 9wks fu OS/ Avastin OS Pt states VA OU stable since last visit. Pt denies FOL, floaters, or ocular pain OU.         Last edited by Kendra Opitz, COA on 12/08/2020  1:09 PM. (History)      Referring physician: Burnard Bunting, MD [place],  [place] [address]  HISTORICAL INFORMATION:   Selected notes from the [facility]: No current outpatient medications on file. (Ophthalmic Drugs)   No current facility-administered medications for this visit. (Ophthalmic Drugs)   Current Outpatient Medications (Other)  Medication Sig  . aspirin 81 MG tablet Take 81 mg by mouth daily.  . calcium carbonate (OS-CAL) 600 MG TABS tablet Take 600 mg by mouth 2 (two) times daily with a meal.  . meloxicam (MOBIC) 15 MG tablet Take 1 tablet (15 mg total) by mouth daily. After meals (Patient not taking: Reported on 11/08/2019)  . Multiple Vitamins-Minerals (ICAPS AREDS 2 PO) Take by mouth.  . simvastatin (ZOCOR) 20 MG tablet Take 20 mg by mouth daily.   No current facility-administered medications for this visit. (Other)      REVIEW OF SYSTEMS:    ALLERGIES Allergies  Allergen Reactions  . Codeine Rash    PAST MEDICAL HISTORY History reviewed. No pertinent past medical history. History reviewed. No pertinent surgical history.  FAMILY HISTORY History reviewed. No pertinent family history.  SOCIAL HISTORY Social History   Tobacco Use  . Smoking status: Never Smoker  . Smokeless tobacco: Never Used          OPHTHALMIC EXAM: Base Eye Exam    Visual Acuity (ETDRS)      Right Left   Dist cc 20/20 -2 20/20 -1   Correction: Glasses       Tonometry (Tonopen, 1:12 PM)      Right Left   Pressure 13 14       Pupils      Pupils Dark Light Shape React APD   Right PERRL 3.5 2.5 Round Brisk None   Left PERRL 3.5 2.5 Round Brisk None       Visual Fields (Counting fingers)      Left Right    Full Full       Extraocular Movement      Right Left    Full Full       Neuro/Psych    Oriented x3: Yes   Mood/Affect: Normal       Dilation    Left eye: 1.0% Mydriacyl, 2.5% Phenylephrine @ 1:12 PM        Slit Lamp and Fundus Exam    External Exam      Right Left   External Normal Normal       Slit Lamp Exam      Right Left   Lids/Lashes Normal Normal   Conjunctiva/Sclera White and quiet White and quiet   Cornea Clear  Clear   Anterior Chamber Deep and quiet Deep and quiet   Iris Round and reactive Round and reactive   Lens Posterior chamber intraocular lens, Open posterior capsule Posterior chamber intraocular lens   Anterior Vitreous Normal Normal       Fundus Exam      Right Left   Posterior Vitreous  Normal   Disc  Normal   C/D Ratio  0.4   Macula  Atrophy, Retinal pigment epithelial atrophy, Age related macular degeneration, Early age related macular degeneration, Drusen, Soft drusen, no hemorrhage   Vessels  Normal   Periphery  Normal          IMAGING AND PROCEDURES  Imaging and Procedures for 12/08/20  OCT, Retina - OU - Both Eyes       Right Eye Quality was good. Scan locations included subfoveal. Central Foveal Thickness: 240. Progression has improved. Findings include vitreomacular adhesion , abnormal foveal contour, retinal drusen , no SRF, no IRF.   Left Eye Quality was good. Scan locations included subfoveal. Central Foveal Thickness: 233. Progression has been stable. Findings include vitreomacular adhesion , no IRF, no SRF, retinal drusen .    Notes Overall improved macular findings each eye.  Juxta foveal CNVM, no signs of intraretinal fluid or CME in either eye.  OD currently at 1 planned  11 week follow-up upcoming, 6 weeks post last injection  Today OS at 9 weeks follow-up post Avastin, will repeat injection today and extend the interval in the left eye also to 10 weeks       Intravitreal Injection, Pharmacologic Agent - OS - Left Eye       Time Out 12/08/2020. 1:36 PM. Confirmed correct patient, procedure, site, and patient consented.   Anesthesia Topical anesthesia was used. Anesthetic medications included [place] 3.5%.   Procedure Preparation included Ofloxacin , 10% betadine to eyelids, 5% betadine to ocular surface. A 30 gauge needle was used.   Injection:  2.5 mg Bevacizumab (AVASTIN) 2.5mg /0.59mL SOSY   NDC: 50093-818-29, Lot: [id number]   Route: Intravitreal, Site: Left Eye  Post-op Post injection exam found visual acuity of at least counting fingers. The patient tolerated the procedure well. There were no complications. The patient received written and verbal post procedure care education. Post injection medications were not given.                 ASSESSMENT/PLAN:  Exudative age-related macular degeneration of left eye with active choroidal neovascularization (HCC) The nature of wet macular degeneration was discussed with the patient.  Forms of therapy reviewed include the use of Anti-VE GF medications injected painlessly into the eye, as well as other possible treatment modalities, including thermal laser therapy. Fellow eye involvement and risks were discussed with the patient. Upon the finding of wet age related macular degeneration, treatment will be offered. The treatment regimen is on a treat as needed basis with the intent to treat if necessary and extend interval of exams when possible. On average 1 out of 6 patients do not need lifetime therapy. However, the risk of recurrent disease is high for  a lifetime.  Initially monthly, then periodic, examinations and evaluations will determine whether the next treatment is required on the day of the examination.  OS, currently at 9-week follow-up, no active disease controlled by intravitreal Avastin we will repeat today      ICD-10-CM   1. Exudative age-related macular degeneration of left eye with active choroidal neovascularization ([place])  H35.3221 OCT, Retina - OU -  Both Eyes    Intravitreal Injection, Pharmacologic Agent - OS - Left Eye    bevacizumab (AVASTIN) SOSY 2.5 mg  2. Advanced nonexudative age-related macular degeneration of left eye without subfoveal involvement  H35.3123     1.  Improve macular findings OS overall with stable acuity.  On 9-week follow-up for wet ARMD.  We will repeat injection today and examination next OS and 10 weeks  2.  OD follow-up as scheduled possible injection  3.  Ophthalmic Meds Ordered this visit:  Meds ordered this encounter  Medications  . bevacizumab (AVASTIN) SOSY 2.5 mg       Return in about 10 weeks (around 02/16/2021) for dilate, OS, AVASTIN OCT,,, and follow-up OD as scheduled.  There are no Patient Instructions on file for this visit.   Explained the diagnoses, plan, and follow up with the patient and they expressed understanding.  Patient expressed understanding of the importance of proper follow up care.   Clent Demark Romain Erion M.D. Diseases & Surgery of the Retina and Vitreous [facility] 12/08/20     Abbreviations: M myopia (nearsighted); A astigmatism; H hyperopia (farsighted); P presbyopia; Mrx spectacle prescription;  CTL contact lenses; OD right eye; OS left eye; OU both eyes  XT exotropia; ET esotropia; PEK punctate epithelial keratitis; PEE punctate epithelial erosions; DES dry eye syndrome; MGD meibomian gland dysfunction; ATs artificial tears; PFAT's preservative free artificial tears; [place] nuclear sclerotic cataract; PSC posterior subcapsular cataract;  ERM epi-retinal membrane; PVD posterior vitreous detachment; RD retinal detachment; DM diabetes mellitus; DR diabetic retinopathy; NPDR non-proliferative diabetic retinopathy; PDR proliferative diabetic retinopathy; CSME clinically significant macular edema; DME diabetic macular edema; dbh dot blot hemorrhages; CWS cotton wool spot; POAG primary open angle glaucoma; C/D cup-to-disc ratio; HVF humphrey visual field; GVF goldmann visual field; OCT optical coherence tomography; IOP intraocular pressure; BRVO Branch retinal vein occlusion; CRVO central retinal vein occlusion; CRAO central retinal artery occlusion; BRAO branch retinal artery occlusion; RT retinal tear; SB scleral buckle; PPV pars plana vitrectomy; VH Vitreous hemorrhage; PRP panretinal laser photocoagulation; IVK intravitreal kenalog; VMT vitreomacular traction; MH Macular hole;  NVD neovascularization of the disc; NVE neovascularization elsewhere; AREDS age related eye disease study; ARMD age related macular degeneration; POAG primary open angle glaucoma; EBMD epithelial/anterior basement membrane dystrophy; ACIOL anterior chamber intraocular lens; IOL intraocular lens; PCIOL posterior chamber intraocular lens; Phaco/IOL phacoemulsification with intraocular lens placement; [place] photorefractive keratectomy; LASIK laser assisted in situ keratomileusis; HTN hypertension; DM diabetes mellitus; COPD chronic obstructive pulmonary disease

## 2020-12-08 NOTE — Assessment & Plan Note (Signed)
The nature of wet macular degeneration was discussed with the patient.  Forms of therapy reviewed include the use of Anti-VE GF medications injected painlessly into the eye, as well as other possible treatment modalities, including thermal laser therapy. Fellow eye involvement and risks were discussed with the patient. Upon the finding of wet age related macular degeneration, treatment will be offered. The treatment regimen is on a treat as needed basis with the intent to treat if necessary and extend interval of exams when possible. On average 1 out of 6 patients do not need lifetime therapy. However, the risk of recurrent disease is high for a lifetime.  Initially monthly, then periodic, examinations and evaluations will determine whether the next treatment is required on the day of the examination.  OS, currently at 9-week follow-up, no active disease controlled by intravitreal Avastin we will repeat today

## 2020-12-08 NOTE — Assessment & Plan Note (Deleted)
The nature of wet macular degeneration was discussed with the patient.  Forms of therapy reviewed include the use of Anti-VE GF medications injected painlessly into the eye, as well as other possible treatment modalities, including thermal laser therapy. Fellow eye involvement and risks were discussed with the patient. Upon the finding of wet age related macular degeneration, treatment will be offered. The treatment regimen is on a treat as needed basis with the intent to treat if necessary and extend interval of exams when possible. On average 1 out of 6 patients do not need lifetime therapy. However, the risk of recurrent disease is high for a lifetime.  Initially monthly, then periodic, examinations and evaluations will determine whether the next treatment is required on the day of the examination.  OS, currently at 9-week follow-up, no active disease controlled by intravitreal Avastin we will repeat today 

## 2020-12-10 DIAGNOSIS — D0512 Intraductal carcinoma in situ of left breast: Secondary | ICD-10-CM | POA: Diagnosis not present

## 2020-12-12 ENCOUNTER — Telehealth: Payer: Self-pay | Admitting: Oncology

## 2020-12-12 NOTE — Telephone Encounter (Signed)
Spoke to patient to confirm morning [facility] appointment for 5/25, [facility] will send paperwork

## 2020-12-15 ENCOUNTER — Encounter: Payer: Self-pay | Admitting: Internal Medicine

## 2020-12-15 DIAGNOSIS — R7301 Impaired fasting glucose: Secondary | ICD-10-CM | POA: Diagnosis not present

## 2020-12-15 DIAGNOSIS — C50919 Malignant neoplasm of unspecified site of unspecified female breast: Secondary | ICD-10-CM | POA: Diagnosis not present

## 2020-12-15 DIAGNOSIS — F329 Major depressive disorder, single episode, unspecified: Secondary | ICD-10-CM | POA: Diagnosis not present

## 2020-12-15 DIAGNOSIS — K219 Gastro-esophageal reflux disease without esophagitis: Secondary | ICD-10-CM | POA: Diagnosis not present

## 2020-12-19 ENCOUNTER — Encounter: Payer: Self-pay | Admitting: *Deleted

## 2020-12-19 DIAGNOSIS — D0512 Intraductal carcinoma in situ of left breast: Secondary | ICD-10-CM | POA: Insufficient documentation

## 2020-12-23 NOTE — Progress Notes (Signed)
[place]  [phone] Fax:([phone]) [phone]     ID: GIULIANNA ROCHA DOB: [birth year]  MR#: [id number]  AXE#:[id number]  Patient Care Team: Burnard Bunting, MD as PCP - General (Internal Medicine) Mauro Kaufmann, RN as Oncology Nurse Navigator Rockwell Germany, RN as Oncology Nurse Navigator Erroll Luna, MD as Consulting Physician (General Surgery) Xyler Terpening, Virgie Dad, MD as Consulting Physician (Oncology) Eppie Gibson, MD as Attending Physician (Radiation Oncology) Rolm Bookbinder, MD as Consulting Physician (Dermatology) Zadie Rhine Clent Demark, MD as Consulting Physician (Ophthalmology) Chauncey Cruel, MD OTHER MD:  CHIEF COMPLAINT: estrogen receptor positive noninvasive breast cancer  CURRENT TREATMENT: awaiting definitve surgery   HISTORY OF CURRENT ILLNESS: LYNNELLE MESMER had routine screening mammography on 11/10/2020 showing a possible abnormality in the left breast. She underwent left diagnostic mammography with tomography at [facility] on 11/20/2020 showing: breast density category B; 7 mm cluster of amorphous calcifications in left breast at 5 o'clock.  Accordingly on 12/10/2020 she proceeded to biopsy of the left breast area in question. The pathology from this procedure ([id number]) showed: ductal carcinoma in situ with necrosis and calcifications, high grade. Prognostic indicators significant for: estrogen receptor, >95% positive and progesterone receptor, 20% positive, both with strong staining intensity.   Cancer Staging Ductal carcinoma in situ (DCIS) of left breast Staging form: Breast, AJCC 8th Edition - Clinical stage from 12/24/2020: Stage 0 (cTis (DCIS), cN0, cM0, ER+, PR+, HER2: Not Assessed) - Unsigned Stage prefix: Initial diagnosis Nuclear grade: G3 Laterality: Left Staged by: Pathologist and managing physician Stage used in treatment planning: Yes National guidelines used in treatment planning: Yes Type of national guideline used in treatment  planning: NCCN   The patient's subsequent history is as detailed below.   INTERVAL HISTORY: Samyah was evaluated in the multidisciplinary breast cancer clinic on 12/24/2020 accompanied by a friend. Her case was also presented at the multidisciplinary breast cancer conference on the same day. At that time a preliminary plan was proposed: Surgery with no sentinel lymph node sampling, adjuvant radiation, likely no antiestrogens   REVIEW OF SYSTEMS: There were no specific symptoms leading to the original mammogram, which was routinely scheduled. The patient denies unusual headaches, visual changes, nausea, vomiting, stiff neck, dizziness, or gait imbalance. There has been no cough, phlegm production, or pleurisy, no chest pain or pressure, and no change in bowel or bladder habits. The patient denies fever, rash, bleeding, unexplained fatigue or unexplained weight loss. A detailed review of systems was otherwise entirely negative.   COVID 19 VACCINATION STATUS: fully vaccinated Levan Hurst), with 2 boosters   PAST MEDICAL HISTORY: Past Medical History:  Diagnosis Date  . Cataract    bilateral  . Hypertension   . Irritable bowel 1950   type 3C  . 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 Migraine headache 1950   "fewer in old age"  . Squamous cell carcinoma of skin of chest 08/05/2020    PAST SURGICAL HISTORY: Past Surgical History:  Procedure Laterality Date  . CATARACT EXTRACTION, BILATERAL    . INGUINAL HERNIA REPAIR     in 40's  . SKIN CANCER EXCISION    . TONSILLECTOMY     in childhood  . TOTAL ABDOMINAL HYSTERECTOMY     w/ BSO, in 40's    FAMILY HISTORY: Family History  Problem Relation Age of Onset  . Brain cancer Father     Her father died at age 73 from astrocytoma, diagnosed the  year prior. Her mother died at age 46. Osmara is an only child. Aside from her father, there is no family history of cancer to her  knowledge.   GYNECOLOGIC HISTORY:  No LMP recorded. Menarche: 21-73 years old Age at first live birth: 87 years old GX P 2 LMP with hysterectomy HRT used for >10 years  Hysterectomy? Yes, in her 40's BSO? 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 She is widowed and lives in [place]. Her late husband was Dr. Olga Millers, a cardiothoracic surgeon who performed the first bypass surgery in [facility]! Son Fayrene Fearing "Rosanne Ashing" Montez Hageman, age 50, is retired from working for the [employer] and lives in [place], [place]. Son Lorin Picket, age 51, works for the [employer] and lives in [place], [place]. Adopted daughter Samara Deist, age 21, is unemployed and lives here in [place]. Lynita has two grandchildren, ages 52 and 92.     ADVANCED DIRECTIVES: in place, son Rosanne Ashing is her HCPOA   HEALTH MAINTENANCE: Social History   Tobacco Use  . Smoking status: Never Smoker  . Smokeless tobacco: Never Used  Substance Use Topics  . Alcohol use: Yes    Alcohol/week: 5.0 standard drinks    Types: 4 Glasses of wine, 1 Shots of liquor per week     Colonoscopy: last at age 39, recall not required (age)  PAP: none, s/p hysterectomy  Bone density: done at [facility]   Allergies  Allergen Reactions  . Codeine Rash    Current Outpatient Medications  Medication Sig Dispense Refill  . aspirin 81 MG tablet Take 81 mg by mouth daily.    . calcium carbonate (OS-CAL) 600 MG TABS tablet Take 600 mg by mouth 2 (two) times daily with a meal.    . Multiple Vitamins-Minerals (ICAPS AREDS 2 PO) Take by mouth.    . simvastatin (ZOCOR) 20 MG tablet Take 20 mg by mouth daily.     No current facility-administered medications for this visit.    OBJECTIVE: White woman who appears younger than stated age  Vitals:   12/24/20 0842  BP: (!) 147/74  Pulse: 83  Resp: 18  Temp: (!) 97.3 F (36.3 C)  SpO2: 96%     Body mass index is 27.61 kg/m.   Wt Readings  from Last 3 Encounters:  12/24/20 168 lb 8 oz (76.4 kg)      ECOG FS:1 - Symptomatic but completely ambulatory  Ocular: Sclerae unicteric, pupils round and equal Ear-nose-throat: Wearing a mask Lymphatic: No cervical or supraclavicular adenopathy Lungs no rales or rhonchi Heart regular rate and rhythm Abd soft, nontender, positive bowel sounds MSK no focal spinal tenderness, no joint edema Neuro: non-focal, well-oriented, appropriate affect Breasts: The right breast is unremarkable.  The left breast is status post recent biopsy.  There is a moderate ecchymosis inferiorly.  There is no palpable mass.  Both axillae are benign.   LAB RESULTS:  CMP     Component Value Date/Time   NA 141 12/24/2020 0816   K 4.2 12/24/2020 0816   CL 105 12/24/2020 0816   CO2 26 12/24/2020 0816   GLUCOSE 95 12/24/2020 0816   BUN 10 12/24/2020 0816   CREATININE 0.67 12/24/2020 0816   CALCIUM 10.2 12/24/2020 0816   PROT 6.6 12/24/2020 0816   ALBUMIN 3.9 12/24/2020 0816   AST 24 12/24/2020 0816   ALT 25 12/24/2020 0816   ALKPHOS 92 12/24/2020 0816  BILITOT 0.6 12/24/2020 0816   GFRNONAA >60 12/24/2020 0816    No results found for: TOTALPROTELP, ALBUMINELP, A1GS, A2GS, BETS, BETA2SER, GAMS, MSPIKE, SPEI  Lab Results  Component Value Date   WBC 6.3 12/24/2020   NEUTROABS 4.8 12/24/2020   HGB 14.5 12/24/2020   HCT 42.5 12/24/2020   MCV 93.0 12/24/2020   PLT 156 12/24/2020    No results found for: LABCA2  No components found for: [id number]  No results for input(s): INR in the last 168 hours.  No results found for: LABCA2  No results found for: KFM403  No results found for: FVO360  No results found for: OVP034  No results found for: CA2729  No components found for: HGQUANT  No results found for: CEA1 / No results found for: CEA1   No results found for: AFPTUMOR  No results found for: CHROMOGRNA  No results found for: KPAFRELGTCHN, LAMBDASER, KAPLAMBRATIO (kappa/lambda  light chains)  No results found for: HGBA, HGBA2QUANT, HGBFQUANT, HGBSQUAN (Hemoglobinopathy evaluation)   No results found for: LDH  No results found for: IRON, TIBC, IRONPCTSAT (Iron and TIBC)  No results found for: FERRITIN  Urinalysis No results found for: COLORURINE, APPEARANCEUR, LABSPEC, PHURINE, GLUCOSEU, HGBUR, BILIRUBINUR, KETONESUR, PROTEINUR, UROBILINOGEN, NITRITE, LEUKOCYTESUR   STUDIES: Intravitreal Injection, Pharmacologic Agent - OS - Left Eye  Result Date: 12/08/2020 Time Out 12/08/2020. 1:36 PM. Confirmed correct patient, procedure, site, and patient consented. Anesthesia Topical anesthesia was used. Anesthetic medications included Akten 3.5%. Procedure Preparation included Ofloxacin , 10% betadine to eyelids, 5% betadine to ocular surface. A 30 gauge needle was used. Injection: 2.5 mg Bevacizumab (AVASTIN) 2.5mg /0.53mL SOSY   NDC: 03524-818-59, Lot: [id number]   Route: Intravitreal, Site: Left Eye Post-op Post injection exam found visual acuity of at least counting fingers. The patient tolerated the procedure well. There were no complications. The patient received written and verbal post procedure care education. Post injection medications were not given.   OCT, Retina - OU - Both Eyes  Result Date: 12/08/2020 Right Eye Quality was good. Scan locations included subfoveal. Central Foveal Thickness: 240. Progression has improved. Findings include vitreomacular adhesion , abnormal foveal contour, retinal drusen , no SRF, no IRF. Left Eye Quality was good. Scan locations included subfoveal. Central Foveal Thickness: 233. Progression has been stable. Findings include vitreomacular adhesion , no IRF, no SRF, retinal drusen . Notes Overall improved macular findings each eye.  Juxta foveal CNVM, no signs of intraretinal fluid or CME in either eye. OD currently at 1 planned  11 week follow-up upcoming, 6 weeks post last injection Today OS at 9 weeks follow-up post Avastin, will repeat  injection today and extend the interval in the left eye also to 10 weeks    ELIGIBLE FOR AVAILABLE RESEARCH PROTOCOL:no  ASSESSMENT: 87 y.o. [place] woman status post left breast biopsy 12/10/2020 for ductal carcinoma in situ, grade 3, estrogen and progesterone receptor positive  (1) definitive surgery pending  (2) adjuvant radiation to follow  (3) we will forgo antiestrogen treatment  PLAN: I met today with Addyson to review her new diagnosis. Specifically we discussed the biology of her breast cancer, its diagnosis, staging, treatment  options and prognosis.Billiejean understands that in noninvasive ductal carcinoma, also called ductal carcinoma in situ ("DCIS") the breast cancer cells remain trapped in the ducts were they started. They cannot travel to a vital organ. For that reason these cancers in themselves are not life-threatening.  If the whole breast is removed then all the ducts are removed and  since the cancer cells are trapped in the ducts, the cure rate with mastectomy for noninvasive breast cancer is approximately 99%. Nevertheless we recommend lumpectomy, because there is no survival advantage to mastectomy and because the cosmetic result is generally superior with breast conservation.  Since the patient is keeping her breast, there will be some risk of recurrence. The recurrence can only be in the same breast since, again, the cells are trapped in the ducts. There is no connection from one breast to the other. The risk of local recurrence is cut by more than half with radiation, which is standard in this situation.  In estrogen receptor positive cancers like Aria's, anti-estrogens can also be considered. They will further reduce the risk of recurrence by one half. In addition anti-estrogens will lower the risk of a new breast cancer developing in either breast, also by one half. That risk otherwise approaches 1% per year.  However, given the patient's age, despite her  excellent health, and the fact that antiestrogens can have significant side effects (osteoporosis from the aromatase inhibitors, blood clots from tamoxifen) we decided the potential harm outweighed the benefits and she will be followed with observation alone, no antiestrogens.  Elnore has a good understanding of the overall plan. She agrees with it. She knows the goal of treatment in her case is cure. She will call with any problems that may develop before her next visit here.  Total encounter time 55 minutes.   Virgie Dad. Shemicka Cohrs, MD 12/24/2020 10:54 AM Medical Oncology and Hematology [facility] [place], [place] [address] Tel. [phone]    Fax. [phone]   This document serves as a record of services personally performed by Lurline Del, MD. It was created on his behalf by Wilburn Mylar, a trained medical scribe. The creation of this record is based on the scribe's personal observations and the provider's statements to them.   I, Lurline Del MD, have reviewed the above documentation for accuracy and completeness, and I agree with the above.    *Total Encounter Time as defined by the Centers for Medicare and Medicaid Services includes, in addition to the face-to-face time of a patient visit (documented in the note above) non-face-to-face time: obtaining and reviewing outside history, ordering and reviewing medications, tests or procedures, care coordination (communications with other health care professionals or caregivers) and documentation in the medical record.

## 2020-12-24 ENCOUNTER — Ambulatory Visit: Payer: Self-pay | Admitting: Surgery

## 2020-12-24 ENCOUNTER — Encounter: Payer: Self-pay | Admitting: *Deleted

## 2020-12-24 ENCOUNTER — Other Ambulatory Visit: Payer: Self-pay

## 2020-12-24 ENCOUNTER — Encounter: Payer: Self-pay | Admitting: Oncology

## 2020-12-24 ENCOUNTER — Inpatient Hospital Stay: Payer: Medicare Other

## 2020-12-24 ENCOUNTER — Inpatient Hospital Stay: Payer: Medicare Other | Attending: Oncology | Admitting: Oncology

## 2020-12-24 ENCOUNTER — Ambulatory Visit: Payer: Medicare Other | Admitting: Physical Therapy

## 2020-12-24 ENCOUNTER — Ambulatory Visit
Admission: RE | Admit: 2020-12-24 | Discharge: 2020-12-24 | Disposition: A | Discharge status: Home / Self Care | Payer: Medicare Other | Source: Ambulatory Visit | Attending: Radiation Oncology | Admitting: Radiation Oncology

## 2020-12-24 ENCOUNTER — Encounter: Payer: Self-pay | Admitting: Licensed Clinical Social Worker

## 2020-12-24 VITALS — BP 147/74 | HR 83 | Temp 97.3°F | Resp 18 | Ht 65.5 in | Wt 168.5 lb

## 2020-12-24 DIAGNOSIS — I10 Essential (primary) hypertension: Secondary | ICD-10-CM | POA: Insufficient documentation

## 2020-12-24 DIAGNOSIS — D0512 Intraductal carcinoma in situ of left breast: Secondary | ICD-10-CM

## 2020-12-24 DIAGNOSIS — Z808 Family history of malignant neoplasm of other organs or systems: Secondary | ICD-10-CM | POA: Insufficient documentation

## 2020-12-24 DIAGNOSIS — Z85828 Personal history of other malignant neoplasm of skin: Secondary | ICD-10-CM | POA: Insufficient documentation

## 2020-12-24 DIAGNOSIS — Z170 Estrogen receptor positive status [ER+]: Secondary | ICD-10-CM | POA: Diagnosis not present

## 2020-12-24 DIAGNOSIS — Z79899 Other long term (current) drug therapy: Secondary | ICD-10-CM | POA: Diagnosis not present

## 2020-12-24 LAB — CMP (CANCER CENTER ONLY)
ALT: 25 U/L (ref 0–44)
AST: 24 U/L (ref 15–41)
Albumin: 3.9 g/dL (ref 3.5–5.0)
Alkaline Phosphatase: 92 U/L (ref 38–126)
Anion gap: 10 (ref 5–15)
BUN: 10 mg/dL (ref 8–23)
CO2: 26 mmol/L (ref 22–32)
Calcium: 10.2 mg/dL (ref 8.9–10.3)
Chloride: 105 mmol/L (ref 98–111)
Creatinine: 0.67 mg/dL (ref 0.44–1.00)
GFR, Estimated: >60 mL/min (ref >60)
Glucose, Bld: 95 mg/dL (ref 70–99)
Potassium: 4.2 mmol/L (ref 3.5–5.1)
Sodium: 141 mmol/L (ref 135–145)
Total Bilirubin: 0.6 mg/dL (ref 0.3–1.2)
Total Protein: 6.6 g/dL (ref 6.5–8.1)

## 2020-12-24 LAB — CBC WITH DIFFERENTIAL (CANCER CENTER ONLY)
Abs Immature Granulocytes: 0.01 10*3/uL (ref 0.00–0.07)
Basophils Absolute: 0 10*3/uL (ref 0.0–0.1)
Basophils Relative: 0 %
Eosinophils Absolute: 0.1 10*3/uL (ref 0.0–0.5)
Eosinophils Relative: 1 %
HCT: 42.5 % (ref 36.0–46.0)
Hemoglobin: 14.5 g/dL (ref 12.0–15.0)
Immature Granulocytes: 0 %
Lymphocytes Relative: 13 %
Lymphs Abs: 0.8 10*3/uL (ref 0.7–4.0)
MCH: 31.7 pg (ref 26.0–34.0)
MCHC: 34.1 g/dL (ref 30.0–36.0)
MCV: 93 fL (ref 80.0–100.0)
Monocytes Absolute: 0.6 10*3/uL (ref 0.1–1.0)
Monocytes Relative: 9 %
Neutro Abs: 4.8 10*3/uL (ref 1.7–7.7)
Neutrophils Relative %: 77 %
Platelet Count: 156 10*3/uL (ref 150–400)
RBC: 4.57 MIL/uL (ref 3.87–5.11)
RDW: 12.8 % (ref 11.5–15.5)
WBC Count: 6.3 10*3/uL (ref 4.0–10.5)
nRBC: 0 % (ref 0.0–0.2)

## 2020-12-24 LAB — GENETIC SCREENING ORDER

## 2020-12-24 NOTE — H&P (View-Only) (Signed)
Barbara Mccoy Appointment: 12/24/2020 9:00 AM Location: [facility] Patient #: [id number] DOB: 1933/07/01 Undefined / Language: Barbara Mccoy / Race: White Female  History of Present Illness Barbara Mccoy A. Majesty Stehlin MD; 12/24/2020 11:06 AM) Patient words: Patient presents to the [facility] today for evaluation of abnormal left breast mammogram. She underwent screening mammogram and subsequent diagnostic mammogram which showed a 7 mm focus left breast upper outer quadrant core biopsy proven high-grade DCIS. Arthritis this was both estrogen and progesterone positive. She has no complaints of breast pain, breast mass or nipple discharge. She is in excellent health and very active. No family history of breast cancer that she is aware of.  The patient is a 87 year old female.   Past Surgical History Barbara Slipper, RN; 12/24/2020 8:02 AM) Breast Biopsy Left. Hysterectomy (not due to cancer) - Complete Open Inguinal Hernia Surgery Right. Tonsillectomy  Diagnostic Studies History Barbara Slipper, RN; 12/24/2020 8:02 AM) Colonoscopy >10 years ago Mammogram within last year Pap Smear >5 years ago  Medication History Barbara Slipper, RN; 12/24/2020 8:02 AM) Medications Reconciled  Social History Barbara Slipper, RN; 12/24/2020 8:02 AM) No drug use  Family History Barbara Slipper, RN; 12/24/2020 8:02 AM) Cerebrovascular Accident Family Members In General, Father. Depression Father, Mother. Migraine Headache Family Members In General, Mother.  Pregnancy / Birth History Barbara Slipper, RN; 12/24/2020 8:02 AM) Irregular periods Maternal age 75-25  Other Problems Barbara Slipper, RN; 12/24/2020 8:02 AM) Depression Hemorrhoids High blood pressure Inguinal Hernia Lump In Breast Melanoma Migraine Headache     Review of Systems Barbara Slipper RN; 12/24/2020 8:02 AM) General Present- Fatigue and Night Sweats. Not Present- Appetite Loss, Chills, Fever, Weight Gain and Weight Loss. Skin  Not Present- Change in Wart/Mole, Dryness, Hives, Jaundice, New Lesions, Non-Healing Wounds, Rash and Ulcer. HEENT Present- Wears glasses/contact lenses. Not Present- Earache, Hearing Loss, Hoarseness, Nose Bleed, Oral Ulcers, Ringing in the Ears, Seasonal Allergies, Sinus Pain, Sore Throat, Visual Disturbances and Yellow Eyes. Respiratory Not Present- Bloody sputum, Chronic Cough, Difficulty Breathing, Snoring and Wheezing. Breast Not Present- Breast Mass, Breast Pain, Nipple Discharge and Skin Changes. Cardiovascular Present- Leg Cramps. Not Present- Chest Pain, Difficulty Breathing Lying Down, Palpitations, Rapid Heart Rate, Shortness of Breath and Swelling of Extremities. Female Genitourinary Not Present- Frequency, Nocturia, Painful Urination, Pelvic Pain and Urgency. Musculoskeletal Not Present- Back Pain, Joint Pain, Joint Stiffness, Muscle Pain, Muscle Weakness and Swelling of Extremities. Neurological Not Present- Decreased Memory, Fainting, Headaches, Numbness, Seizures, Tingling, Tremor, Trouble walking and Weakness. Psychiatric Not Present- Anxiety, Bipolar, Change in Sleep Pattern, Depression, Fearful and Frequent crying. Endocrine Not Present- Cold Intolerance, Excessive Hunger, Hair Changes, Heat Intolerance, Hot flashes and New Diabetes. Hematology Not Present- Blood Thinners, Easy Bruising, Excessive bleeding, Gland problems, HIV and Persistent Infections.   Physical Exam (Barbara Mccoy A. Barbara Ewart MD; 12/24/2020 11:07 AM)  General Mental Status-Alert. General Appearance-Consistent with stated age. Hydration-Well hydrated. Voice-Normal.  Head and Neck Head-normocephalic, atraumatic with no lesions or palpable masses. Trachea-midline. Thyroid Gland Characteristics - normal size and consistency.  Chest and Lung Exam Note: Work of breathing normal, no wheezing  Breast Breast - Left-Symmetric, Non Tender, No Biopsy scars, no Dimpling - Left, No Inflammation, No  Lumpectomy scars, No Mastectomy scars, No Peau d' Orange. Breast - Right-Symmetric, Non Tender, No Biopsy scars, no Dimpling - Right, No Inflammation, No Lumpectomy scars, No Mastectomy scars, No Peau d' Orange. Breast Lump-No Palpable Breast Mass.  Cardiovascular Note: Normal sinus rhythm, no tachycardia  Neurologic Neurologic evaluation reveals -alert  and oriented x 3 with no impairment of recent or remote memory. Mental Status-Normal.  Musculoskeletal Normal Exam - Left-Upper Extremity Strength Normal and Lower Extremity Strength Normal. Normal Exam - Right-Upper Extremity Strength Normal and Lower Extremity Strength Normal.  Lymphatic Axillary  General Axillary Region: Bilateral - Description - Normal. Tenderness - Non Tender.    Assessment & Plan (Barbara Mccoy A. Barbara Cuadras MD; 12/24/2020 11:10 AM)  BREAST NEOPLASM, TIS (DCIS), LEFT (D05.12) Impression: Discussed medical and surgical options of treatment. She does not qualify for COMET trial due to high-grade nature  Discussed breast conserving surgery and mastectomy. Discussed the pros and cons of each and long-term expectations of each. Discussed recurrence rates of each. She has opted for left breast seed localized lumpectomy.  Total time 45 minutes   Risks and benefits of surgery discussed as well as complications.  Risk of lumpectomy include bleeding, infection, seroma, more surgery, use of seed/wire, wound care, cosmetic deformity and the need for other treatments, death , blood clots, death. Pt agrees to proceed.  Current Plans You are being scheduled for surgery- Our schedulers will call you.  You should hear from our office's scheduling department within 5 working days about the location, date, and time of surgery. We try to make accommodations for patient's preferences in scheduling surgery, but sometimes the OR schedule or the surgeon's schedule prevents [place] from making those accommodations.  If you have  not heard from our office [phone]) in 5 working days, call the office and ask for your surgeon's nurse.  If you have other questions about your diagnosis, plan, or surgery, call the office and ask for your surgeon's nurse.  Pt Education - [facility] Breast Biopsy HCI: discussed with patient and provided information. We discussed the staging and pathophysiology of breast cancer. We discussed all of the different options for treatment for breast cancer including surgery, chemotherapy, radiation therapy, Herceptin, and antiestrogen therapy. We discussed a sentinel lymph node biopsy as she does not appear to having lymph node involvement right now. We discussed the performance of that with injection of radioactive tracer and blue dye. We discussed that she would have an incision underneath her axillary hairline. We discussed that there is a bout a 10-20% chance of having a positive node with a sentinel lymph node biopsy and we will await the permanent pathology to make any other first further decisions in terms of her treatment. One of these options might be to return to the operating room to perform an axillary lymph node dissection. We discussed about a 1-2% risk lifetime of chronic shoulder pain as well as lymphedema associated with a sentinel lymph node biopsy. We discussed the options for treatment of the breast cancer which included lumpectomy versus a mastectomy. We discussed the performance of the lumpectomy with a wire placement. We discussed a 10-20% chance of a positive margin requiring reexcision in the operating room. We also discussed that she may need radiation therapy or antiestrogen therapy or both if she undergoes lumpectomy. We discussed the mastectomy and the postoperative care for that as well. We discussed that there is no difference in her survival whether she undergoes lumpectomy with radiation therapy or antiestrogen therapy versus a mastectomy. There is a slight difference in the local  recurrence rate being 3-5% with lumpectomy and about 1% with a mastectomy. We discussed the risks of operation including bleeding, infection, possible reoperation. She understands her further therapy will be based on what her stages at the time of her operation.  Pt  Education - Pamphlet Given - Breast Biopsy: discussed with patient and provided information.

## 2020-12-24 NOTE — Progress Notes (Signed)
Radiation Oncology         [phone] ________________________________  Initial Outpatient Consultation  Name: Barbara Mccoy MRN: [id number]  Date: 12/24/2020  DOB: September 05, 1932  [id number], Delfino Lovett, MD  Erroll Luna, MD   REFERRING PHYSICIAN: Erroll Luna, MD  DIAGNOSIS:    ICD-10-CM   1. Ductal carcinoma in situ (DCIS) of left breast  D05.12     Cancer Staging Ductal carcinoma in situ (DCIS) of left breast Staging form: Breast, AJCC 8th Edition - Clinical stage from 12/24/2020: Stage 0 (cTis (DCIS), cN0, cM0, ER+, PR+, HER2: Not Assessed) - Unsigned Stage prefix: Initial diagnosis Nuclear grade: G3 Laterality: Left Staged by: Pathologist and managing physician Stage used in treatment planning: Yes National guidelines used in treatment planning: Yes Type of national guideline used in treatment planning: NCCN   CHIEF COMPLAINT: Here to discuss management of left breast cancer  HISTORY OF PRESENT ILLNESS::Barbara Mccoy is a 87 y.o. female who presented with left breast abnormality on the following imaging: bilateral screening mammogram on the date of 11/10/2020. No symptoms were reported at that time. Left diagnostic mammogram on 11/20/2020 revealed a 7 mm cluster of amorphous calcifications in the left breast that were suspicious for Malignancy.  Imaging and pathology results were reviewed this morning at our tumor board.  Biopsy of the left breast revealed high-grade DCIS with necrosis.  Her disease is ER positive.  She is retired Warden/ranger.  She is here with a supportive friend today.  She is otherwise in her usual state of health.  She reports that she had minimal discomfort from her recent biopsy.  Her mother lived into her 47s.  Her father died in his 25s of an astrocytoma.  PREVIOUS RADIATION THERAPY: No  PAST MEDICAL HISTORY:  has a past medical history of Cataract, Hypertension, Irritable bowel (1950), Macular degeneration, wet ([place]), Migraine headache (1950), and  Squamous cell carcinoma of skin of chest (08/05/2020).    PAST SURGICAL HISTORY: Past Surgical History:  Procedure Laterality Date  . CATARACT EXTRACTION, BILATERAL    . INGUINAL HERNIA REPAIR     in 40's  . SKIN CANCER EXCISION    . TONSILLECTOMY     in childhood  . TOTAL ABDOMINAL HYSTERECTOMY     w/ BSO, in 40's    FAMILY HISTORY: family history includes Brain cancer in her father.  SOCIAL HISTORY:  reports that she has never smoked. She has never used smokeless tobacco. She reports current alcohol use of about 5.0 standard drinks of alcohol per week.  ALLERGIES: Codeine  MEDICATIONS:  Current Outpatient Medications  Medication Sig Dispense Refill  . aspirin 81 MG tablet Take 81 mg by mouth daily.    . calcium carbonate (OS-CAL) 600 MG TABS tablet Take 600 mg by mouth 2 (two) times daily with a meal.    . Multiple Vitamins-Minerals (ICAPS AREDS 2 PO) Take by mouth.    . simvastatin (ZOCOR) 20 MG tablet Take 20 mg by mouth daily.     No current facility-administered medications for this encounter.    REVIEW OF SYSTEMS: as above   PHYSICAL EXAM:  vitals were not taken for this visit.   General: Alert and oriented, in no acute distress Neck: no supraclavicular lymphadenopathy. Heart: Regular in rate and rhythm with no murmurs, rubs, or gallops. Chest: Clear to auscultation bilaterally, with no rhonchi, wheezes, or rales. Psychiatric: Judgment and insight are intact. Affect is appropriate. Breasts: There is bruising in the lower outer quadrant of the  left breast related to recent biopsy. No  palpable masses appreciated in the breasts or axillae bilaterally.   ECOG = 0  0 - Asymptomatic (Fully active, able to carry on all predisease activities without restriction)  1 - Symptomatic but completely ambulatory (Restricted in physically strenuous activity but ambulatory and able to carry out work of a light or sedentary nature. For example, light housework, office work)  2 -  Symptomatic, <50% in bed during the day (Ambulatory and capable of all self care but unable to carry out any work activities. Up and about more than 50% of waking hours)  3 - Symptomatic, >50% in bed, but not bedbound (Capable of only limited self-care, confined to bed or chair 50% or more of waking hours)  4 - Bedbound (Completely disabled. Cannot carry on any self-care. Totally confined to bed or chair)  5 - Death   Eustace Pen MM, Creech RH, Tormey DC, et al. [phone]). "Toxicity and response criteria of the [facility]". [place] Oncol. 5 (6): 649-55   LABORATORY DATA:  Lab Results  Component Value Date   WBC 6.3 12/24/2020   HGB 14.5 12/24/2020   HCT 42.5 12/24/2020   MCV 93.0 12/24/2020   PLT 156 12/24/2020   CMP     Component Value Date/Time   NA 141 12/24/2020 0816   K 4.2 12/24/2020 0816   CL 105 12/24/2020 0816   CO2 26 12/24/2020 0816   GLUCOSE 95 12/24/2020 0816   BUN 10 12/24/2020 0816   CREATININE 0.67 12/24/2020 0816   CALCIUM 10.2 12/24/2020 0816   PROT 6.6 12/24/2020 0816   ALBUMIN 3.9 12/24/2020 0816   AST 24 12/24/2020 0816   ALT 25 12/24/2020 0816   ALKPHOS 92 12/24/2020 0816   BILITOT 0.6 12/24/2020 0816   GFRNONAA >60 12/24/2020 0816         RADIOGRAPHY: Intravitreal Injection, Pharmacologic Agent - OS - Left Eye  Result Date: 12/08/2020 Time Out 12/08/2020. 1:36 PM. Confirmed correct patient, procedure, site, and patient consented. Anesthesia Topical anesthesia was used. Anesthetic medications included Akten 3.5%. Procedure Preparation included Ofloxacin , 10% betadine to eyelids, 5% betadine to ocular surface. A 30 gauge needle was used. Injection: 2.5 mg Bevacizumab (AVASTIN) 2.5mg /0.51mL SOSY   NDC: 86578-469-62, Lot: [id number]   Route: Intravitreal, Site: Left Eye Post-op Post injection exam found visual acuity of at least counting fingers. The patient tolerated the procedure well. There were no complications. The patient received  written and verbal post procedure care education. Post injection medications were not given.   OCT, Retina - OU - Both Eyes  Result Date: 12/08/2020 Right Eye Quality was good. Scan locations included subfoveal. Central Foveal Thickness: 240. Progression has improved. Findings include vitreomacular adhesion , abnormal foveal contour, retinal drusen , no SRF, no IRF. Left Eye Quality was good. Scan locations included subfoveal. Central Foveal Thickness: 233. Progression has been stable. Findings include vitreomacular adhesion , no IRF, no SRF, retinal drusen . Notes Overall improved macular findings each eye.  Juxta foveal CNVM, no signs of intraretinal fluid or CME in either eye. OD currently at 1 planned  11 week follow-up upcoming, 6 weeks post last injection Today OS at 9 weeks follow-up post Avastin, will repeat injection today and extend the interval in the left eye also to 10 weeks     IMPRESSION/PLAN: Left breast cancer  It was a pleasure meeting the patient today.  I let her know that our team discussed her multidisciplinary  care and conference today.  The consensus is that she is an excellent candidate for breast conserving surgery.  The consensus is also that she would benefit from 1 form of adjuvant therapy.  Her options are to take an antiestrogen pill or receive radiation therapy. Given the systemic risks of taking an antiestrogen pill we believe that radiation therapy would have a better risk-benefit ratio for her.  She is enthusiastic about radiation therapy.  We discussed the risks, benefits, and side effects of radiotherapy. I recommend radiotherapy to the left breast to reduce her risk of locoregional recurrence.  We discussed that radiation would take approximately 3-4 weeks to complete and that I would give the patient a few weeks to heal following surgery before starting treatment planning.  We spoke about acute effects including skin irritation and fatigue as well as much less common  late effects including internal organ injury or irritation. We spoke about the latest technology that is used to minimize the risk of late effects for patients undergoing radiotherapy to the breast or chest wall. No guarantees of treatment were given. The patient is enthusiastic about proceeding with treatment. I look forward to participating in the patient's care.  I will await her referral back to me for postoperative follow-up and eventual CT simulation/treatment planning.  On date of service, in total, I spent 45 minutes on this encounter. Patient was seen in person.   __________________________________________   Eppie Gibson, MD  This document serves as a record of services personally performed by Eppie Gibson, MD. It was created on his behalf by Clerance Lav, a trained medical scribe. The creation of this record is based on the scribe's personal observations and the provider's statements to them. This document has been checked and approved by the attending provider.

## 2020-12-24 NOTE — Progress Notes (Signed)
[place] Work  Initial Assessment   JAMIKA SADEK is a 87 y.o. year old female accompanied by patient and friend, Judson Roch. Clinical Social Work was referred by [facility] for assessment of psychosocial needs.   SDOH (Social Determinants of Health) assessments performed: Yes SDOH Interventions   Flowsheet Row Most Recent Value  SDOH Interventions   Food Insecurity Interventions Intervention Not Indicated  Financial Strain Interventions Intervention Not Indicated  Housing Interventions Intervention Not Indicated  Transportation Interventions Intervention Not Indicated      Distress Screen completed: Yes ONCBCN DISTRESS SCREENING 12/24/2020  Screening Type Initial Screening  Distress experienced in past week (1-10) 7  Emotional problem type Depression;Adjusting to appearance changes  Information Concerns Type Lack of info about diagnosis;Lack of info about complementary therapy choices;Lack of info about treatment;Lack of info about maintaining fitness  Physical Problem type Pain;Sleep/insomnia;Getting around;Bathing/dressing;Constipation/diarrhea;Sexual problems;Swollen arms/legs  *Distress has decreased after speaking with medical team   Family/Social Information:  . Housing Arrangement: patient lives alone at [place]. She is widowed . Family members/support persons in your life? Friends, community, family . Transportation concerns: no  . Employment: Retired Marine scientist. Income source: Conservation officer, historic buildings and Retirement savings . Financial concerns: No o Type of concern: None . Food access concerns: no . Medication Concerns: no  . Services Currently in place:  N/a (can access services through [employer] as needed for additional support)  Coping/ Adjustment to diagnosis: . Patient understands treatment plan and what happens next? yes, feels much better after hearing the stage and treatment plan . Concerns about diagnosis and/or treatment: I'm not especially  worried about anything . Patient reported stressors: no particular stressors after hearing the plan . Hopes and priorities: to continue to engage in her many hobbies and interests . Patient enjoys being part of the chorus, working in [employer], exercise, [employer], gardening, Firefighter . Current coping skills/ strengths: Ability for insight, Average or above average intelligence, Capable of independent living, Communication skills, Motivation for treatment/growth and Special hobby/interest    SUMMARY: Current SDOH Barriers:  . No significant SDOH barriers noted today  Clinical Social Work Clinical Goal(s):  Marland Kitchen No clinical SW goals at this time  Interventions: . Discussed common feeling and emotions when being diagnosed with cancer, and the importance of support during treatment . Informed patient of the support team roles and support services at [facility] . Provided CSW contact information and encouraged patient to call with any questions or concerns  Follow Up Plan: Patient will contact CSW with any support or resource needs Patient verbalizes understanding of plan: Yes    Christeen Douglas , LCSW

## 2020-12-24 NOTE — H&P (Signed)
Beverely Pace Appointment: 12/24/2020 9:00 AM Location: [facility] Patient #: [phone] DOB: Dec 03, 1932 Undefined / Language: [name] / Race: White Female  History of Present Illness Marcello Moores A. Shania Bjelland MD; 12/24/2020 11:06 AM) Patient words: Patient presents to the [facility] today for evaluation of abnormal left breast mammogram. She underwent screening mammogram and subsequent diagnostic mammogram which showed a 7 mm focus left breast upper outer quadrant core biopsy proven high-grade DCIS. Arthritis this was both estrogen and progesterone positive. She has no complaints of breast pain, breast mass or nipple discharge. She is in excellent health and very active. No family history of breast cancer that she is aware of.  The patient is a 87 year old female.   Past Surgical History Conni Slipper, RN; 12/24/2020 8:02 AM) Breast Biopsy Left. Hysterectomy (not due to cancer) - Complete Open Inguinal Hernia Surgery Right. Tonsillectomy  Diagnostic Studies History Conni Slipper, RN; 12/24/2020 8:02 AM) Colonoscopy >10 years ago Mammogram within last year Pap Smear >5 years ago  Medication History Conni Slipper, RN; 12/24/2020 8:02 AM) Medications Reconciled  Social History Conni Slipper, RN; 12/24/2020 8:02 AM) No drug use  Family History Conni Slipper, RN; 12/24/2020 8:02 AM) Cerebrovascular Accident Family Members In General, Father. Depression Father, Mother. Migraine Headache Family Members In General, Mother.  Pregnancy / Birth History Conni Slipper, RN; 12/24/2020 8:02 AM) Irregular periods Maternal age 4-25  Other Problems Conni Slipper, RN; 12/24/2020 8:02 AM) Depression Hemorrhoids High blood pressure Inguinal Hernia Lump In Breast Melanoma Migraine Headache     Review of Systems Conni Slipper RN; 12/24/2020 8:02 AM) General Present- Fatigue and Night Sweats. Not Present- Appetite Loss, Chills, Fever, Weight Gain and Weight Loss. Skin  Not Present- Change in Wart/Mole, Dryness, Hives, Jaundice, New Lesions, Non-Healing Wounds, Rash and Ulcer. HEENT Present- Wears glasses/contact lenses. Not Present- Earache, Hearing Loss, Hoarseness, Nose Bleed, Oral Ulcers, Ringing in the Ears, Seasonal Allergies, Sinus Pain, Sore Throat, Visual Disturbances and Yellow Eyes. Respiratory Not Present- Bloody sputum, Chronic Cough, Difficulty Breathing, Snoring and Wheezing. Breast Not Present- Breast Mass, Breast Pain, Nipple Discharge and Skin Changes. Cardiovascular Present- Leg Cramps. Not Present- Chest Pain, Difficulty Breathing Lying Down, Palpitations, Rapid Heart Rate, Shortness of Breath and Swelling of Extremities. Female Genitourinary Not Present- Frequency, Nocturia, Painful Urination, Pelvic Pain and Urgency. Musculoskeletal Not Present- Back Pain, Joint Pain, Joint Stiffness, Muscle Pain, Muscle Weakness and Swelling of Extremities. Neurological Not Present- Decreased Memory, Fainting, Headaches, Numbness, Seizures, Tingling, Tremor, Trouble walking and Weakness. Psychiatric Not Present- Anxiety, Bipolar, Change in Sleep Pattern, Depression, Fearful and Frequent crying. Endocrine Not Present- Cold Intolerance, Excessive Hunger, Hair Changes, Heat Intolerance, Hot flashes and New Diabetes. Hematology Not Present- Blood Thinners, Easy Bruising, Excessive bleeding, Gland problems, HIV and Persistent Infections.   Physical Exam (Shaughn Thomley A. Circe Chilton MD; 12/24/2020 11:07 AM)  General Mental Status-Alert. General Appearance-Consistent with stated age. Hydration-Well hydrated. Voice-Normal.  Head and Neck Head-normocephalic, atraumatic with no lesions or palpable masses. Trachea-midline. Thyroid Gland Characteristics - normal size and consistency.  Chest and Lung Exam Note: Work of breathing normal, no wheezing  Breast Breast - Left-Symmetric, Non Tender, No Biopsy scars, no Dimpling - Left, No Inflammation, No  Lumpectomy scars, No Mastectomy scars, No Peau d' Orange. Breast - Right-Symmetric, Non Tender, No Biopsy scars, no Dimpling - Right, No Inflammation, No Lumpectomy scars, No Mastectomy scars, No Peau d' Orange. Breast Lump-No Palpable Breast Mass.  Cardiovascular Note: Normal sinus rhythm, no tachycardia  Neurologic Neurologic evaluation reveals -alert  and oriented x 3 with no impairment of recent or remote memory. Mental Status-Normal.  Musculoskeletal Normal Exam - Left-Upper Extremity Strength Normal and Lower Extremity Strength Normal. Normal Exam - Right-Upper Extremity Strength Normal and Lower Extremity Strength Normal.  Lymphatic Axillary  General Axillary Region: Bilateral - Description - Normal. Tenderness - Non Tender.    Assessment & Plan (Sheretta Grumbine A. Kendahl Bumgardner MD; 12/24/2020 11:10 AM)  BREAST NEOPLASM, TIS (DCIS), LEFT (D05.12) Impression: Discussed medical and surgical options of treatment. She does not qualify for COMET trial due to high-grade nature  Discussed breast conserving surgery and mastectomy. Discussed the pros and cons of each and long-term expectations of each. Discussed recurrence rates of each. She has opted for left breast seed localized lumpectomy.  Total time 45 minutes   Risks and benefits of surgery discussed as well as complications.  Risk of lumpectomy include bleeding, infection, seroma, more surgery, use of seed/wire, wound care, cosmetic deformity and the need for other treatments, death , blood clots, death. Pt agrees to proceed.  Current Plans You are being scheduled for surgery- Our schedulers will call you.  You should hear from our office's scheduling department within 5 working days about the location, date, and time of surgery. We try to make accommodations for patient's preferences in scheduling surgery, but sometimes the OR schedule or the surgeon's schedule prevents [place] from making those accommodations.  If you have  not heard from our office [phone]) in 5 working days, call the office and ask for your surgeon's nurse.  If you have other questions about your diagnosis, plan, or surgery, call the office and ask for your surgeon's nurse.  Pt Education - [facility] Breast Biopsy HCI: discussed with patient and provided information. We discussed the staging and pathophysiology of breast cancer. We discussed all of the different options for treatment for breast cancer including surgery, chemotherapy, radiation therapy, Herceptin, and antiestrogen therapy. We discussed a sentinel lymph node biopsy as she does not appear to having lymph node involvement right now. We discussed the performance of that with injection of radioactive tracer and blue dye. We discussed that she would have an incision underneath her axillary hairline. We discussed that there is a bout a 10-20% chance of having a positive node with a sentinel lymph node biopsy and we will await the permanent pathology to make any other first further decisions in terms of her treatment. One of these options might be to return to the operating room to perform an axillary lymph node dissection. We discussed about a 1-2% risk lifetime of chronic shoulder pain as well as lymphedema associated with a sentinel lymph node biopsy. We discussed the options for treatment of the breast cancer which included lumpectomy versus a mastectomy. We discussed the performance of the lumpectomy with a wire placement. We discussed a 10-20% chance of a positive margin requiring reexcision in the operating room. We also discussed that she may need radiation therapy or antiestrogen therapy or both if she undergoes lumpectomy. We discussed the mastectomy and the postoperative care for that as well. We discussed that there is no difference in her survival whether she undergoes lumpectomy with radiation therapy or antiestrogen therapy versus a mastectomy. There is a slight difference in the local  recurrence rate being 3-5% with lumpectomy and about 1% with a mastectomy. We discussed the risks of operation including bleeding, infection, possible reoperation. She understands her further therapy will be based on what her stages at the time of her operation.  Pt  Education - Pamphlet Given - Breast Biopsy: discussed with patient and provided information.

## 2020-12-25 ENCOUNTER — Other Ambulatory Visit: Payer: Self-pay

## 2020-12-25 ENCOUNTER — Telehealth: Payer: Self-pay | Admitting: Oncology

## 2020-12-25 ENCOUNTER — Encounter: Payer: Self-pay | Admitting: *Deleted

## 2020-12-25 ENCOUNTER — Encounter (HOSPITAL_BASED_OUTPATIENT_CLINIC_OR_DEPARTMENT_OTHER): Payer: Self-pay | Admitting: Surgery

## 2020-12-25 DIAGNOSIS — D0512 Intraductal carcinoma in situ of left breast: Secondary | ICD-10-CM

## 2020-12-25 NOTE — Telephone Encounter (Signed)
Scheduled appointment per 05/25 los. Patient is aware.

## 2020-12-26 ENCOUNTER — Encounter (HOSPITAL_BASED_OUTPATIENT_CLINIC_OR_DEPARTMENT_OTHER)
Admission: RE | Admit: 2020-12-26 | Discharge: 2020-12-26 | Disposition: A | Discharge status: Home / Self Care | Payer: Medicare Other | Source: Ambulatory Visit | Attending: Surgery | Admitting: Surgery

## 2020-12-26 DIAGNOSIS — Z01812 Encounter for preprocedural laboratory examination: Secondary | ICD-10-CM | POA: Insufficient documentation

## 2020-12-26 LAB — CBC WITH DIFFERENTIAL/PLATELET
Abs Immature Granulocytes: 0.03 10*3/uL (ref 0.00–0.07)
Basophils Absolute: 0 10*3/uL (ref 0.0–0.1)
Basophils Relative: 1 %
Eosinophils Absolute: 0.1 10*3/uL (ref 0.0–0.5)
Eosinophils Relative: 1 %
HCT: 43.2 % (ref 36.0–46.0)
Hemoglobin: 14.5 g/dL (ref 12.0–15.0)
Immature Granulocytes: 1 %
Lymphocytes Relative: 14 %
Lymphs Abs: 0.8 10*3/uL (ref 0.7–4.0)
MCH: 31.4 pg (ref 26.0–34.0)
MCHC: 33.6 g/dL (ref 30.0–36.0)
MCV: 93.5 fL (ref 80.0–100.0)
Monocytes Absolute: 0.6 10*3/uL (ref 0.1–1.0)
Monocytes Relative: 10 %
Neutro Abs: 4.3 10*3/uL (ref 1.7–7.7)
Neutrophils Relative %: 73 %
Platelets: 184 10*3/uL (ref 150–400)
RBC: 4.62 MIL/uL (ref 3.87–5.11)
RDW: 13 % (ref 11.5–15.5)
WBC: 5.8 10*3/uL (ref 4.0–10.5)
nRBC: 0 % (ref 0.0–0.2)

## 2020-12-26 LAB — COMPREHENSIVE METABOLIC PANEL
ALT: 23 U/L (ref 0–44)
AST: 23 U/L (ref 15–41)
Albumin: 4 g/dL (ref 3.5–5.0)
Alkaline Phosphatase: 74 U/L (ref 38–126)
Anion gap: 5 (ref 5–15)
BUN: 10 mg/dL (ref 8–23)
CO2: 26 mmol/L (ref 22–32)
Calcium: 10 mg/dL (ref 8.9–10.3)
Chloride: 104 mmol/L (ref 98–111)
Creatinine, Ser: 0.68 mg/dL (ref 0.44–1.00)
GFR, Estimated: >60 mL/min (ref >60)
Glucose, Bld: 101 mg/dL — ABNORMAL HIGH (ref 70–99)
Potassium: 4.9 mmol/L (ref 3.5–5.1)
Sodium: 135 mmol/L (ref 135–145)
Total Bilirubin: 0.8 mg/dL (ref 0.3–1.2)
Total Protein: 6.3 g/dL — ABNORMAL LOW (ref 6.5–8.1)

## 2020-12-26 NOTE — Progress Notes (Signed)

## 2020-12-30 ENCOUNTER — Inpatient Hospital Stay
Admission: RE | Admit: 2020-12-30 | Discharge: 2020-12-30 | Disposition: A | Discharge status: Home / Self Care | Payer: Self-pay | Source: Ambulatory Visit | Attending: Radiation Oncology | Admitting: Radiation Oncology

## 2020-12-30 ENCOUNTER — Telehealth: Payer: Self-pay

## 2020-12-30 ENCOUNTER — Other Ambulatory Visit: Payer: Self-pay | Admitting: Radiation Oncology

## 2020-12-30 DIAGNOSIS — D0512 Intraductal carcinoma in situ of left breast: Secondary | ICD-10-CM

## 2020-12-30 NOTE — Telephone Encounter (Signed)
Received VM from patient stating she forgot to mention that she receives an injection in both her eyes every 6-9 weeks to help manage her macular degeneration. She is scheduled for her next injection in July (around the time she's slated to start radiation), and she wanted to ensure it was safe for her to receive during active radiation for her breast cancer. Updated Dr. Isidore Moos who stated treatment field will not be near patient's eyes, so she will be fine to continue to receive injections as needed.   Returned patient's call and left a voicemail detailing above information. Provided my direct call back number should she have any other questions/concerns before her appointment in radiation oncology in July.

## 2021-01-01 ENCOUNTER — Telehealth: Payer: Self-pay | Admitting: *Deleted

## 2021-01-01 ENCOUNTER — Encounter: Payer: Self-pay | Admitting: *Deleted

## 2021-01-01 DIAGNOSIS — C50512 Malignant neoplasm of lower-outer quadrant of left female breast: Secondary | ICD-10-CM | POA: Diagnosis not present

## 2021-01-01 NOTE — Telephone Encounter (Signed)
Spoke with patient to follow up from [facility] 5/25 and assess navigation needs. Patient denies any questions or concerns at this time. Encouraged her to call should anything arise. Patient verbalized understanding.

## 2021-01-02 ENCOUNTER — Telehealth: Payer: Self-pay | Admitting: Cardiology

## 2021-01-02 ENCOUNTER — Encounter (HOSPITAL_BASED_OUTPATIENT_CLINIC_OR_DEPARTMENT_OTHER): Payer: Self-pay | Admitting: Surgery

## 2021-01-02 ENCOUNTER — Ambulatory Visit (HOSPITAL_BASED_OUTPATIENT_CLINIC_OR_DEPARTMENT_OTHER): Payer: Medicare Other | Admitting: Certified Registered"

## 2021-01-02 ENCOUNTER — Ambulatory Visit (HOSPITAL_BASED_OUTPATIENT_CLINIC_OR_DEPARTMENT_OTHER)
Admission: RE | Admit: 2021-01-02 | Discharge: 2021-01-02 | Disposition: A | Discharge status: Home / Self Care | Payer: Medicare Other | Attending: Surgery | Admitting: Surgery

## 2021-01-02 ENCOUNTER — Other Ambulatory Visit: Payer: Self-pay

## 2021-01-02 ENCOUNTER — Encounter (HOSPITAL_BASED_OUTPATIENT_CLINIC_OR_DEPARTMENT_OTHER)
Admission: RE | Disposition: A | Discharge status: Home / Self Care | Payer: Self-pay | Source: Home / Self Care | Attending: Surgery

## 2021-01-02 DIAGNOSIS — Z90710 Acquired absence of both cervix and uterus: Secondary | ICD-10-CM | POA: Diagnosis not present

## 2021-01-02 DIAGNOSIS — N6092 Unspecified benign mammary dysplasia of left breast: Secondary | ICD-10-CM | POA: Insufficient documentation

## 2021-01-02 DIAGNOSIS — C50912 Malignant neoplasm of unspecified site of left female breast: Secondary | ICD-10-CM | POA: Diagnosis not present

## 2021-01-02 DIAGNOSIS — Z85820 Personal history of malignant melanoma of skin: Secondary | ICD-10-CM | POA: Insufficient documentation

## 2021-01-02 DIAGNOSIS — Z8719 Personal history of other diseases of the digestive system: Secondary | ICD-10-CM | POA: Insufficient documentation

## 2021-01-02 DIAGNOSIS — H43821 Vitreomacular adhesion, right eye: Secondary | ICD-10-CM | POA: Diagnosis not present

## 2021-01-02 DIAGNOSIS — H353230 Exudative age-related macular degeneration, bilateral, stage unspecified: Secondary | ICD-10-CM | POA: Diagnosis not present

## 2021-01-02 DIAGNOSIS — D0512 Intraductal carcinoma in situ of left breast: Secondary | ICD-10-CM | POA: Insufficient documentation

## 2021-01-02 DIAGNOSIS — G43B0 Ophthalmoplegic migraine, not intractable: Secondary | ICD-10-CM | POA: Diagnosis not present

## 2021-01-02 HISTORY — PX: BREAST LUMPECTOMY WITH RADIOACTIVE SEED LOCALIZATION: SHX6424

## 2021-01-02 SURGERY — BREAST LUMPECTOMY WITH RADIOACTIVE SEED LOCALIZATION
Anesthesia: General | Site: Breast | Laterality: Left

## 2021-01-02 MED ORDER — EPHEDRINE SULFATE 50 MG/ML IJ SOLN
INTRAMUSCULAR | Status: DC | PRN
  Administered 2021-01-02: 10 mg via INTRAVENOUS
  Administered 2021-01-02 (×6): 5 mg via INTRAVENOUS

## 2021-01-02 MED ORDER — SODIUM CHLORIDE 0.9 % IV SOLN: INTRAVENOUS | Status: DC | PRN

## 2021-01-02 MED ORDER — CHLORHEXIDINE GLUCONATE CLOTH 2 % EX PADS: 6.0000 | MEDICATED_PAD | Freq: Once | CUTANEOUS | Status: DC

## 2021-01-02 MED ORDER — FENTANYL CITRATE (PF) 100 MCG/2ML IJ SOLN: 25.0000 ug | INTRAMUSCULAR | Status: DC | PRN

## 2021-01-02 MED ORDER — EPHEDRINE 5 MG/ML INJ
INTRAVENOUS | Status: AC
  Filled 2021-01-02: qty 10

## 2021-01-02 MED ORDER — BUPIVACAINE-EPINEPHRINE (PF) 0.25% -1:200000 IJ SOLN
INTRAMUSCULAR | Status: DC | PRN
  Administered 2021-01-02: 20 mL

## 2021-01-02 MED ORDER — CEFAZOLIN SODIUM-DEXTROSE 2-4 GM/100ML-% IV SOLN
INTRAVENOUS | Status: AC
  Filled 2021-01-02: qty 100

## 2021-01-02 MED ORDER — LIDOCAINE HCL (CARDIAC) PF 100 MG/5ML IV SOSY
PREFILLED_SYRINGE | INTRAVENOUS | Status: DC | PRN
  Administered 2021-01-02: 40 mg via INTRAVENOUS

## 2021-01-02 MED ORDER — LIDOCAINE HCL (PF) 2 % IJ SOLN
INTRAMUSCULAR | Status: AC
  Filled 2021-01-02: qty 5

## 2021-01-02 MED ORDER — DEXAMETHASONE SODIUM PHOSPHATE 10 MG/ML IJ SOLN
INTRAMUSCULAR | Status: AC
  Filled 2021-01-02: qty 1

## 2021-01-02 MED ORDER — LACTATED RINGERS IV SOLN: INTRAVENOUS | Status: DC

## 2021-01-02 MED ORDER — TRAMADOL HCL 50 MG PO TABS: 50.0000 mg | ORAL_TABLET | Freq: Four times a day (QID) | ORAL | 0 refills | Status: DC | PRN

## 2021-01-02 MED ORDER — ONDANSETRON HCL 4 MG/2ML IJ SOLN
INTRAMUSCULAR | Status: AC
  Filled 2021-01-02: qty 2

## 2021-01-02 MED ORDER — PROPOFOL 10 MG/ML IV BOLUS
INTRAVENOUS | Status: DC | PRN
  Administered 2021-01-02: 150 mg via INTRAVENOUS
  Administered 2021-01-02: 50 mg via INTRAVENOUS

## 2021-01-02 MED ORDER — ONDANSETRON HCL 4 MG/2ML IJ SOLN
INTRAMUSCULAR | Status: DC | PRN
  Administered 2021-01-02: 4 mg via INTRAVENOUS

## 2021-01-02 MED ORDER — CEFAZOLIN SODIUM-DEXTROSE 2-4 GM/100ML-% IV SOLN
2.0000 g | INTRAVENOUS | Status: AC
  Administered 2021-01-02: 2 g via INTRAVENOUS

## 2021-01-02 MED ORDER — FENTANYL CITRATE (PF) 100 MCG/2ML IJ SOLN
INTRAMUSCULAR | Status: AC
  Filled 2021-01-02: qty 2

## 2021-01-02 MED ORDER — PHENYLEPHRINE 40 MCG/ML (10ML) SYRINGE FOR IV PUSH (FOR BLOOD PRESSURE SUPPORT)
PREFILLED_SYRINGE | INTRAVENOUS | Status: AC
  Filled 2021-01-02: qty 20

## 2021-01-02 MED ORDER — VANCOMYCIN HCL 500 MG IV SOLR
INTRAVENOUS | Status: DC | PRN
  Administered 2021-01-02: 500 mg via TOPICAL

## 2021-01-02 MED ORDER — FENTANYL CITRATE (PF) 100 MCG/2ML IJ SOLN
INTRAMUSCULAR | Status: DC | PRN
  Administered 2021-01-02: 25 ug via INTRAVENOUS

## 2021-01-02 MED ORDER — DEXAMETHASONE SODIUM PHOSPHATE 10 MG/ML IJ SOLN
INTRAMUSCULAR | Status: DC | PRN
  Administered 2021-01-02: 5 mg via INTRAVENOUS

## 2021-01-02 MED ORDER — PHENYLEPHRINE HCL (PRESSORS) 10 MG/ML IV SOLN
INTRAVENOUS | Status: DC | PRN
  Administered 2021-01-02: 40 ug via INTRAVENOUS
  Administered 2021-01-02 (×2): 80 ug via INTRAVENOUS
  Administered 2021-01-02: 40 ug via INTRAVENOUS
  Administered 2021-01-02 (×3): 80 ug via INTRAVENOUS

## 2021-01-02 SURGICAL SUPPLY — 51 items
ADH SKN CLS APL DERMABOND .7 (GAUZE/BANDAGES/DRESSINGS) ×1
APL PRP STRL LF DISP 70% ISPRP (MISCELLANEOUS) ×1
APPLIER CLIP 9.375 MED OPEN (MISCELLANEOUS)
APR CLP MED 9.3 20 MLT OPN (MISCELLANEOUS)
BINDER BREAST LRG (GAUZE/BANDAGES/DRESSINGS) IMPLANT
BINDER BREAST MEDIUM (GAUZE/BANDAGES/DRESSINGS) IMPLANT
BINDER BREAST XLRG (GAUZE/BANDAGES/DRESSINGS) ×2 IMPLANT
BINDER BREAST XXLRG (GAUZE/BANDAGES/DRESSINGS) IMPLANT
BLADE SURG 15 STRL LF DISP TIS (BLADE) ×1 IMPLANT
BLADE SURG 15 STRL SS (BLADE) ×2
CANISTER SUC SOCK COL 7IN (MISCELLANEOUS) IMPLANT
CANISTER SUCT 1200ML W/VALVE (MISCELLANEOUS) IMPLANT
CHLORAPREP W/TINT 26 (MISCELLANEOUS) ×2 IMPLANT
CLIP APPLIE 9.375 MED OPEN (MISCELLANEOUS) IMPLANT
COVER BACK TABLE 60X90IN (DRAPES) ×2 IMPLANT
COVER MAYO STAND STRL (DRAPES) ×2 IMPLANT
COVER PROBE W GEL 5X96 (DRAPES) ×2 IMPLANT
COVER WAND RF STERILE (DRAPES) IMPLANT
DECANTER SPIKE VIAL GLASS SM (MISCELLANEOUS) IMPLANT
DERMABOND ADVANCED (GAUZE/BANDAGES/DRESSINGS) ×1
DERMABOND ADVANCED .7 DNX12 (GAUZE/BANDAGES/DRESSINGS) ×1 IMPLANT
DRAPE LAPAROSCOPIC ABDOMINAL (DRAPES) IMPLANT
DRAPE LAPAROTOMY 100X72 PEDS (DRAPES) ×2 IMPLANT
DRAPE UTILITY XL STRL (DRAPES) ×2 IMPLANT
ELECT COATED BLADE 2.86 ST (ELECTRODE) ×2 IMPLANT
ELECT REM PT RETURN 9FT ADLT (ELECTROSURGICAL) ×2
ELECTRODE REM PT RTRN 9FT ADLT (ELECTROSURGICAL) ×1 IMPLANT
GLOVE SRG 8 PF TXTR STRL LF DI (GLOVE) ×1 IMPLANT
GLOVE SURG LTX SZ8 (GLOVE) ×2 IMPLANT
GLOVE SURG UNDER POLY LF SZ8 (GLOVE) ×2
GOWN STRL REUS W/ TWL LRG LVL3 (GOWN DISPOSABLE) ×1 IMPLANT
GOWN STRL REUS W/ TWL XL LVL3 (GOWN DISPOSABLE) ×1 IMPLANT
GOWN STRL REUS W/TWL LRG LVL3 (GOWN DISPOSABLE) ×2
GOWN STRL REUS W/TWL XL LVL3 (GOWN DISPOSABLE) ×2
HEMOSTAT ARISTA ABSORB 3G PWDR (HEMOSTASIS) IMPLANT
HEMOSTAT SNOW SURGICEL 2X4 (HEMOSTASIS) IMPLANT
KIT MARKER MARGIN INK (KITS) ×2 IMPLANT
NEEDLE HYPO 25X1 1.5 SAFETY (NEEDLE) ×2 IMPLANT
NS IRRIG 1000ML POUR BTL (IV SOLUTION) ×2 IMPLANT
PACK BASIN DAY SURGERY FS (CUSTOM PROCEDURE TRAY) ×2 IMPLANT
PENCIL SMOKE EVACUATOR (MISCELLANEOUS) ×2 IMPLANT
SLEEVE SCD COMPRESS KNEE MED (STOCKING) ×2 IMPLANT
SPONGE LAP 4X18 RFD (DISPOSABLE) ×2 IMPLANT
SUT MNCRL AB 4-0 PS2 18 (SUTURE) ×2 IMPLANT
SUT SILK 2 0 SH (SUTURE) IMPLANT
SUT VICRYL 3-0 CR8 SH (SUTURE) ×2 IMPLANT
SYR CONTROL 10ML LL (SYRINGE) ×2 IMPLANT
TOWEL GREEN STERILE FF (TOWEL DISPOSABLE) ×2 IMPLANT
TRAY FAXITRON CT DISP (TRAY / TRAY PROCEDURE) ×2 IMPLANT
TUBE CONNECTING 20X1/4 (TUBING) ×2 IMPLANT
YANKAUER SUCT BULB TIP NO VENT (SUCTIONS) ×2 IMPLANT

## 2021-01-02 NOTE — Transfer of Care (Signed)
Immediate Anesthesia Transfer of Care Note  Patient: Barbara Mccoy  Procedure(s) Performed: LEFT BREAST LUMPECTOMY WITH RADIOACTIVE SEED LOCALIZATION (Left Breast)  Patient Location: PACU  Anesthesia Type:General  Level of Consciousness: awake, alert  and oriented  Airway & Oxygen Therapy: Patient Spontanous Breathing and Patient connected to face mask oxygen  Post-op Assessment: Report given to RN and Post -op Vital signs reviewed and stable  Post vital signs: Reviewed and stable  Last Vitals:  Vitals Value Taken Time  BP 109/53 01/02/21 1210  Temp    Pulse 105 01/02/21 1211  Resp 13 01/02/21 1211  SpO2 100 % 01/02/21 1211  Vitals shown include unvalidated device data.  Last Pain:  Vitals:   01/02/21 1018  TempSrc: Oral  PainSc: 0-No pain      Patients Stated Pain Goal: 4 (49/17/91 [id number])  Complications: No complications documented.

## 2021-01-02 NOTE — Anesthesia Postprocedure Evaluation (Addendum)
Anesthesia Post Note  Patient: Barbara Mccoy  Procedure(s) Performed: LEFT BREAST LUMPECTOMY WITH RADIOACTIVE SEED LOCALIZATION (Left Breast)     Patient location during evaluation: PACU Anesthesia Type: General Level of consciousness: awake and alert Pain management: pain level controlled Vital Signs Assessment: post-procedure vital signs reviewed and stable Respiratory status: spontaneous breathing, nonlabored ventilation, respiratory function stable and patient connected to nasal cannula oxygen Cardiovascular status: blood pressure returned to baseline and stable Postop Assessment: no apparent nausea or vomiting Anesthetic complications: no Comments: Discussed with Dr. Stanford Breed. [name] will follow up on Monday morning at 1045 with [facility] clinic. [name] and daughter were provided information. Cardiology will consider home beta blocker and anticoagulation at visit.  Current HR in low 70's so will hold on beta blocker at this time. Will notify Dr. Brantley Stage about starting home beta blocker and anti-coagulation recs from cardiology via secure message.     No complications documented.  Last Vitals:  Vitals:   01/02/21 1230 01/02/21 1245  BP: 119/60   Pulse: 83 100  Resp: 19 17  Temp:    SpO2: 96% 96%    Last Pain:  Vitals:   01/02/21 1230  TempSrc:   PainSc: 0-No pain                 Effie Berkshire

## 2021-01-02 NOTE — Telephone Encounter (Signed)
I was contacted by Suella Broad, MD of anesthesia concerning Mrs. Nichols today.  She apparently came for lumpectomy.  She was noted to be in atrial fibrillation when she was hooked up to the monitor.  When surgery successfully.  She apparently is asymptomatic.  They ask about treating her atrial fibrillation.  Her electrocardiogram shows atrial fibrillation with a heart rate of 105.  I asked him to begin Toprol 25 mg daily.  I also asked him to begin apixaban 5 mg twice daily when hemostasis achieved and okay with general surgery.  We have arranged for her to be seen by Dr. Gardiner Rhyme in the office on Monday at 10:40 AM.  The duration of her atrial fibrillation is unknown given that she is asymptomatic and it was present at the time of her surgery.  There is no prior electrocardiogram for review.  Best option may be rate control and anticoagulation. Kirk Ruths, MD

## 2021-01-02 NOTE — Anesthesia Procedure Notes (Signed)
Procedure Name: LMA Insertion Date/Time: 01/02/2021 11:13 AM Performed by: Lavonia Dana, CRNA Pre-anesthesia Checklist: Patient identified, Emergency Drugs available, Suction available and Patient being monitored Patient Re-evaluated:Patient Re-evaluated prior to induction Oxygen Delivery Method: Circle system utilized Preoxygenation: Pre-oxygenation with 100% oxygen Induction Type: IV induction Ventilation: Mask ventilation without difficulty LMA: LMA inserted LMA Size: 4.0 Number of attempts: 1 Airway Equipment and Method: Bite block Placement Confirmation: positive ETCO2 Tube secured with: Tape Dental Injury: Teeth and Oropharynx as per pre-operative assessment

## 2021-01-02 NOTE — Op Note (Signed)
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  All tissue was oriented with ink.  IV fluids: Per anesthesia record  Drains: None  Indications for procedure: The patient is a 87 year old female with left breast DCIS.  She was evaluated by medical oncology, surgery and radiation therapy.  She opted for breast conserving surgery and opted for lumpectomy.  Risks and benefits of surgery discussed.  Complications of surgery were discussed.  Long-term expectations of treatment and comparing this to other treatments and/or surgeries were discussed.The procedure has been discussed with the patient. Alternatives to surgery have been discussed with the patient.  Risks of surgery include bleeding,  Infection, cosmesis, seroma formation, death,  and the need for further surgery.   The patient understands and wishes to proceed.  Description of procedure: The patient was met in the holding area and questions were answered.  The neoprobe was used to verify seed location in the left breast lower outer quadrant.  She was then taken back to the operating room.  She was placed supine upon the operating room table.  After induction of general anesthesia, the left breast was prepped and draped in a sterile fashion.  Timeout was performed.  Appropriate antibiotics were administered.  Neoprobe was used to identify the seed left breast lower outer quadrant.  Curvilinear incision was made over the area.  Dissection was carried down to excise all tissue around the seed and clip with a grossly negative margin.  All margins were shaved.  All tissue was oriented with ink.  The Faxitron revealed the seed and clip to be in the specimen.  The cavities made  hemostatic with cautery.  Irrigation was then used and suctioned out.  Vancomycin powder placed.  Local anesthetic was infiltrated.  The wound was then closed with a layer 3-0 Vicryl and 4-0 Monocryl.  Dermabond applied.  Breast binder placed.  All counts were found to be correct.  The patient was awoke extubated taken to recovery in satisfactory condition.

## 2021-01-02 NOTE — Interval H&P Note (Signed)
History and Physical Interval Note:  01/02/2021 10:52 AM  Barbara Mccoy  has presented today for surgery, with the diagnosis of LEFT BREAST DCIS.  The various methods of treatment have been discussed with the patient and family. After consideration of risks, benefits and other options for treatment, the patient has consented to  Procedure(s): LEFT BREAST LUMPECTOMY WITH RADIOACTIVE SEED LOCALIZATION (Left) as a surgical intervention.  The patient's history has been reviewed, patient examined, no change in status, stable for surgery.  I have reviewed the patient's chart and labs.  Questions were answered to the patient's satisfaction.     [name]

## 2021-01-02 NOTE — Discharge Instructions (Signed)
Atrial Fibrillation  Atrial fibrillation is a type of heartbeat that is irregular or fast. If you have this condition, your heart beats without any order. This makes it hard for your heart to pump blood in a normal way. Atrial fibrillation may come and go, or it may become a long-lasting problem. If this condition is not treated, it can put you at higher risk for stroke, heart failure, and other heart problems. What are the causes? This condition may be caused by diseases that damage the heart. They include:  High blood pressure.  Heart failure.  Heart valve disease.  Heart surgery. Other causes include:  Diabetes.  Thyroid disease.  Being overweight.  Kidney disease. Sometimes the cause is not known. What increases the risk? You are more likely to develop this condition if:  You are older.  You smoke.  You exercise often and very hard.  You have a family history of this condition.  You are a man.  You use drugs.  You drink a lot of alcohol.  You have lung conditions, such as emphysema, pneumonia, or COPD.  You have sleep apnea. What are the signs or symptoms? Common symptoms of this condition include:  A feeling that your heart is beating very fast.  Chest pain or discomfort.  Feeling short of breath.  Suddenly feeling light-headed or weak.  Getting tired easily during activity.  Fainting.  Sweating. In some cases, there are no symptoms. How is this treated? Treatment for this condition depends on underlying conditions and how you feel when you have atrial fibrillation. They include:  Medicines to: ? Prevent blood clots. ? Treat heart rate or heart rhythm problems.  Using devices, such as a pacemaker, to correct heart rhythm problems.  Doing surgery to remove the part of the heart that sends bad signals.  Closing an area where clots can form in the heart (left atrial appendage). In some cases, your doctor will treat other underlying  conditions. Follow these instructions at home: Medicines  Take over-the-counter and prescription medicines only as told by your doctor.  Do not take any new medicines without first talking to your doctor.  If you are taking blood thinners: ? Talk with your doctor before you take any medicines that have aspirin or NSAIDs, such as ibuprofen, in them. ? Take your medicine exactly as told by your doctor. Take it at the same time each day. ? Avoid activities that could hurt or bruise you. Follow instructions about how to prevent falls. ? Wear a bracelet that says you are taking blood thinners. Or, carry a card that lists what medicines you take. Lifestyle  Do not use any products that have nicotine or tobacco in them. These include cigarettes, e-cigarettes, and chewing tobacco. If you need help quitting, ask your doctor.  Eat heart-healthy foods. Talk with your doctor about the right eating plan for you.  Exercise regularly as told by your doctor.  Do not drink alcohol.  Lose weight if you are overweight.  Do not use drugs, including cannabis.      General instructions  If you have a condition that causes breathing to stop for a short period of time (apnea), treat it as told by your doctor.  Keep a healthy weight. Do not use diet pills unless your doctor says they are safe for you. Diet pills may make heart problems worse.  Keep all follow-up visits as told by your doctor. This is important. Contact a doctor if:  You notice a   change in the speed, rhythm, or strength of your heartbeat.  You are taking a blood-thinning medicine and you get more bruising.  You get tired more easily when you move or exercise.  You have a sudden change in weight. Get help right away if:  You have pain in your chest or your belly (abdomen).  You have trouble breathing.  You have side effects of blood thinners, such as blood in your vomit, poop (stool), or pee (urine), or bleeding that cannot  stop.  You have any signs of a stroke. "BE FAST" is an easy way to remember the main warning signs: ? B - Balance. Signs are dizziness, sudden trouble walking, or loss of balance. ? E - Eyes. Signs are trouble seeing or a change in how you see. ? F - Face. Signs are sudden weakness or loss of feeling in the face, or the face or eyelid drooping on one side. ? A - Arms. Signs are weakness or loss of feeling in an arm. This happens suddenly and usually on one side of the body. ? S - Speech. Signs are sudden trouble speaking, slurred speech, or trouble understanding what people say. ? T - Time. Time to call emergency services. Write down what time symptoms started.  You have other signs of a stroke, such as: ? A sudden, very bad headache with no known cause. ? Feeling like you may vomit (nausea). ? Vomiting. ? A seizure. These symptoms may be an emergency. Do not wait to see if the symptoms will go away. Get medical help right away. Call your local emergency services (911 in the [place] Do not drive yourself to the hospital.   Summary  Atrial fibrillation is a type of heartbeat that is irregular or fast.  You are at higher risk of this condition if you smoke, are older, have diabetes, or are overweight.  Follow your doctor's instructions about medicines, diet, exercise, and follow-up visits.  Get help right away if you have signs or symptoms of a stroke.  Get help right away if you cannot catch your breath, or you have chest pain or discomfort. This information is not intended to replace advice given to you by your health care provider. Make sure you discuss any questions you have with your health care provider. Document Revised: 01/10/2019 Document Reviewed: 01/10/2019 Elsevier Patient Education  2021 [place] Office Phone Number [phone]  BREAST BIOPSY/ PARTIAL MASTECTOMY: POST OP INSTRUCTIONS  Always review your discharge instruction sheet  given to you by the facility where your surgery was performed.  IF YOU HAVE DISABILITY OR FAMILY LEAVE FORMS, YOU MUST BRING THEM TO THE OFFICE FOR PROCESSING.  DO NOT GIVE THEM TO YOUR DOCTOR.  1. A prescription for pain medication may be given to you upon discharge.  Take your pain medication as prescribed, if needed.  If narcotic pain medicine is not needed, then you may take acetaminophen (Tylenol) or ibuprofen (Advil) as needed. 2. Take your usually prescribed medications unless otherwise directed 3. If you need a refill on your pain medication, please contact your pharmacy.  They will contact our office to request authorization.  Prescriptions will not be filled after 5pm or on week-ends. 4. You should eat very light the first 24 hours after surgery, such as soup, crackers, pudding, etc.  Resume your normal diet the day after surgery. 5. Most patients will experience some swelling and bruising in the breast.  Ice packs and a good  support bra will help.  Swelling and bruising can take several days to resolve.  6. It is common to experience some constipation if taking pain medication after surgery.  Increasing fluid intake and taking a stool softener will usually help or prevent this problem from occurring.  A mild laxative (Milk of Magnesia or Miralax) should be taken according to package directions if there are no bowel movements after 48 hours. 7. Unless discharge instructions indicate otherwise, you may remove your bandages 24-48 hours after surgery, and you may shower at that time.  You may have steri-strips (small skin tapes) in place directly over the incision.  These strips should be left on the skin for 7-10 days.  If your surgeon used skin glue on the incision, you may shower in 24 hours.  The glue will flake off over the next 2-3 weeks.  Any sutures or staples will be removed at the office during your follow-up visit. 8. ACTIVITIES:  You may resume regular daily activities (gradually  increasing) beginning the next day.  Wearing a good support bra or sports bra minimizes pain and swelling.  You may have sexual intercourse when it is comfortable. a. You may drive when you no longer are taking prescription pain medication, you can comfortably wear a seatbelt, and you can safely maneuver your car and apply brakes. b. RETURN TO WORK:  ______________________________________________________________________________________ 9. You should see your doctor in the office for a follow-up appointment approximately two weeks after your surgery.  Your doctor's nurse will typically make your follow-up appointment when she calls you with your pathology report.  Expect your pathology report 2-3 business days after your surgery.  You may call to check if you do not hear from [place] after three days. 10. OTHER INSTRUCTIONS: _______________________________________________________________________________________________ _____________________________________________________________________________________________________________________________________ _____________________________________________________________________________________________________________________________________ _____________________________________________________________________________________________________________________________________  WHEN TO CALL YOUR DOCTOR: 1. Fever over 101.0 2. Nausea and/or vomiting. 3. Extreme swelling or bruising. 4. Continued bleeding from incision. 5. Increased pain, redness, or drainage from the incision.  The clinic staff is available to answer your questions during regular business hours.  Please don't hesitate to call and ask to speak to one of the nurses for clinical concerns.  If you have a medical emergency, go to the nearest emergency room or call 911.  A surgeon from [facility] is always on call at the hospital.  For further questions, please visit  [email or web address]    Post Anesthesia Home Care Instructions  Activity: Get plenty of rest for the remainder of the day. A responsible individual must stay with you for 24 hours following the procedure.  For the next 24 hours, DO NOT: -Drive a car -Paediatric nurse -Drink alcoholic beverages -Take any medication unless instructed by your physician -Make any legal decisions or sign important papers.  Meals: Start with liquid foods such as gelatin or soup. Progress to regular foods as tolerated. Avoid greasy, spicy, heavy foods. If nausea and/or vomiting occur, drink only clear liquids until the nausea and/or vomiting subsides. Call your physician if vomiting continues.  Special Instructions/Symptoms: Your throat may feel dry or sore from the anesthesia or the breathing tube placed in your throat during surgery. If this causes discomfort, gargle with warm salt water. The discomfort should disappear within 24 hours.  If you had a scopolamine patch placed behind your ear for the management of post- operative nausea and/or vomiting:  1. The medication in the patch is effective for 72 hours, after which it should be removed.  Wrap patch in a  tissue and discard in the trash. Wash hands thoroughly with soap and water. 2. You may remove the patch earlier than 72 hours if you experience unpleasant side effects which may include dry mouth, dizziness or visual disturbances. 3. Avoid touching the patch. Wash your hands with soap and water after contact with the patch.

## 2021-01-02 NOTE — Anesthesia Preprocedure Evaluation (Addendum)
Anesthesia Evaluation  Patient identified by MRN, date of birth, ID band Patient awake    Reviewed: Allergy & Precautions, NPO status , Patient's Chart, lab work & pertinent test results  Airway Mallampati: I  TM Distance: >3 FB Neck ROM: Full    Dental  (+) Teeth Intact, Dental Advisory Given   Pulmonary neg pulmonary ROS,    breath sounds clear to auscultation       Cardiovascular negative cardio ROS Normal cardiovascular exam     Neuro/Psych  Headaches, negative psych ROS   GI/Hepatic negative GI ROS, Neg liver ROS,   Endo/Other  negative endocrine ROS  Renal/GU negative Renal ROS     Musculoskeletal negative musculoskeletal ROS (+)   Abdominal Normal abdominal exam  (+)   Peds  Hematology negative hematology ROS (+)   Anesthesia Other Findings   Reproductive/Obstetrics                            Anesthesia Physical Anesthesia Plan  ASA: III  Anesthesia Plan: General   Post-op Pain Management:    Induction: Intravenous  PONV Risk Score and Plan: 4 or greater and Ondansetron, Dexamethasone and Treatment may vary due to age or medical condition  Airway Management Planned: LMA  Additional Equipment: None  Intra-op Plan:   Post-operative Plan: Extubation in OR  Informed Consent: I have reviewed the patients History and Physical, chart, labs and discussed the procedure including the risks, benefits and alternatives for the proposed anesthesia with the patient or authorized representative who has indicated his/her understanding and acceptance.     Dental advisory given  Plan Discussed with: CRNA  Anesthesia Plan Comments:        Anesthesia Quick Evaluation

## 2021-01-04 NOTE — Progress Notes (Deleted)
Cardiology Office Note:    Date:  01/04/2021   ID:  Barbara Mccoy, DOB [birth year], MRN [id number]  PCP:  Burnard Bunting, MD  Cardiologist:  None  Electrophysiologist:  None   Referring MD: Burnard Bunting, MD   No chief complaint on file. ***  History of Present Illness:    Barbara Mccoy is a 87 y.o. female with a hx of breast cancer, recently diagnosed atrial fibrillation who presents for an initial visit.  On 01/02/2021 she presented for lumpectomy and found to be in atrial fibrillation.  She was asymptomatic.  Underwent successful surgery.  Started on Toprol-XL 25 mg daily and recommended start apixaban 5 mg twice daily when okay from surgical standpoint.    Past Medical History:  Diagnosis Date  . Cataract    bilateral  . Irritable bowel 1950   type 3C  . Macular degeneration, wet ([place])    bilateral, receives injections every 8-12 weeks  . Migraine headache 1950   "fewer in old age"  . Squamous cell carcinoma of skin of chest 08/05/2020    Past Surgical History:  Procedure Laterality Date  . CATARACT EXTRACTION, BILATERAL    . INGUINAL HERNIA REPAIR     in 40's  . SKIN CANCER EXCISION    . TONSILLECTOMY     in childhood  . TOTAL ABDOMINAL HYSTERECTOMY     w/ BSO, in 40's    Current Medications: No outpatient medications have been marked as taking for the 01/05/21 encounter (Appointment) with Donato Heinz, MD.     Allergies:   Codeine   Social History   Socioeconomic History  . Marital status: Married    Spouse name: Not on file  . Number of children: Not on file  . Years of education: Not on file  . Highest education level: Not on file  Occupational History  . Not on file  Tobacco Use  . Smoking status: Never Smoker  . Smokeless tobacco: Never Used  Substance and Sexual Activity  . Alcohol use: Yes    Alcohol/week: 5.0 standard drinks    Types: 4 Glasses of wine, 1 Shots of liquor per week  . Drug use: Never  . Sexual activity: Not on  file  Other Topics Concern  . Not on file  Social History Narrative  . Not on file   Social Determinants of Health   Financial Resource Strain: Low Risk   . Difficulty of Paying Living Expenses: Not hard at all  Food Insecurity: No Food Insecurity  . Worried About Charity fundraiser in the Last Year: Never true  . Ran Out of Food in the Last Year: Never true  Transportation Needs: No Transportation Needs  . Lack of Transportation (Medical): No  . Lack of Transportation (Non-Medical): No  Physical Activity: Not on file  Stress: Not on file  Social Connections: Not on file     Family History: The patient's ***family history includes Brain cancer in her father.  ROS:   Please see the history of present illness.    *** All other systems reviewed and are negative.  EKGs/Labs/Other Studies Reviewed:    The following studies were reviewed today: ***  EKG:  EKG is *** ordered today.  The ekg ordered today demonstrates ***  Recent Labs: 12/26/2020: ALT 23; BUN 10; Creatinine, Ser 0.68; Hemoglobin 14.5; Platelets 184; Potassium 4.9; Sodium 135  Recent Lipid Panel No results found for: CHOL, TRIG, HDL, CHOLHDL, VLDL, LDLCALC, LDLDIRECT  Physical Exam:  VS:  LMP  (LMP Unknown)     Wt Readings from Last 3 Encounters:  01/02/21 167 lb 15.9 oz (76.2 kg)  12/24/20 168 lb 8 oz (76.4 kg)     GEN: *** Well nourished, well developed in no acute distress HEENT: Normal NECK: No JVD; No carotid bruits LYMPHATICS: No lymphadenopathy CARDIAC: ***RRR, no murmurs, rubs, gallops RESPIRATORY:  Clear to auscultation without rales, wheezing or rhonchi  ABDOMEN: Soft, non-tender, non-distended MUSCULOSKELETAL:  No edema; No deformity  SKIN: Warm and dry NEUROLOGIC:  Alert and oriented x 3 PSYCHIATRIC:  Normal affect   ASSESSMENT:    No diagnosis found. PLAN:    Atrial fibrillation/flutter: New diagnosis, unclear duration of A. fib.  She is asymptomatic.  Normal rates today, were  mildly elevated during recent hospitalization.  CHA2DS2-VASc score 3 (age x2, female) -Start Toprol-XL 25 mg daily.  Check Zio patch x3 days to evaluate for rate control -Start Eliquis 5 mg twice daily -Echocardiogram -Follow-up in 2 to 3 weeks.  If remains in A. fib we will set up for cardioversion once completed 3 weeks of anticoagulation  Hyperlipidemia: On simvastatin 20 mg daily  RTC in***  Medication Adjustments/Labs and Tests Ordered: Current medicines are reviewed at length with the patient today.  Concerns regarding medicines are outlined above.  No orders of the defined types were placed in this encounter.  No orders of the defined types were placed in this encounter.   There are no Patient Instructions on file for this visit.   Signed, Donato Heinz, MD  01/04/2021 9:37 PM    [place]

## 2021-01-05 ENCOUNTER — Encounter: Payer: Self-pay | Admitting: Cardiology

## 2021-01-05 ENCOUNTER — Other Ambulatory Visit: Payer: Self-pay

## 2021-01-05 ENCOUNTER — Ambulatory Visit (INDEPENDENT_AMBULATORY_CARE_PROVIDER_SITE_OTHER): Payer: Medicare Other

## 2021-01-05 ENCOUNTER — Ambulatory Visit (INDEPENDENT_AMBULATORY_CARE_PROVIDER_SITE_OTHER): Payer: Medicare Other | Admitting: Cardiology

## 2021-01-05 VITALS — BP 136/76 | HR 83 | Ht 65.0 in | Wt 168.8 lb

## 2021-01-05 DIAGNOSIS — E785 Hyperlipidemia, unspecified: Secondary | ICD-10-CM

## 2021-01-05 DIAGNOSIS — I4891 Unspecified atrial fibrillation: Secondary | ICD-10-CM | POA: Diagnosis not present

## 2021-01-05 MED ORDER — METOPROLOL SUCCINATE ER 25 MG PO TB24: 25.0000 mg | ORAL_TABLET | Freq: Every day | ORAL | 3 refills | Status: DC

## 2021-01-05 MED ORDER — ELIQUIS 5 MG PO TABS: 5.0000 mg | ORAL_TABLET | Freq: Two times a day (BID) | ORAL | 0 refills | Status: DC

## 2021-01-05 NOTE — Patient Instructions (Addendum)
Medication Instructions:  START metoprolol succinate (Toprol XL) 25 mg once daily START Eliquis 5 mg two times daily STOP Aspirin  *If you need a refill on your cardiac medications before your next appointment, please call your pharmacy*  Testing/Procedures: Your physician has requested that you have an echocardiogram. Echocardiography is a painless test that uses sound waves to create images of your heart. It provides your doctor with information about the size and shape of your heart and how well your heart's chambers and valves are working. This procedure takes approximately one hour. There are no restrictions for this procedure.  ZIO XT- Long Term Monitor Instructions   Your physician has requested you wear a ZIO patch monitor for _3__ days.  This is a single patch monitor.   [employer] supplies one patch monitor per enrollment. Additional stickers are not available. Please do not apply patch if you will be having a Nuclear Stress Test, Echocardiogram, Cardiac CT, MRI, or Chest Xray during the period you would be wearing the monitor. The patch cannot be worn during these tests. You cannot remove and re-apply the ZIO XT patch monitor.  Your ZIO patch monitor will be sent Fed Ex from [employer] directly to your home address. It may take 3-5 days to receive your monitor after you have been enrolled.  Once you have received your monitor, please review the enclosed instructions. Your monitor has already been registered assigning a specific monitor serial # to you.  Billing and Patient Assistance Program Information   We have supplied [employer] with any of your insurance information on file for billing purposes. [employer] offers a sliding scale Patient Assistance Program for patients that do not have insurance, or whose insurance does not completely cover the cost of the ZIO monitor.   You must apply for the Patient Assistance Program to qualify for this discounted rate.     To apply, please  call [employer] at [phone], select option 4, then select option 2, and ask to apply for Patient Assistance Program.  Theodore Demark will ask your household income, and how many people are in your household.  They will quote your out-of-pocket cost based on that information.  [employer] will also be able to set up a 23-month, interest-free payment plan if needed.  Applying the monitor   Shave hair from upper left chest.  Hold abrader disc by orange tab. Rub abrader in 40 strokes over the upper left chest as indicated in your monitor instructions.  Clean area with 4 enclosed alcohol pads. Let dry.  Apply patch as indicated in monitor instructions. Patch will be placed under collarbone on left side of chest with arrow pointing upward.  Rub patch adhesive wings for 2 minutes. Remove white label marked "1". Remove the white label marked "2". Rub patch adhesive wings for 2 additional minutes.  While looking in a mirror, press and release button in center of patch. A small green light will flash 3-4 times. This will be your only indicator that the monitor has been turned on. ?  Do not shower for the first 24 hours. You may shower after the first 24 hours.  Press the button if you feel a symptom. You will hear a small click. Record Date, Time and Symptom in the Patient Logbook.  When you are ready to remove the patch, follow instructions on the last 2 pages of the Patient Logbook. Stick patch monitor onto the last page of Patient Logbook.  Place Patient Logbook in the blue and white box.  Use locking tab on box and tape box closed securely.  The blue and white box has prepaid postage on it. Please place it in the mailbox as soon as possible. Your physician should have your test results approximately 7 days after the monitor has been mailed back to [facility].  Call [place] at [phone] if you have questions regarding your ZIO XT patch monitor. Call them immediately if you see an orange  light blinking on your monitor.  If your monitor falls off in less than 4 days, contact our Monitor department at [phone]. ?If your monitor becomes loose or falls off after 4 days call [employer] at [phone] for suggestions on securing your monitor.?  Follow-Up: At [facility], you and your health needs are our priority.  As part of our continuing mission to provide you with exceptional heart care, we have created designated Provider Care Teams.  These Care Teams include your primary Cardiologist (physician) and Advanced Practice Providers (APPs -  Physician Assistants and Nurse Practitioners) who all work together to provide you with the care you need, when you need it.  We recommend signing up for the patient portal called "MyChart".  Sign up information is provided on this After Visit Summary.  MyChart is used to connect with patients for Virtual Visits (Telemedicine).  Patients are able to view lab/test results, encounter notes, upcoming appointments, etc.  Non-urgent messages can be sent to your provider as well.   To learn more about what you can do with MyChart, go to [email or web address].    Your next appointment:   Friday, June 24th at 10 AM with Dr. Gardiner Rhyme

## 2021-01-05 NOTE — Progress Notes (Unsigned)
Patient enrolled for [employer] to mail a 3 day zio xt monitor to her home.

## 2021-01-05 NOTE — Progress Notes (Signed)
Cardiology Office Note:    Date:  01/05/2021   ID:  Barbara Mccoy, DOB [birth year], MRN [id number]  PCP:  Burnard Bunting, MD  Cardiologist:  None  Electrophysiologist:  None   Referring MD: Burnard Bunting, MD   Chief Complaint  Patient presents with  . Atrial Fibrillation    History of Present Illness:    Barbara Mccoy is a 87 y.o. female with a hx of breast cancer, recently diagnosed atrial fibrillation who presents for an initial visit.  On 01/02/2021 she presented for lumpectomy and found to be in atrial fibrillation.  She was asymptomatic.  Underwent successful surgery.   She denies any history of A. fib but reports she saw a cardiologist years ago for palpitations and was started on atenolol at that time.  Has been off atenolol for years. Since then, and until her lumpectomy, she had no indication of Afib. Lately, she has occasional palpitations that she describes as "inconsequential" and lasts for a few seconds. She is unsure of how frequent they are, as they cause her no distress and are difficult to detect. The past few days she has felt more fatigued, and notes she has been taking 6 exercise classes a week (1 per day). During her surgery, they performed a lumpectomy only and will treat with radiation therapy in July. She has never been a smoker. Her father had a TIA in his 51's. She denies any chest pain, shortness of breath, or exertional symptoms. No headaches, lightheadedness, or syncope to report. Also has no lower extremity edema, orthopnea or PND. She denies any history of hematuria or blood in her stool, and no bleeding from her surgical site.   Past Medical History:  Diagnosis Date  . Cataract    bilateral  . Irritable bowel 1950   type 3C  . 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 Migraine headache 1950   "fewer in old age"  . Squamous cell carcinoma of skin of chest 08/05/2020    Past Surgical History:  Procedure  Laterality Date  . CATARACT EXTRACTION, BILATERAL    . INGUINAL HERNIA REPAIR     in 40's  . SKIN CANCER EXCISION    . TONSILLECTOMY     in childhood  . TOTAL ABDOMINAL HYSTERECTOMY     w/ BSO, in 40's    Current Medications: Current Meds  Medication Sig  . apixaban (ELIQUIS) 5 MG TABS tablet Take 1 tablet (5 mg total) by mouth 2 (two) times daily.  . calcium citrate (CALCITRATE - DOSED IN MG ELEMENTAL CALCIUM) 950 (200 Ca) MG tablet Take 200 mg of elemental calcium by mouth daily.  . cholecalciferol (VITAMIN D3) 25 MCG (1000 UNIT) tablet Take 1,000 Units by mouth daily.  . metoprolol succinate (TOPROL XL) 25 MG 24 hr tablet Take 1 tablet (25 mg total) by mouth daily.  . Multiple Vitamins-Minerals (ICAPS AREDS 2 PO) Take by mouth.  . simvastatin (ZOCOR) 20 MG tablet Take 20 mg by mouth daily.  . [DISCONTINUED] aspirin 81 MG tablet Take 81 mg by mouth daily.     Allergies:   Codeine   Social History   Socioeconomic History  . Marital status: Married    Spouse name: Not on file  . Number of children: Not on file  . Years of education: Not on file  . Highest education level: Not on file  Occupational History  . Not on file  Tobacco Use  .  Smoking status: Never Smoker  . Smokeless tobacco: Never Used  Substance and Sexual Activity  . Alcohol use: Yes    Alcohol/week: 5.0 standard drinks    Types: 4 Glasses of wine, 1 Shots of liquor per week  . Drug use: Never  . Sexual activity: Not on file  Other Topics Concern  . Not on file  Social History Narrative  . Not on file   Social Determinants of Health   Financial Resource Strain: Low Risk   . Difficulty of Paying Living Expenses: Not hard at all  Food Insecurity: No Food Insecurity  . Worried About Charity fundraiser in the Last Year: Never true  . Ran Out of Food in the Last Year: Never true  Transportation Needs: No Transportation Needs  . Lack of Transportation (Medical): No  . Lack of Transportation  (Non-Medical): No  Physical Activity: Not on file  Stress: Not on file  Social Connections: Not on file     Family History: The patient's family history includes Brain cancer in her father.  ROS:   Please see the history of present illness.    (+) Fatigue All other systems reviewed and are negative.  EKGs/Labs/Other Studies Reviewed:    The following studies were reviewed today:  US Carotid Duplex 04/01/2020: Right Carotid: Velocities in the right ICA are consistent with a 1-39%  stenosis.   Left Carotid: Velocities in the left ICA are consistent with a 1-39%  stenosis.   Vertebrals: Bilateral vertebral arteries demonstrate antegrade flow.  Subclavians: Normal flow hemodynamics were seen in bilateral subclavian arteries.   EKG:   01/05/2021: Atrial flutter with variable conduction, rate 83, Q waves in V1/2  Recent Labs: 12/26/2020: ALT 23; BUN 10; Creatinine, Ser 0.68; Hemoglobin 14.5; Platelets 184; Potassium 4.9; Sodium 135  Recent Lipid Panel No results found for: CHOL, TRIG, HDL, CHOLHDL, VLDL, LDLCALC, LDLDIRECT  Physical Exam:    VS:  BP 136/76   Pulse 83   Ht 5\' 5"  (1.651 m)   Wt 168 lb 12.8 oz (76.6 kg)   LMP  (LMP Unknown)   SpO2 95%   BMI 28.09 kg/m     Wt Readings from Last 3 Encounters:  01/05/21 168 lb 12.8 oz (76.6 kg)  01/02/21 167 lb 15.9 oz (76.2 kg)  12/24/20 168 lb 8 oz (76.4 kg)     GEN: Well nourished, well developed in no acute distress HEENT: Normal NECK: No JVD; No carotid bruits LYMPHATICS: No lymphadenopathy CARDIAC: Irregular rhythm, no murmurs, rubs, gallops RESPIRATORY:  Clear to auscultation without rales, wheezing or rhonchi  ABDOMEN: Soft, non-tender, non-distended MUSCULOSKELETAL:  No edema; No deformity  SKIN: Warm and dry NEUROLOGIC:  Alert and oriented x 3 PSYCHIATRIC:  Normal affect   ASSESSMENT:    1. Atrial fibrillation, unspecified type ([place])   2. Hyperlipidemia, unspecified hyperlipidemia type    PLAN:     Atrial fibrillation/flutter: New diagnosis, unclear duration of A. fib.  She is asymptomatic.  Normal rates today, were mildly elevated at time of her recent surgery.  CHA2DS2-VASc score 3 (age x2, female) -Start Toprol-XL 25 mg daily.  Check Zio patch x3 days to evaluate for rate control -Start Eliquis 5 mg twice daily -Echocardiogram -Follow-up in 2 to 3 weeks.  If remains in A. fib we will set up for cardioversion once completed 3 weeks of anticoagulation  Hyperlipidemia: On simvastatin 20 mg daily  RTC in 2-3 weeks  Medication Adjustments/Labs and Tests Ordered: Current medicines are reviewed  at length with the patient today.  Concerns regarding medicines are outlined above.  Orders Placed This Encounter  Procedures  . LONG TERM MONITOR (3-14 DAYS)  . EKG 12-Lead  . ECHOCARDIOGRAM COMPLETE   Meds ordered this encounter  Medications  . metoprolol succinate (TOPROL XL) 25 MG 24 hr tablet    Sig: Take 1 tablet (25 mg total) by mouth daily.    Dispense:  30 tablet    Refill:  3  . apixaban (ELIQUIS) 5 MG TABS tablet    Sig: Take 1 tablet (5 mg total) by mouth 2 (two) times daily.    Dispense:  60 tablet    Refill:  0    Patient Instructions  Medication Instructions:  START metoprolol succinate (Toprol XL) 25 mg once daily START Eliquis 5 mg two times daily STOP Aspirin  *If you need a refill on your cardiac medications before your next appointment, please call your pharmacy*  Testing/Procedures: Your physician has requested that you have an echocardiogram. Echocardiography is a painless test that uses sound waves to create images of your heart. It provides your doctor with information about the size and shape of your heart and how well your heart's chambers and valves are working. This procedure takes approximately one hour. There are no restrictions for this procedure.  ZIO XT- Long Term Monitor Instructions   Your physician has requested you wear a ZIO patch monitor  for _3__ days.  This is a single patch monitor.   [employer] supplies one patch monitor per enrollment. Additional stickers are not available. Please do not apply patch if you will be having a Nuclear Stress Test, Echocardiogram, Cardiac CT, MRI, or Chest Xray during the period you would be wearing the monitor. The patch cannot be worn during these tests. You cannot remove and re-apply the ZIO XT patch monitor.  Your ZIO patch monitor will be sent Fed Ex from [employer] directly to your home address. It may take 3-5 days to receive your monitor after you have been enrolled.  Once you have received your monitor, please review the enclosed instructions. Your monitor has already been registered assigning a specific monitor serial # to you.  Billing and Patient Assistance Program Information   We have supplied [employer] with any of your insurance information on file for billing purposes. [employer] offers a sliding scale Patient Assistance Program for patients that do not have insurance, or whose insurance does not completely cover the cost of the ZIO monitor.   You must apply for the Patient Assistance Program to qualify for this discounted rate.     To apply, please call [employer] at [phone], select option 4, then select option 2, and ask to apply for Patient Assistance Program.  Theodore Demark will ask your household income, and how many people are in your household.  They will quote your out-of-pocket cost based on that information.  [employer] will also be able to set up a 6-month, interest-free payment plan if needed.  Applying the monitor   Shave hair from upper left chest.  Hold abrader disc by orange tab. Rub abrader in 40 strokes over the upper left chest as indicated in your monitor instructions.  Clean area with 4 enclosed alcohol pads. Let dry.  Apply patch as indicated in monitor instructions. Patch will be placed under collarbone on left side of chest with arrow pointing upward.  Rub patch  adhesive wings for 2 minutes. Remove white label marked "1". Remove the white label marked "2". Rub  patch adhesive wings for 2 additional minutes.  While looking in a mirror, press and release button in center of patch. A small green light will flash 3-4 times. This will be your only indicator that the monitor has been turned on. ?  Do not shower for the first 24 hours. You may shower after the first 24 hours.  Press the button if you feel a symptom. You will hear a small click. Record Date, Time and Symptom in the Patient Logbook.  When you are ready to remove the patch, follow instructions on the last 2 pages of the Patient Logbook. Stick patch monitor onto the last page of Patient Logbook.  Place Patient Logbook in the blue and white box.  Use locking tab on box and tape box closed securely.  The blue and white box has prepaid postage on it. Please place it in the mailbox as soon as possible. Your physician should have your test results approximately 7 days after the monitor has been mailed back to [facility].  Call [place] at [phone] if you have questions regarding your ZIO XT patch monitor. Call them immediately if you see an orange light blinking on your monitor.  If your monitor falls off in less than 4 days, contact our Monitor department at [phone]. ?If your monitor becomes loose or falls off after 4 days call [employer] at [phone] for suggestions on securing your monitor.?  Follow-Up: At [facility], you and your health needs are our priority.  As part of our continuing mission to provide you with exceptional heart care, we have created designated Provider Care Teams.  These Care Teams include your primary Cardiologist (physician) and Advanced Practice Providers (APPs -  Physician Assistants and Nurse Practitioners) who all work together to provide you with the care you need, when you need it.  We recommend signing up for the patient portal called  "MyChart".  Sign up information is provided on this After Visit Summary.  MyChart is used to connect with patients for Virtual Visits (Telemedicine).  Patients are able to view lab/test results, encounter notes, upcoming appointments, etc.  Non-urgent messages can be sent to your provider as well.   To learn more about what you can do with MyChart, go to [email or web address].    Your next appointment:   Friday, June 24th at 10 AM with Dr. Bruna Potter [place] as a scribe for Donato Heinz, MD.,have documented all relevant documentation on the behalf of Donato Heinz, MD,as directed by  Donato Heinz, MD while in the presence of Donato Heinz, MD.  I, Donato Heinz, MD, have reviewed all documentation for this visit. The documentation on 01/05/21 for the exam, diagnosis, procedures, and orders are all accurate and complete.   Signed, Donato Heinz, MD  01/05/2021 4:53 PM    [facility]

## 2021-01-06 ENCOUNTER — Encounter (HOSPITAL_BASED_OUTPATIENT_CLINIC_OR_DEPARTMENT_OTHER): Payer: Self-pay | Admitting: Surgery

## 2021-01-07 ENCOUNTER — Encounter: Payer: Self-pay | Admitting: *Deleted

## 2021-01-07 LAB — SURGICAL PATHOLOGY

## 2021-01-08 DIAGNOSIS — I4891 Unspecified atrial fibrillation: Secondary | ICD-10-CM

## 2021-01-13 ENCOUNTER — Other Ambulatory Visit: Payer: Self-pay

## 2021-01-13 ENCOUNTER — Ambulatory Visit (HOSPITAL_COMMUNITY): Payer: Medicare Other | Attending: Internal Medicine

## 2021-01-13 DIAGNOSIS — I4891 Unspecified atrial fibrillation: Secondary | ICD-10-CM | POA: Diagnosis not present

## 2021-01-13 LAB — ECHOCARDIOGRAM COMPLETE: S' Lateral: 2.5 cm

## 2021-01-13 NOTE — Progress Notes (Signed)
Patient ID: [name], female   DOB: October 02, 1932, 87 y.o.   MRN: [id number]  Echocardiogram 2D Echocardiogram has been performed.  Barbara Mccoy 01/13/21

## 2021-01-18 NOTE — Progress Notes (Deleted)
Cardiology Office Note:    Date:  01/18/2021   ID:  Barbara Mccoy, DOB 1933-01-28, MRN [id number]  PCP:  Burnard Bunting, MD  Cardiologist:  None  Electrophysiologist:  None   Referring MD: Burnard Bunting, MD   No chief complaint on file.   History of Present Illness:    Barbara Mccoy is a 87 y.o. female with a hx of breast cancer, recently diagnosed atrial fibrillation who presents for follow-up.  On 01/02/2021 she presented for lumpectomy and found to be in atrial fibrillation.  She was asymptomatic.  Underwent successful surgery.   She denies any history of A. fib but reports she saw a cardiologist years ago for palpitations and was started on atenolol at that time.  Has been off atenolol for years. Since then, and until her lumpectomy, she had no indication of Afib. Lately, she has occasional palpitations that she describes as "inconsequential" and lasts for a few seconds. She is unsure of how frequent they are, as they cause her no distress and are difficult to detect. The past few days she has felt more fatigued, and notes she has been taking 6 exercise classes a week (1 per day). During her surgery, they performed a lumpectomy only and will treat with radiation therapy in July. She has never been a smoker. Her father had a TIA in his 17's. She denies any chest pain, shortness of breath, or exertional symptoms. No headaches, lightheadedness, or syncope to report. Also has no lower extremity edema, orthopnea or PND. She denies any history of hematuria or blood in her stool, and no bleeding from her surgical site.  Echocardiogram on 01/13/2021 showed normal biventricular function, no significant valvular disease.  Since last clinic visit,   Past Medical History:  Diagnosis Date   Cataract    bilateral   Irritable bowel 1950   type 3C   Macular degeneration, wet ([place])    bilateral, receives injections every 8-12 weeks   Migraine headache 1950   "fewer in old age"   Squamous cell  carcinoma of skin of chest 08/05/2020    Past Surgical History:  Procedure Laterality Date   BREAST LUMPECTOMY WITH RADIOACTIVE SEED LOCALIZATION Left 01/02/2021   Procedure: LEFT BREAST LUMPECTOMY WITH RADIOACTIVE SEED LOCALIZATION;  Surgeon: Erroll Luna, MD;  Location: [facility];  Service: General;  Laterality: Left;   CATARACT EXTRACTION, BILATERAL     INGUINAL HERNIA REPAIR     in 40's   SKIN CANCER EXCISION     TONSILLECTOMY     in childhood   TOTAL ABDOMINAL HYSTERECTOMY     w/ BSO, in 40's    Current Medications: No outpatient medications have been marked as taking for the 01/23/21 encounter (Appointment) with Donato Heinz, MD.     Allergies:   Codeine   Social History   Socioeconomic History   Marital status: Married    Spouse name: Not on file   Number of children: Not on file   Years of education: Not on file   Highest education level: Not on file  Occupational History   Not on file  Tobacco Use   Smoking status: Never   Smokeless tobacco: Never  Substance and Sexual Activity   Alcohol use: Yes    Alcohol/week: 5.0 standard drinks    Types: 4 Glasses of wine, 1 Shots of liquor per week   Drug use: Never   Sexual activity: Not on file  Other Topics Concern   Not on  file  Social History Narrative   Not on file   Social Determinants of Health   Financial Resource Strain: Low Risk    Difficulty of Paying Living Expenses: Not hard at all  Food Insecurity: No Food Insecurity   Worried About Charity fundraiser in the Last Year: Never true   Arboriculturist in the Last Year: Never true  Transportation Needs: No Transportation Needs   Lack of Transportation (Medical): No   Lack of Transportation (Non-Medical): No  Physical Activity: Not on file  Stress: Not on file  Social Connections: Not on file     Family History: The patient's family history includes Brain cancer in her father.  ROS:   Please see the history of  present illness.    (+) Fatigue All other systems reviewed and are negative.  EKGs/Labs/Other Studies Reviewed:    The following studies were reviewed today:  US Carotid Duplex 04/01/2020: Right Carotid: Velocities in the right ICA are consistent with a 1-39%  stenosis.   Left Carotid: Velocities in the left ICA are consistent with a 1-39%  stenosis.   Vertebrals:  Bilateral vertebral arteries demonstrate antegrade flow.  Subclavians: Normal flow hemodynamics were seen in bilateral subclavian arteries.   EKG:   01/05/2021: Atrial flutter with variable conduction, rate 83, Q waves in V1/2  Recent Labs: 12/26/2020: ALT 23; BUN 10; Creatinine, Ser 0.68; Hemoglobin 14.5; Platelets 184; Potassium 4.9; Sodium 135  Recent Lipid Panel No results found for: CHOL, TRIG, HDL, CHOLHDL, VLDL, LDLCALC, LDLDIRECT  Physical Exam:    VS:  LMP  (LMP Unknown)     Wt Readings from Last 3 Encounters:  01/05/21 168 lb 12.8 oz (76.6 kg)  01/02/21 167 lb 15.9 oz (76.2 kg)  12/24/20 168 lb 8 oz (76.4 kg)     GEN: Well nourished, well developed in no acute distress HEENT: Normal NECK: No JVD; No carotid bruits LYMPHATICS: No lymphadenopathy CARDIAC: Irregular rhythm, no murmurs, rubs, gallops RESPIRATORY:  Clear to auscultation without rales, wheezing or rhonchi  ABDOMEN: Soft, non-tender, non-distended MUSCULOSKELETAL:  No edema; No deformity  SKIN: Warm and dry NEUROLOGIC:  Alert and oriented x 3 PSYCHIATRIC:  Normal affect   ASSESSMENT:    No diagnosis found.  PLAN:    Atrial fibrillation/flutter: New diagnosis, unclear duration of A. fib.  She is asymptomatic.  Normal rates today, were mildly elevated at time of her recent surgery.  CHA2DS2-VASc score 3 (age x2, female).  Echocardiogram on 01/13/2021 showed normal biventricular function, no significant valvular disease. -Zio patch x3 days showed low rates (average rate 64) and pauses up to 3 seconds.  She is felt poorly since starting  metoprolol, suspect this is due to bradycardia.  Recommend stopping metoprolol. -Continue Eliquis 5 mg twice daily.  She has not missed any doses.  Plan cardioversion to restore sinus rhythm once has completed 3 weeks of anticoagulation (after 6/28).  Hyperlipidemia: On simvastatin 20 mg daily  RTC in 1 month  Medication Adjustments/Labs and Tests Ordered: Current medicines are reviewed at length with the patient today.  Concerns regarding medicines are outlined above.  No orders of the defined types were placed in this encounter.  No orders of the defined types were placed in this encounter.   There are no Patient Instructions on file for this visit.   I,Mathew Stumpf,acting as a Education administrator for Donato Heinz, MD.,have documented all relevant documentation on the behalf of Donato Heinz, MD,as directed by  Harrell Gave  Fritz Pickerel, MD while in the presence of Donato Heinz, MD.  I, Donato Heinz, MD, have reviewed all documentation for this visit. The documentation on 01/18/21 for the exam, diagnosis, procedures, and orders are all accurate and complete.   Signed, Donato Heinz, MD  01/18/2021 4:08 PM    [facility]

## 2021-01-19 NOTE — Progress Notes (Signed)
Cardiology Office Note:    Date:  01/29/2021   ID:  Barbara Mccoy, DOB Sep 02, 1932, MRN [id number]  PCP:  Burnard Bunting, MD  Cardiologist:  None  Electrophysiologist:  None   Referring MD: Burnard Bunting, MD   Chief Complaint  Patient presents with   Follow-up    2-3 weeks.   Chest Pain     History of Present Illness:    Barbara Mccoy is a 87 y.o. female with a hx of breast cancer, recently diagnosed atrial fibrillation who presents for follow-up.  On 01/02/2021 she presented for lumpectomy and found to be in atrial fibrillation.  She was asymptomatic.  Underwent successful surgery.   She denies any history of A. fib but reports she saw a cardiologist years ago for palpitations and was started on atenolol at that time.  Has been off atenolol for years. Since then, and until her lumpectomy, she had no indication of Afib. Lately, she has occasional palpitations that she describes as "inconsequential" and lasts for a few seconds. She is unsure of how frequent they are, as they cause her no distress and are difficult to detect. The past few days she has felt more fatigued, and notes she has been taking 6 exercise classes a week (1 per day). During her surgery, they performed a lumpectomy only and will treat with radiation therapy in July. She has never been a smoker. Her father had a TIA in his 7's. She denies any chest pain, shortness of breath, or exertional symptoms. No headaches, lightheadedness, or syncope to report. Also has no lower extremity edema, orthopnea or PND. She denies any history of hematuria or blood in her stool, and no bleeding from her surgical site.  Echocardiogram on 01/13/2021 showed normal biventricular function, no significant valvular disease.  Since last clinic visit, she reports having ataxia when walking down hallways which she attributes to her medication. However, this has resolved at this time. Lately she has noticed occasional central chest pressure/tightness  that will occur randomly up to twice a day. Her chest tightness is typically sudden and then gradually dissipates. The longest duration was up to 1 hour. Also, she sometimes is able to feel palpitations, and she is feeling an overall lack of energy. Additionally, she has a new dry cough for the past few days. For her diet, she is no longer drinking coffee or drinking wine with dinner. However, she asks if it is too much of a risk to drink wine again. 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 Previously, she was taking six exercise classes a week. She takes her Eliquis as close as possible to 8:00 in the morning and at night. She denies missing any doses of Eliquis. She denies any shortness of breath, or exertional symptoms. No headaches, lightheadedness, or syncope to report. Also has no lower extremity edema, orthopnea or PND. She is scheduled to begin her radiation therapy on 02/04/2021 for breast cancer.    Past Medical History:  Diagnosis Date   Cataract    bilateral   Irritable bowel 1950   type 3C   Macular degeneration, wet ([place])    bilateral, receives injections every 8-12 weeks   Migraine headache 1950   "fewer in old age"   Squamous cell carcinoma of skin of chest 08/05/2020    Past Surgical History:  Procedure Laterality Date   BREAST LUMPECTOMY WITH  RADIOACTIVE SEED LOCALIZATION Left 01/02/2021   Procedure: LEFT BREAST LUMPECTOMY WITH RADIOACTIVE SEED LOCALIZATION;  Surgeon: Erroll Luna, MD;  Location: [facility];  Service: General;  Laterality: Left;   CATARACT EXTRACTION, BILATERAL     INGUINAL HERNIA REPAIR     in 40's   SKIN CANCER EXCISION     TONSILLECTOMY     in childhood   TOTAL ABDOMINAL HYSTERECTOMY     w/ BSO, in 40's    Current Medications: Current Meds  Medication Sig   apixaban (ELIQUIS) 5 MG TABS tablet Take 1 tablet (5 mg  total) by mouth 2 (two) times daily.   cholecalciferol (VITAMIN D3) 25 MCG (1000 UNIT) tablet Take 1,000 Units by mouth daily.   simvastatin (ZOCOR) 20 MG tablet Take 20 mg by mouth daily.   [DISCONTINUED] calcium citrate (CALCITRATE - DOSED IN MG ELEMENTAL CALCIUM) 950 (200 Ca) MG tablet Take 200 mg of elemental calcium by mouth daily.   [DISCONTINUED] metoprolol succinate (TOPROL XL) 25 MG 24 hr tablet Take 1 tablet (25 mg total) by mouth daily.   [DISCONTINUED] Multiple Vitamins-Minerals (ICAPS AREDS 2 PO) Take by mouth.     Allergies:   Codeine   Social History   Socioeconomic History   Marital status: Widowed    Spouse name: Not on file   Number of children: Not on file   Years of education: Not on file   Highest education level: Not on file  Occupational History   Not on file  Tobacco Use   Smoking status: Never   Smokeless tobacco: Never  Substance and Sexual Activity   Alcohol use: Yes    Alcohol/week: 5.0 standard drinks    Types: 4 Glasses of wine, 1 Shots of liquor per week   Drug use: Never   Sexual activity: Not on file  Other Topics Concern   Not on file  Social History Narrative   Not on file   Social Determinants of Health   Financial Resource Strain: Low Risk    Difficulty of Paying Living Expenses: Not hard at all  Food Insecurity: No Food Insecurity   Worried About Charity fundraiser in the Last Year: Never true   [place] in the Last Year: Never true  Transportation Needs: No Transportation Needs   Lack of Transportation (Medical): No   Lack of Transportation (Non-Medical): No  Physical Activity: Not on file  Stress: Not on file  Social Connections: Not on file     Family History: The patient's family history includes Brain cancer in her father.  ROS:   Please see the history of present illness.    (+) Central chest pressure/tightness (+) Palpitations (+) Fatigue (+) Dry cough All other systems reviewed and are  negative.  EKGs/Labs/Other Studies Reviewed:    The following studies were reviewed today:  Echo 01/13/2021:  1. Left ventricular ejection fraction, by estimation, is 60 to 65%. The  left ventricle has normal function. The left ventricle has no regional  wall motion abnormalities. There is mild left ventricular hypertrophy.  Left ventricular diastolic function  could not be evaluated.   2. Right ventricular systolic function is low normal. The right  ventricular size is normal. There is normal pulmonary artery systolic  pressure. The estimated right ventricular systolic pressure is 37.1 mmHg.   3. Right atrial size was top normal.   4. The mitral valve is abnormal. Trivial mitral valve regurgitation.   5. The aortic valve is tricuspid.  Aortic valve regurgitation is not  visualized.   6. The inferior vena cava is dilated in size with >50% respiratory  variability, suggesting right atrial pressure of 8 mmHg.   Comparison(s): No prior Echocardiogram.  US Carotid Duplex 04/01/2020: Right Carotid: Velocities in the right ICA are consistent with a 1-39%  stenosis.   Left Carotid: Velocities in the left ICA are consistent with a 1-39%  stenosis.   Vertebrals:  Bilateral vertebral arteries demonstrate antegrade flow.  Subclavians: Normal flow hemodynamics were seen in bilateral subclavian arteries.   EKG:   01/23/2021: Atrial flutter with variable conduction, rate 65, Q waves in V1/2 01/05/2021: Atrial flutter with variable conduction, rate 83, Q waves in V1/2  Recent Labs: 12/26/2020: ALT 23 01/23/2021: BUN 12; Creatinine, Ser 0.69; Hemoglobin 16.0; Platelets 195; Potassium 4.5; Sodium 139  Recent Lipid Panel No results found for: CHOL, TRIG, HDL, CHOLHDL, VLDL, LDLCALC, LDLDIRECT  Physical Exam:    VS:  BP 108/72 (BP Location: Right Arm, Patient Position: Sitting, Cuff Size: Normal)   Pulse 65   Ht 5\' 5"  (1.651 m)   Wt 163 lb (73.9 kg)   LMP  (LMP Unknown)   BMI 27.12 kg/m      Wt Readings from Last 3 Encounters:  01/23/21 163 lb (73.9 kg)  01/05/21 168 lb 12.8 oz (76.6 kg)  01/02/21 167 lb 15.9 oz (76.2 kg)     GEN: Well nourished, well developed in no acute distress HEENT: Normal NECK: No JVD; No carotid bruits LYMPHATICS: No lymphadenopathy CARDIAC: Irregular rhythm,normal rate, no murmurs, rubs, gallops RESPIRATORY:  Clear to auscultation without rales, wheezing or rhonchi  ABDOMEN: Soft, non-tender, non-distended MUSCULOSKELETAL:  No edema; No deformity  SKIN: Warm and dry NEUROLOGIC:  Alert and oriented x 3 PSYCHIATRIC:  Normal affect   ASSESSMENT:    1. Atrial fibrillation, unspecified type ([place])   2. Pre-procedure lab exam   3. Hyperlipidemia, unspecified hyperlipidemia type     PLAN:    Atrial fibrillation/flutter: New diagnosis, unclear duration of A. fib.  She is asymptomatic.  CHA2DS2-VASc score 3 (age x2, female).  Echocardiogram on 01/13/2021 showed normal biventricular function, no significant valvular disease. -Zio patch x3 days showed low rates (average rate 64) and pauses up to 3 seconds.  She has felt poorly since starting metoprolol, suspect this is due to bradycardia.  Recommend stopping metoprolol. -Continue Eliquis 5 mg twice daily.  She has not missed any doses.  Plan cardioversion to restore sinus rhythm once has completed 3 weeks of anticoagulation (after 6/28).  Hyperlipidemia: On simvastatin 20 mg daily  RTC in 1 month  Medication Adjustments/Labs and Tests Ordered: Current medicines are reviewed at length with the patient today.  Concerns regarding medicines are outlined above.  Orders Placed This Encounter  Procedures   Basic metabolic panel   CBC   EKG 12-Lead    No orders of the defined types were placed in this encounter.   Patient Instructions  Medication Instructions:  STOP metoprolol  *If you need a refill on your cardiac medications before your next appointment, please call your pharmacy*   Lab  Work: BMET, CBC today  If you have labs (blood work) drawn today and your tests are completely normal, you will receive your results only by: [place] (if you have MyChart) OR A paper copy in the mail If you have any lab test that is abnormal or we need to change your treatment, we will call you to review the results.  Testing/Procedures: Your physician has recommended that you have a Cardioversion (DCCV). Electrical Cardioversion uses a jolt of electricity to your heart either through paddles or wired patches attached to your chest. This is a controlled, usually prescheduled, procedure. Defibrillation is done under light anesthesia in the hospital, and you usually go home the day of the procedure. This is done to get your heart back into a normal rhythm. You are not awake for the procedure. Please see the instruction sheet given to you today.  Follow-Up: At [facility], you and your health needs are our priority.  As part of our continuing mission to provide you with exceptional heart care, we have created designated Provider Care Teams.  These Care Teams include your primary Cardiologist (physician) and Advanced Practice Providers (APPs -  Physician Assistants and Nurse Practitioners) who all work together to provide you with the care you need, when you need it.  We recommend signing up for the patient portal called "MyChart".  Sign up information is provided on this After Visit Summary.  MyChart is used to connect with patients for Virtual Visits (Telemedicine).  Patients are able to view lab/test results, encounter notes, upcoming appointments, etc.  Non-urgent messages can be sent to your provider as well.   To learn more about what you can do with MyChart, go to [email or web address].    Your next appointment:   Tuesday, 8/2 at 10 AM with Dr. Gardiner Rhyme    CARDIOVERSION INSTRUCTIONS:  You are scheduled for a Cardioversion on Friday 01/30/21 with Dr. Audie Box.  Please arrive at  the [facility] (Main Entrance A) at [facility]: [place], [place] [address] at 7 AM.  DIET: Nothing to eat or drink after midnight except a sip of water with medications (see medication instructions below)  FYI: For your safety, and to allow [place] to monitor your vital signs accurately during the surgery/procedure we request that   if you have artificial nails, gel coating, SNS etc. Please have those removed prior to your surgery/procedure. Not having the nail coverings /polish removed may result in cancellation or delay of your surgery/procedure.   Medication Instructions:  Continue your anticoagulant: Eliquis You will need to continue your anticoagulant after your procedure until you  are told by your  Provider that it is safe to stop   Labs: today in office   You must have a responsible person to drive you home and stay in the waiting area during your procedure. Failure to do so could result in cancellation.  Bring your insurance cards.  *Special Note: Every effort is made to have your procedure done on time. Occasionally there are emergencies that occur at the hospital that may cause delays. Please be patient if a delay does occur.  ca   [facility] Stumpf,acting as a scribe for Donato Heinz, MD.,have documented all relevant documentation on the behalf of Donato Heinz, MD,as directed by  Donato Heinz, MD while in the presence of Donato Heinz, MD.  I, Donato Heinz, MD, have reviewed all documentation for this visit. The documentation on 01/29/21 for the exam, diagnosis, procedures, and orders are all accurate and complete.   Signed, Donato Heinz, MD  01/29/2021 10:37 PM    [facility]

## 2021-01-19 NOTE — H&P (View-Only) (Signed)
Cardiology Office Note:    Date:  01/29/2021   ID:  Barbara Mccoy, DOB 19-May-1933, MRN [id number]  PCP:  Burnard Bunting, MD  Cardiologist:  None  Electrophysiologist:  None   Referring MD: Burnard Bunting, MD   Chief Complaint  Patient presents with   Follow-up    2-3 weeks.   Chest Pain     History of Present Illness:    Barbara Mccoy is a 87 y.o. female with a hx of breast cancer, recently diagnosed atrial fibrillation who presents for follow-up.  On 01/02/2021 she presented for lumpectomy and found to be in atrial fibrillation.  She was asymptomatic.  Underwent successful surgery.   She denies any history of A. fib but reports she saw a cardiologist years ago for palpitations and was started on atenolol at that time.  Has been off atenolol for years. Since then, and until her lumpectomy, she had no indication of Afib. Lately, she has occasional palpitations that she describes as "inconsequential" and lasts for a few seconds. She is unsure of how frequent they are, as they cause her no distress and are difficult to detect. The past few days she has felt more fatigued, and notes she has been taking 6 exercise classes a week (1 per day). During her surgery, they performed a lumpectomy only and will treat with radiation therapy in July. She has never been a smoker. Her father had a TIA in his 81's. She denies any chest pain, shortness of breath, or exertional symptoms. No headaches, lightheadedness, or syncope to report. Also has no lower extremity edema, orthopnea or PND. She denies any history of hematuria or blood in her stool, and no bleeding from her surgical site.  Echocardiogram on 01/13/2021 showed normal biventricular function, no significant valvular disease.  Since last clinic visit, she reports having ataxia when walking down hallways which she attributes to her medication. However, this has resolved at this time. Lately she has noticed occasional central chest pressure/tightness  that will occur randomly up to twice a day. Her chest tightness is typically sudden and then gradually dissipates. The longest duration was up to 1 hour. Also, she sometimes is able to feel palpitations, and she is feeling an overall lack of energy. Additionally, she has a new dry cough for the past few days. For her diet, she is no longer drinking coffee or drinking wine with dinner. However, she asks if it is too much of a risk to drink wine again. 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 Previously, she was taking six exercise classes a week. She takes her Eliquis as close as possible to 8:00 in the morning and at night. She denies missing any doses of Eliquis. She denies any shortness of breath, or exertional symptoms. No headaches, lightheadedness, or syncope to report. Also has no lower extremity edema, orthopnea or PND. She is scheduled to begin her radiation therapy on 02/04/2021 for breast cancer.    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, receives injections every 8-12 weeks   Migraine headache 1950   "fewer in old age"   Squamous cell carcinoma of skin of chest 08/05/2020    Past Surgical History:  Procedure Laterality Date   BREAST LUMPECTOMY WITH  RADIOACTIVE SEED LOCALIZATION Left 01/02/2021   Procedure: LEFT BREAST LUMPECTOMY WITH RADIOACTIVE SEED LOCALIZATION;  Surgeon: Erroll Luna, MD;  Location: [facility];  Service: General;  Laterality: Left;   CATARACT EXTRACTION, BILATERAL     INGUINAL HERNIA REPAIR     in 40's   SKIN CANCER EXCISION     TONSILLECTOMY     in childhood   TOTAL ABDOMINAL HYSTERECTOMY     w/ BSO, in 40's    Current Medications: Current Meds  Medication Sig   apixaban (ELIQUIS) 5 MG TABS tablet Take 1 tablet (5 mg  total) by mouth 2 (two) times daily.   cholecalciferol (VITAMIN D3) 25 MCG (1000 UNIT) tablet Take 1,000 Units by mouth daily.   simvastatin (ZOCOR) 20 MG tablet Take 20 mg by mouth daily.   [DISCONTINUED] calcium citrate (CALCITRATE - DOSED IN MG ELEMENTAL CALCIUM) 950 (200 Ca) MG tablet Take 200 mg of elemental calcium by mouth daily.   [DISCONTINUED] metoprolol succinate (TOPROL XL) 25 MG 24 hr tablet Take 1 tablet (25 mg total) by mouth daily.   [DISCONTINUED] Multiple Vitamins-Minerals (ICAPS AREDS 2 PO) Take by mouth.     Allergies:   Codeine   Social History   Socioeconomic History   Marital status: Widowed    Spouse name: Not on file   Number of children: Not on file   Years of education: Not on file   Highest education level: Not on file  Occupational History   Not on file  Tobacco Use   Smoking status: Never   Smokeless tobacco: Never  Substance and Sexual Activity   Alcohol use: Yes    Alcohol/week: 5.0 standard drinks    Types: 4 Glasses of wine, 1 Shots of liquor per week   Drug use: Never   Sexual activity: Not on file  Other Topics Concern   Not on file  Social History Narrative   Not on file   Social Determinants of Health   Financial Resource Strain: Low Risk    Difficulty of Paying Living Expenses: Not hard at all  Food Insecurity: No Food Insecurity   Worried About Charity fundraiser in the Last Year: Never true   [place] in the Last Year: Never true  Transportation Needs: No Transportation Needs   Lack of Transportation (Medical): No   Lack of Transportation (Non-Medical): No  Physical Activity: Not on file  Stress: Not on file  Social Connections: Not on file     Family History: The patient's family history includes Brain cancer in her father.  ROS:   Please see the history of present illness.    (+) Central chest pressure/tightness (+) Palpitations (+) Fatigue (+) Dry cough All other systems reviewed and are  negative.  EKGs/Labs/Other Studies Reviewed:    The following studies were reviewed today:  Echo 01/13/2021:  1. Left ventricular ejection fraction, by estimation, is 60 to 65%. The  left ventricle has normal function. The left ventricle has no regional  wall motion abnormalities. There is mild left ventricular hypertrophy.  Left ventricular diastolic function  could not be evaluated.   2. Right ventricular systolic function is low normal. The right  ventricular size is normal. There is normal pulmonary artery systolic  pressure. The estimated right ventricular systolic pressure is 51.0 mmHg.   3. Right atrial size was top normal.   4. The mitral valve is abnormal. Trivial mitral valve regurgitation.   5. The aortic valve is tricuspid.  Aortic valve regurgitation is not  visualized.   6. The inferior vena cava is dilated in size with >50% respiratory  variability, suggesting right atrial pressure of 8 mmHg.   Comparison(s): No prior Echocardiogram.  US Carotid Duplex 04/01/2020: Right Carotid: Velocities in the right ICA are consistent with a 1-39%  stenosis.   Left Carotid: Velocities in the left ICA are consistent with a 1-39%  stenosis.   Vertebrals:  Bilateral vertebral arteries demonstrate antegrade flow.  Subclavians: Normal flow hemodynamics were seen in bilateral subclavian arteries.   EKG:   01/23/2021: Atrial flutter with variable conduction, rate 65, Q waves in V1/2 01/05/2021: Atrial flutter with variable conduction, rate 83, Q waves in V1/2  Recent Labs: 12/26/2020: ALT 23 01/23/2021: BUN 12; Creatinine, Ser 0.69; Hemoglobin 16.0; Platelets 195; Potassium 4.5; Sodium 139  Recent Lipid Panel No results found for: CHOL, TRIG, HDL, CHOLHDL, VLDL, LDLCALC, LDLDIRECT  Physical Exam:    VS:  BP 108/72 (BP Location: Right Arm, Patient Position: Sitting, Cuff Size: Normal)   Pulse 65   Ht 5\' 5"  (1.651 m)   Wt 163 lb (73.9 kg)   LMP  (LMP Unknown)   BMI 27.12 kg/m      Wt Readings from Last 3 Encounters:  01/23/21 163 lb (73.9 kg)  01/05/21 168 lb 12.8 oz (76.6 kg)  01/02/21 167 lb 15.9 oz (76.2 kg)     GEN: Well nourished, well developed in no acute distress HEENT: Normal NECK: No JVD; No carotid bruits LYMPHATICS: No lymphadenopathy CARDIAC: Irregular rhythm,normal rate, no murmurs, rubs, gallops RESPIRATORY:  Clear to auscultation without rales, wheezing or rhonchi  ABDOMEN: Soft, non-tender, non-distended MUSCULOSKELETAL:  No edema; No deformity  SKIN: Warm and dry NEUROLOGIC:  Alert and oriented x 3 PSYCHIATRIC:  Normal affect   ASSESSMENT:    1. Atrial fibrillation, unspecified type ([place])   2. Pre-procedure lab exam   3. Hyperlipidemia, unspecified hyperlipidemia type     PLAN:    Atrial fibrillation/flutter: New diagnosis, unclear duration of A. fib.  She is asymptomatic.  CHA2DS2-VASc score 3 (age x2, female).  Echocardiogram on 01/13/2021 showed normal biventricular function, no significant valvular disease. -Zio patch x3 days showed low rates (average rate 64) and pauses up to 3 seconds.  She has felt poorly since starting metoprolol, suspect this is due to bradycardia.  Recommend stopping metoprolol. -Continue Eliquis 5 mg twice daily.  She has not missed any doses.  Plan cardioversion to restore sinus rhythm once has completed 3 weeks of anticoagulation (after 6/28).  Hyperlipidemia: On simvastatin 20 mg daily  RTC in 1 month  Medication Adjustments/Labs and Tests Ordered: Current medicines are reviewed at length with the patient today.  Concerns regarding medicines are outlined above.  Orders Placed This Encounter  Procedures   Basic metabolic panel   CBC   EKG 12-Lead    No orders of the defined types were placed in this encounter.   Patient Instructions  Medication Instructions:  STOP metoprolol  *If you need a refill on your cardiac medications before your next appointment, please call your pharmacy*   Lab  Work: BMET, CBC today  If you have labs (blood work) drawn today and your tests are completely normal, you will receive your results only by: [place] (if you have MyChart) OR A paper copy in the mail If you have any lab test that is abnormal or we need to change your treatment, we will call you to review the results.  Testing/Procedures: Your physician has recommended that you have a Cardioversion (DCCV). Electrical Cardioversion uses a jolt of electricity to your heart either through paddles or wired patches attached to your chest. This is a controlled, usually prescheduled, procedure. Defibrillation is done under light anesthesia in the hospital, and you usually go home the day of the procedure. This is done to get your heart back into a normal rhythm. You are not awake for the procedure. Please see the instruction sheet given to you today.  Follow-Up: At [facility], you and your health needs are our priority.  As part of our continuing mission to provide you with exceptional heart care, we have created designated Provider Care Teams.  These Care Teams include your primary Cardiologist (physician) and Advanced Practice Providers (APPs -  Physician Assistants and Nurse Practitioners) who all work together to provide you with the care you need, when you need it.  We recommend signing up for the patient portal called "MyChart".  Sign up information is provided on this After Visit Summary.  MyChart is used to connect with patients for Virtual Visits (Telemedicine).  Patients are able to view lab/test results, encounter notes, upcoming appointments, etc.  Non-urgent messages can be sent to your provider as well.   To learn more about what you can do with MyChart, go to [email or web address].    Your next appointment:   Tuesday, 8/2 at 10 AM with Dr. Gardiner Rhyme    CARDIOVERSION INSTRUCTIONS:  You are scheduled for a Cardioversion on Friday 01/30/21 with Dr. Audie Box.  Please arrive at  the [facility] (Main Entrance A) at [facility]: [place], [place] [address] at 7 AM.  DIET: Nothing to eat or drink after midnight except a sip of water with medications (see medication instructions below)  FYI: For your safety, and to allow [place] to monitor your vital signs accurately during the surgery/procedure we request that   if you have artificial nails, gel coating, SNS etc. Please have those removed prior to your surgery/procedure. Not having the nail coverings /polish removed may result in cancellation or delay of your surgery/procedure.   Medication Instructions:  Continue your anticoagulant: Eliquis You will need to continue your anticoagulant after your procedure until you  are told by your  Provider that it is safe to stop   Labs: today in office   You must have a responsible person to drive you home and stay in the waiting area during your procedure. Failure to do so could result in cancellation.  Bring your insurance cards.  *Special Note: Every effort is made to have your procedure done on time. Occasionally there are emergencies that occur at the hospital that may cause delays. Please be patient if a delay does occur.  ca   [facility] Stumpf,acting as a scribe for Donato Heinz, MD.,have documented all relevant documentation on the behalf of Donato Heinz, MD,as directed by  Donato Heinz, MD while in the presence of Donato Heinz, MD.  I, Donato Heinz, MD, have reviewed all documentation for this visit. The documentation on 01/29/21 for the exam, diagnosis, procedures, and orders are all accurate and complete.   Signed, Donato Heinz, MD  01/29/2021 10:37 PM    [facility]

## 2021-01-20 DIAGNOSIS — I4891 Unspecified atrial fibrillation: Secondary | ICD-10-CM | POA: Diagnosis not present

## 2021-01-23 ENCOUNTER — Ambulatory Visit (INDEPENDENT_AMBULATORY_CARE_PROVIDER_SITE_OTHER): Payer: Medicare Other | Admitting: Cardiology

## 2021-01-23 ENCOUNTER — Other Ambulatory Visit: Payer: Self-pay

## 2021-01-23 ENCOUNTER — Encounter: Payer: Self-pay | Admitting: Cardiology

## 2021-01-23 VITALS — BP 108/72 | HR 65 | Ht 65.0 in | Wt 163.0 lb

## 2021-01-23 DIAGNOSIS — Z01818 Encounter for other preprocedural examination: Secondary | ICD-10-CM | POA: Diagnosis not present

## 2021-01-23 DIAGNOSIS — E785 Hyperlipidemia, unspecified: Secondary | ICD-10-CM | POA: Diagnosis not present

## 2021-01-23 DIAGNOSIS — I4891 Unspecified atrial fibrillation: Secondary | ICD-10-CM

## 2021-01-23 DIAGNOSIS — Z01812 Encounter for preprocedural laboratory examination: Secondary | ICD-10-CM | POA: Diagnosis not present

## 2021-01-23 LAB — CBC
Hematocrit: 46.4 % (ref 34.0–46.6)
Hemoglobin: 16 g/dL — ABNORMAL HIGH (ref 11.1–15.9)
MCH: 31.4 pg (ref 26.6–33.0)
MCHC: 34.5 g/dL (ref 31.5–35.7)
MCV: 91 fL (ref 79–97)
Platelets: 195 10*3/uL (ref 150–450)
RBC: 5.09 x10E6/uL (ref 3.77–5.28)
RDW: 12.7 % (ref 11.7–15.4)
WBC: 6.5 10*3/uL (ref 3.4–10.8)

## 2021-01-23 LAB — BASIC METABOLIC PANEL
BUN/Creatinine Ratio: 17 (ref 12–28)
BUN: 12 mg/dL (ref 8–27)
CO2: 21 mmol/L (ref 20–29)
Calcium: 9.9 mg/dL (ref 8.7–10.3)
Chloride: 103 mmol/L (ref 96–106)
Creatinine, Ser: 0.69 mg/dL (ref 0.57–1.00)
Glucose: 95 mg/dL (ref 65–99)
Potassium: 4.5 mmol/L (ref 3.5–5.2)
Sodium: 139 mmol/L (ref 134–144)
eGFR: 84 mL/min/{1.73_m2} (ref >59)

## 2021-01-23 NOTE — Patient Instructions (Signed)
Medication Instructions:  STOP metoprolol  *If you need a refill on your cardiac medications before your next appointment, please call your pharmacy*   Lab Work: BMET, CBC today  If you have labs (blood work) drawn today and your tests are completely normal, you will receive your results only by: [place] (if you have MyChart) OR A paper copy in the mail If you have any lab test that is abnormal or we need to change your treatment, we will call you to review the results.   Testing/Procedures: Your physician has recommended that you have a Cardioversion (DCCV). Electrical Cardioversion uses a jolt of electricity to your heart either through paddles or wired patches attached to your chest. This is a controlled, usually prescheduled, procedure. Defibrillation is done under light anesthesia in the hospital, and you usually go home the day of the procedure. This is done to get your heart back into a normal rhythm. You are not awake for the procedure. Please see the instruction sheet given to you today.  Follow-Up: At [facility], you and your health needs are our priority.  As part of our continuing mission to provide you with exceptional heart care, we have created designated Provider Care Teams.  These Care Teams include your primary Cardiologist (physician) and Advanced Practice Providers (APPs -  Physician Assistants and Nurse Practitioners) who all work together to provide you with the care you need, when you need it.  We recommend signing up for the patient portal called "MyChart".  Sign up information is provided on this After Visit Summary.  MyChart is used to connect with patients for Virtual Visits (Telemedicine).  Patients are able to view lab/test results, encounter notes, upcoming appointments, etc.  Non-urgent messages can be sent to your provider as well.   To learn more about what you can do with MyChart, go to [email or web address].    Your next appointment:    Tuesday, 8/2 at 10 AM with Dr. Gardiner Rhyme    CARDIOVERSION INSTRUCTIONS:  You are scheduled for a Cardioversion on Friday 01/30/21 with Dr. Audie Box.  Please arrive at the [facility] (Main Entrance A) at [facility]: [place], [place] [address] at 7 AM.  DIET: Nothing to eat or drink after midnight except a sip of water with medications (see medication instructions below)  FYI: For your safety, and to allow [place] to monitor your vital signs accurately during the surgery/procedure we request that   if you have artificial nails, gel coating, SNS etc. Please have those removed prior to your surgery/procedure. Not having the nail coverings /polish removed may result in cancellation or delay of your surgery/procedure.   Medication Instructions:  Continue your anticoagulant: Eliquis You will need to continue your anticoagulant after your procedure until you  are told by your  Provider that it is safe to stop   Labs: today in office   You must have a responsible person to drive you home and stay in the waiting area during your procedure. Failure to do so could result in cancellation.  Bring your insurance cards.  *Special Note: Every effort is made to have your procedure done on time. Occasionally there are emergencies that occur at the hospital that may cause delays. Please be patient if a delay does occur.  ca

## 2021-01-26 ENCOUNTER — Ambulatory Visit (INDEPENDENT_AMBULATORY_CARE_PROVIDER_SITE_OTHER): Payer: Medicare Other | Admitting: Ophthalmology

## 2021-01-26 ENCOUNTER — Encounter (INDEPENDENT_AMBULATORY_CARE_PROVIDER_SITE_OTHER): Payer: Self-pay | Admitting: Ophthalmology

## 2021-01-26 ENCOUNTER — Other Ambulatory Visit: Payer: Self-pay

## 2021-01-26 DIAGNOSIS — H353211 Exudative age-related macular degeneration, right eye, with active choroidal neovascularization: Secondary | ICD-10-CM | POA: Diagnosis not present

## 2021-01-26 DIAGNOSIS — H353123 Nonexudative age-related macular degeneration, left eye, advanced atrophic without subfoveal involvement: Secondary | ICD-10-CM

## 2021-01-26 MED ORDER — BEVACIZUMAB 2.5 MG/0.1ML IZ SOSY
2.5000 mg | PREFILLED_SYRINGE | INTRAVITREAL | Status: AC | PRN
  Administered 2021-01-26: 2.5 mg via INTRAVITREAL

## 2021-01-26 NOTE — Assessment & Plan Note (Signed)
No signs of CNVM today, follow-up as scheduled OS

## 2021-01-26 NOTE — Assessment & Plan Note (Addendum)
And will consider observation alone hereafter ODThe nature of wet macular degeneration was discussed with the patient.  Forms of therapy reviewed include the use of Anti-VEGF medications injected painlessly into the eye, as well as other possible treatment modalities, including thermal laser therapy. Fellow eye involvement and risks were discussed with the patient. Upon the finding of wet age related macular degeneration, treatment will be offered. The treatment regimen is on a treat as needed basis with the intent to treat if necessary and extend interval of exams when possible. On average 1 out of 6 patients do not need lifetime therapy. However, the risk of recurrent disease is high for a lifetime.  Initially monthly, then periodic, examinations and evaluations will determine whether the next treatment is required on the day of the examination.  No signs of recurrent CNVM OD at 3 months (actual 13 weeks.  We will repeat today

## 2021-01-26 NOTE — Progress Notes (Signed)
01/26/2021     CHIEF COMPLAINT Patient presents for Retina Follow Up (13 WK F/U OD, poss Avastin OD//Pt denies noticeable changes to [place] OU since last visit. Pt denies ocular pain, flashes of light, or floaters OU. //)   HISTORY OF PRESENT ILLNESS: Barbara Mccoy is a 87 y.o. female who presents to the clinic today for:   HPI     Retina Follow Up           Diagnosis: Wet AMD   Laterality: right eye   Onset: 13 weeks ago   Severity: mild   Duration: 13 weeks   Course: stable   Comments: 28 WK F/U OD, poss Avastin OD  Pt denies noticeable changes to [place] OU since last visit. Pt denies ocular pain, flashes of light, or floaters OU.          Last edited by Milly Jakob, [place] on 01/26/2021  1:05 PM.      Referring physician: Burnard Bunting, MD [place],  [place] [address]  HISTORICAL INFORMATION:   Selected notes from the [facility]: No current outpatient medications on file. (Ophthalmic Drugs)   No current facility-administered medications for this visit. (Ophthalmic Drugs)   Current Outpatient Medications (Other)  Medication Sig   apixaban (ELIQUIS) 5 MG TABS tablet Take 1 tablet (5 mg total) by mouth 2 (two) times daily.   calcium citrate (CALCITRATE - DOSED IN MG ELEMENTAL CALCIUM) 950 (200 Ca) MG tablet Take 200 mg of elemental calcium by mouth daily.   cholecalciferol (VITAMIN D3) 25 MCG (1000 UNIT) tablet Take 1,000 Units by mouth daily.   Multiple Vitamins-Minerals (ICAPS AREDS 2 PO) Take by mouth.   simvastatin (ZOCOR) 20 MG tablet Take 20 mg by mouth daily.   No current facility-administered medications for this visit. (Other)      REVIEW OF SYSTEMS:    ALLERGIES Allergies  Allergen Reactions   Codeine Rash    PAST MEDICAL HISTORY Past Medical History:  Diagnosis Date   Cataract    bilateral   Irritable bowel 1950   type 3C   Macular degeneration, wet ([place])    bilateral, receives  injections every 8-12 weeks   Migraine headache 1950   "fewer in old age"   Squamous cell carcinoma of skin of chest 08/05/2020   Past Surgical History:  Procedure Laterality Date   BREAST LUMPECTOMY WITH RADIOACTIVE SEED LOCALIZATION Left 01/02/2021   Procedure: LEFT BREAST LUMPECTOMY WITH RADIOACTIVE SEED LOCALIZATION;  Surgeon: Erroll Luna, MD;  Location: [facility];  Service: General;  Laterality: Left;   CATARACT EXTRACTION, BILATERAL     INGUINAL HERNIA REPAIR     in 40's   SKIN CANCER EXCISION     TONSILLECTOMY     in childhood   TOTAL ABDOMINAL HYSTERECTOMY     w/ BSO, in 2's    FAMILY HISTORY Family History  Problem Relation Age of Onset   Brain cancer Father     SOCIAL HISTORY Social History   Tobacco Use   Smoking status: Never   Smokeless tobacco: Never  Substance Use Topics   Alcohol use: Yes    Alcohol/week: 5.0 standard drinks    Types: 4 Glasses of wine, 1 Shots of liquor per week   Drug use: Never         OPHTHALMIC EXAM:  Base Eye Exam     Visual Acuity (ETDRS)  Right Left   Dist cc 20/20 -1 20/20 -1    Correction: Glasses         Tonometry (Tonopen, 1:07 PM)       Right Left   Pressure 18 21         Pupils       Pupils Dark Light Shape React APD   Right PERRL 4 3 Round Brisk None   Left PERRL 4 3 Round Brisk None         Visual Fields (Counting fingers)       Left Right    Full Full         Extraocular Movement       Right Left    Full Full         Neuro/Psych     Oriented x3: Yes   Mood/Affect: Normal         Dilation     Right eye: 1.0% Mydriacyl, 2.5% Phenylephrine @ 1:07 PM           Slit Lamp and Fundus Exam     External Exam       Right Left   External Normal Normal         Slit Lamp Exam       Right Left   Lids/Lashes Normal Normal   Conjunctiva/Sclera White and quiet White and quiet   Cornea Clear Clear   Anterior Chamber Deep and quiet Deep and  quiet   Iris Round and reactive Round and reactive   Lens Posterior chamber intraocular lens, Open posterior capsule Posterior chamber intraocular lens   Anterior Vitreous Normal Normal         Fundus Exam       Right Left   Posterior Vitreous Posterior vitreous detachment partial    Disc Normal    C/D Ratio 0.4    Macula Retinal pigment epithelial mottling, no exudates, Hard drusen, , Retinal pigment epithelial atrophy, Early age related macular degeneration, Soft drusen, no macular thickening    Vessels Normal    Periphery Normal             IMAGING AND PROCEDURES  Imaging and Procedures for 01/26/21  OCT, Retina - OU - Both Eyes       Right Eye Quality was good. Scan locations included subfoveal. Central Foveal Thickness: 240. Progression has been stable. Findings include abnormal foveal contour, vitreomacular adhesion .   Left Eye Quality was good. Scan locations included subfoveal. Central Foveal Thickness: 227. Progression has improved. Findings include abnormal foveal contour, vitreomacular adhesion .      Intravitreal Injection, Pharmacologic Agent - OD - Right Eye       Time Out 01/26/2021. 1:33 PM. Confirmed correct patient, procedure, site, and patient consented.   Anesthesia Topical anesthesia was used. Anesthetic medications included [place] 3.5%.   Procedure Preparation included Tobramycin 0.3%, Ofloxacin , 10% betadine to eyelids, 5% betadine to ocular surface. A 30 gauge needle was used.   Injection: 2.5 mg bevacizumab 2.5 MG/0.1ML   Route: Intravitreal, Site: Right Eye   NDC: [phone], Lot: [id number]   Post-op Post injection exam found visual acuity of at least counting fingers. The patient tolerated the procedure well. There were no complications. The patient received written and verbal post procedure care education. Post injection medications were not given.              ASSESSMENT/PLAN:  Exudative age-related macular degeneration  of right eye with active choroidal neovascularization ([place]) And  will consider observation alone hereafter ODThe nature of wet macular degeneration was discussed with the patient.  Forms of therapy reviewed include the use of Anti-VEGF medications injected painlessly into the eye, as well as other possible treatment modalities, including thermal laser therapy. Fellow eye involvement and risks were discussed with the patient. Upon the finding of wet age related macular degeneration, treatment will be offered. The treatment regimen is on a treat as needed basis with the intent to treat if necessary and extend interval of exams when possible. On average 1 out of 6 patients do not need lifetime therapy. However, the risk of recurrent disease is high for a lifetime.  Initially monthly, then periodic, examinations and evaluations will determine whether the next treatment is required on the day of the examination.  No signs of recurrent CNVM OD at 3 months (actual 13 weeks.  We will repeat today  Advanced nonexudative age-related macular degeneration of left eye without subfoveal involvement No signs of CNVM today, follow-up as scheduled OS     ICD-10-CM   1. 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  OD, controlled CNVM currently, no signs of recurrence at 13-week interval.  In an eye right eye with a history of recurrences.  We will repeat today for maintenance and will follow-up 14-15 weeks no planned injection OD next  2.  Follow-up OS as scheduled  3.  Ophthalmic Meds Ordered this visit:  Meds ordered this encounter  Medications   bevacizumab (AVASTIN) SOSY 2.5 mg        Return in about 14 weeks (around 05/04/2021) for dilate, OD, no planned injection.  There are no Patient Instructions on file for this visit.   Explained the diagnoses, plan, and follow up with the patient and they expressed understanding.  Patient expressed understanding of the importance of proper follow up care.   Clent Demark Pearson Picou M.D. Diseases & Surgery of the Retina and Vitreous Retina & Diabetic [place] 01/26/21     Abbreviations: M myopia (nearsighted); A astigmatism; H hyperopia (farsighted); P presbyopia; Mrx spectacle prescription;  CTL contact lenses; OD right eye; OS left eye; OU both eyes  XT exotropia; ET esotropia; PEK punctate epithelial keratitis; PEE punctate epithelial erosions; DES dry eye syndrome; MGD meibomian gland dysfunction; ATs artificial tears; PFAT's preservative free artificial tears; [place] nuclear sclerotic cataract; PSC posterior subcapsular cataract; ERM epi-retinal membrane; PVD posterior vitreous detachment; RD retinal detachment; DM diabetes mellitus; DR diabetic retinopathy; NPDR non-proliferative diabetic retinopathy; PDR proliferative diabetic retinopathy; CSME clinically significant macular edema; DME diabetic macular edema; dbh dot blot hemorrhages; CWS cotton wool spot; POAG primary open angle glaucoma; C/D cup-to-disc ratio; HVF humphrey visual field; GVF goldmann visual field; OCT optical coherence tomography; IOP intraocular pressure; BRVO Branch retinal vein occlusion; CRVO central retinal vein occlusion; CRAO central retinal artery occlusion; BRAO branch retinal artery occlusion; RT retinal tear; SB scleral buckle; PPV pars plana vitrectomy; VH Vitreous hemorrhage; PRP panretinal laser photocoagulation; IVK intravitreal kenalog; VMT vitreomacular traction; MH Macular hole;  NVD neovascularization of the disc; NVE neovascularization elsewhere; AREDS age related eye disease study; ARMD age related macular degeneration; POAG primary open angle glaucoma;  EBMD  epithelial/anterior basement membrane dystrophy; ACIOL anterior chamber intraocular lens; IOL intraocular lens; PCIOL posterior chamber intraocular lens; Phaco/IOL phacoemulsification with intraocular lens placement; [place] photorefractive keratectomy; LASIK laser assisted in situ keratomileusis; HTN hypertension; DM diabetes mellitus; COPD chronic obstructive pulmonary disease

## 2021-01-30 ENCOUNTER — Other Ambulatory Visit: Payer: Self-pay

## 2021-01-30 ENCOUNTER — Ambulatory Visit (HOSPITAL_COMMUNITY): Payer: Medicare Other | Admitting: Anesthesiology

## 2021-01-30 ENCOUNTER — Encounter (HOSPITAL_COMMUNITY)
Admission: RE | Disposition: A | Discharge status: Home / Self Care | Payer: Self-pay | Source: Home / Self Care | Attending: Cardiovascular Disease

## 2021-01-30 ENCOUNTER — Encounter (HOSPITAL_COMMUNITY): Payer: Self-pay | Admitting: Cardiovascular Disease

## 2021-01-30 ENCOUNTER — Ambulatory Visit (HOSPITAL_COMMUNITY)
Admission: RE | Admit: 2021-01-30 | Discharge: 2021-01-30 | Disposition: A | Discharge status: Home / Self Care | Payer: Medicare Other | Attending: Cardiovascular Disease | Admitting: Cardiovascular Disease

## 2021-01-30 DIAGNOSIS — Z79899 Other long term (current) drug therapy: Secondary | ICD-10-CM | POA: Insufficient documentation

## 2021-01-30 DIAGNOSIS — Z853 Personal history of malignant neoplasm of breast: Secondary | ICD-10-CM | POA: Insufficient documentation

## 2021-01-30 DIAGNOSIS — E785 Hyperlipidemia, unspecified: Secondary | ICD-10-CM | POA: Insufficient documentation

## 2021-01-30 DIAGNOSIS — Z885 Allergy status to narcotic agent status: Secondary | ICD-10-CM | POA: Insufficient documentation

## 2021-01-30 DIAGNOSIS — C44529 Squamous cell carcinoma of skin of other part of trunk: Secondary | ICD-10-CM | POA: Diagnosis not present

## 2021-01-30 DIAGNOSIS — Z85828 Personal history of other malignant neoplasm of skin: Secondary | ICD-10-CM | POA: Insufficient documentation

## 2021-01-30 DIAGNOSIS — I4891 Unspecified atrial fibrillation: Secondary | ICD-10-CM | POA: Diagnosis not present

## 2021-01-30 DIAGNOSIS — D0512 Intraductal carcinoma in situ of left breast: Secondary | ICD-10-CM | POA: Diagnosis not present

## 2021-01-30 DIAGNOSIS — G43B0 Ophthalmoplegic migraine, not intractable: Secondary | ICD-10-CM | POA: Diagnosis not present

## 2021-01-30 DIAGNOSIS — Z7901 Long term (current) use of anticoagulants: Secondary | ICD-10-CM | POA: Insufficient documentation

## 2021-01-30 HISTORY — PX: CARDIOVERSION: SHX1299

## 2021-01-30 SURGERY — CARDIOVERSION
Anesthesia: General

## 2021-01-30 MED ORDER — LIDOCAINE 2% (20 MG/ML) 5 ML SYRINGE
INTRAMUSCULAR | Status: DC | PRN
  Administered 2021-01-30: 50 mg via INTRAVENOUS

## 2021-01-30 MED ORDER — PROPOFOL 10 MG/ML IV BOLUS
INTRAVENOUS | Status: DC | PRN
  Administered 2021-01-30: 50 mg via INTRAVENOUS

## 2021-01-30 MED ORDER — SODIUM CHLORIDE 0.9 % IV SOLN: INTRAVENOUS | Status: DC | PRN

## 2021-01-30 NOTE — Interval H&P Note (Signed)
History and Physical Interval Note:  01/30/2021 7:50 AM  Barbara Mccoy  has presented today for surgery, with the diagnosis of afib.  The various methods of treatment have been discussed with the patient and family. After consideration of risks, benefits and other options for treatment, the patient has consented to  Procedure(s): CARDIOVERSION (N/A) as a surgical intervention.  The patient's history has been reviewed, patient examined, no change in status, stable for surgery.  I have reviewed the patient's chart and labs.  Questions were answered to the patient's satisfaction.    NPO for DCCV. No missed doses of eliquis >3 weeks.   [place] Bells T. Audie Box, MD, [place]  [address], [place], [place] [address] [phone]  7:51 AM

## 2021-01-30 NOTE — Anesthesia Postprocedure Evaluation (Signed)
Anesthesia Post Note  Patient: Barbara Mccoy  Procedure(s) Performed: CARDIOVERSION     Patient location during evaluation: Endoscopy Anesthesia Type: General Level of consciousness: awake and alert Pain management: pain level controlled Vital Signs Assessment: post-procedure vital signs reviewed and stable Respiratory status: spontaneous breathing, nonlabored ventilation, respiratory function stable and patient connected to nasal cannula oxygen Cardiovascular status: blood pressure returned to baseline and stable Postop Assessment: no apparent nausea or vomiting Anesthetic complications: no   No notable events documented.  Last Vitals:  Vitals:   01/30/21 0838 01/30/21 0848  BP: (!) 160/58 118/63  Pulse: 71 (!) 57  Resp: (!) 23 13  Temp:    SpO2: 97% 97%    Last Pain:  Vitals:   01/30/21 0848  TempSrc:   PainSc: 0-No pain                 [name]

## 2021-01-30 NOTE — Anesthesia Preprocedure Evaluation (Addendum)
Anesthesia Evaluation  Patient identified by MRN, date of birth, ID band Patient awake    Reviewed: Allergy & Precautions, NPO status , Patient's Chart, lab work & pertinent test results  Airway Mallampati: II  TM Distance: >3 FB Neck ROM: Full    Dental no notable dental hx. (+) Teeth Intact, Dental Advisory Given   Pulmonary neg pulmonary ROS,    Pulmonary exam normal breath sounds clear to auscultation       Cardiovascular Normal cardiovascular exam+ dysrhythmias Atrial Fibrillation  Rhythm:Irregular Rate:Normal  TTE 2022 1. Left ventricular ejection fraction, by estimation, is 60 to 65%. The  left ventricle has normal function. The left ventricle has no regional  wall motion abnormalities. There is mild left ventricular hypertrophy.  Left ventricular diastolic function  could not be evaluated.  2. Right ventricular systolic function is low normal. The right  ventricular size is normal. There is normal pulmonary artery systolic  pressure. The estimated right ventricular systolic pressure is 78.4 mmHg.  3. Right atrial size was top normal.  4. The mitral valve is abnormal. Trivial mitral valve regurgitation.  5. The aortic valve is tricuspid. Aortic valve regurgitation is not  visualized.  6. The inferior vena cava is dilated in size with >50% respiratory  variability, suggesting right atrial pressure of 8 mmHg.   Neuro/Psych  Headaches, negative psych ROS   GI/Hepatic negative GI ROS, Neg liver ROS,   Endo/Other  negative endocrine ROS  Renal/GU negative Renal ROS  negative genitourinary   Musculoskeletal negative musculoskeletal ROS (+)   Abdominal   Peds  Hematology  (+) Blood dyscrasia (on eliquis, no missed doses), ,   Anesthesia Other Findings   Reproductive/Obstetrics                            Anesthesia Physical Anesthesia Plan  ASA: 3  Anesthesia Plan: General    Post-op Pain Management:    Induction: Intravenous  PONV Risk Score and Plan: 3 and Propofol infusion and Treatment may vary due to age or medical condition  Airway Management Planned: Natural Airway  Additional Equipment:   Intra-op Plan:   Post-operative Plan:   Informed Consent: I have reviewed the patients History and Physical, chart, labs and discussed the procedure including the risks, benefits and alternatives for the proposed anesthesia with the patient or authorized representative who has indicated his/her understanding and acceptance.     Dental advisory given  Plan Discussed with: CRNA  Anesthesia Plan Comments:         Anesthesia Quick Evaluation

## 2021-01-30 NOTE — CV Procedure (Signed)
   DIRECT CURRENT CARDIOVERSION  NAME:  Barbara Mccoy    MRN: [id number] DOB:  30-Sep-1932    ADMIT DATE: 01/30/2021  Indication:  Symptomatic atrial fibrillation  Procedure Note:  The patient signed informed consent.  They have had had therapeutic anticoagulation with Eliquis greater than 3 weeks.  Anesthesia was administered by Dr. Lanetta Inch.  Adequate airway was maintained throughout and vital followed per protocol.  They were cardioverted x 1 with 200J of biphasic synchronized energy.  They converted to NSR.  There were no apparent complications.  The patient had normal neuro status and respiratory status post procedure with vitals stable as recorded elsewhere.    Follow up: They will continue on current medical therapy and follow up with cardiology as scheduled.  [place] Bells T. Audie Box, MD, [facility]  [address] [place], [place] [address] [phone]  8:18 AM

## 2021-01-30 NOTE — Anesthesia Procedure Notes (Signed)
Procedure Name: General with mask airway Date/Time: 01/30/2021 8:13 AM Performed by: Jenne Campus, CRNA Pre-anesthesia Checklist: Patient identified, Emergency Drugs available, Suction available and Patient being monitored Patient Re-evaluated:Patient Re-evaluated prior to induction Oxygen Delivery Method: Ambu bag

## 2021-01-30 NOTE — Transfer of Care (Signed)
Immediate Anesthesia Transfer of Care Note  Patient: Barbara Mccoy  Procedure(s) Performed: CARDIOVERSION  Patient Location: Endoscopy Unit  Anesthesia Type:General  Level of Consciousness: awake, oriented and patient cooperative  Airway & Oxygen Therapy: Patient Spontanous Breathing and Patient connected to nasal cannula oxygen  Post-op Assessment: Report given to RN  Post vital signs: Reviewed  Last Vitals:  Vitals Value Taken Time  BP    Temp    Pulse    Resp    SpO2      Last Pain:  Vitals:   01/30/21 1460  TempSrc: Oral  PainSc: 0-No pain         Complications: No notable events documented.

## 2021-02-02 NOTE — Progress Notes (Signed)
Radiation Oncology         [phone] ________________________________  Outpatient followup  Name: Barbara Mccoy MRN: [id number]  Date: 02/04/2021  DOB: 05/09/1933  CZ:YSAYTKZ, Delfino Lovett, MD  Magrinat, Virgie Dad, MD   REFERRING PHYSICIAN: Magrinat, Virgie Dad, MD  DIAGNOSIS:    ICD-10-CM   1. Ductal carcinoma in situ (DCIS) of left breast  D05.12 Ambulatory referral to Social Work       Cancer Staging Ductal carcinoma in situ (DCIS) of left breast Staging form: Breast, AJCC 8th Edition - Clinical stage from 12/24/2020: Stage 0 (cTis (DCIS), cN0, cM0, ER+, PR+, HER2: Not Assessed) - Unsigned Stage prefix: Initial diagnosis Nuclear grade: G3 Laterality: Left Staged by: Pathologist and managing physician Stage used in treatment planning: Yes National guidelines used in treatment planning: Yes Type of national guideline used in treatment planning: NCCN   CHIEF COMPLAINT: Here to discuss management of left breast cancer  HISTORY OF PRESENT ILLNESS::Barbara Mccoy is a 87 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 She first presented with left breast abnormality on the following imaging: bilateral screening mammogram on the date of 11/10/2020. No symptoms were reported at that time. Left diagnostic mammogram on 11/20/2020 revealed a 7 mm cluster of amorphous calcifications in the left breast that were suspicious for Malignancy.  Biopsy of the left breast  on 12/10/20 revealed high-grade DCIS with necrosis. Her disease is ER positive.  The patient underwent left breast lumpectomy on 01/02/21 which revealed low grade ductal carcinoma in situ spanning approximately 1.5 cm, and lobular neoplasia (atypical lobular hyperplasia). All three excised margins were benign. Prognostic indicators: ER 95% positive , PR 20% positive, both with strong staining intensity.  Of note: During lumpectomy, the patient was found to be in atrial fibrillation, however  she was asymptomatic at the time. The patient soon after presented to Dr. Gardiner Rhyme on 01/05/21 in regards to her afib. During the visit the patient stated that she had a brief history of palpitations years ago and was started on atenolol at the time. Until the patient received her lumpectomy, she had not recent indications of Afib to her knowledge.  The patient presented to [facility] on 01/30[facility]21 for cardioversion surgery in treatment of her atrial fibrillation and was reported to tolerate the procedure well.  She has noticed more ecchymoses since starting Eliquis  She is opting out of antiestrogen therapy   PREVIOUS RADIATION THERAPY: No  PAST MEDICAL HISTORY:  has a past medical history of Cataract, Irritable bowel (1950), Macular degeneration, wet ([place]), Migraine headache (1950), and Squamous cell carcinoma of skin of chest (08/05/2020).    PAST SURGICAL HISTORY: Past Surgical History:  Procedure Laterality Date   BREAST LUMPECTOMY WITH RADIOACTIVE SEED LOCALIZATION Left 01/02/2021   Procedure: LEFT BREAST LUMPECTOMY WITH RADIOACTIVE SEED LOCALIZATION;  Surgeon: Erroll Luna, MD;  Location: [place];  Service: General;  Laterality: Left;   CARDIOVERSION N/A 01/30/2021   Procedure: CARDIOVERSION;  Surgeon: Geralynn Rile, MD;  Location: [place];  Service: Cardiovascular;  Laterality: N/A;   CATARACT EXTRACTION, BILATERAL     INGUINAL HERNIA REPAIR     in 32's   SKIN CANCER EXCISION     TONSILLECTOMY     in childhood   TOTAL ABDOMINAL HYSTERECTOMY     w/ BSO, in 40's    FAMILY HISTORY: family history includes Brain cancer in her father. Her mother lived into her 81s.  Her father died in his  60s of an astrocytoma.  SOCIAL HISTORY:  reports that she has never smoked. She has never used smokeless tobacco. She reports current alcohol use of about 5.0 standard drinks of alcohol per week. She reports that she does not use drugs.  ALLERGIES:  Codeine  MEDICATIONS:  Current Outpatient Medications  Medication Sig Dispense Refill   apixaban (ELIQUIS) 5 MG TABS tablet Take 1 tablet (5 mg total) by mouth 2 (two) times daily. 60 tablet 0   cholecalciferol (VITAMIN D3) 25 MCG (1000 UNIT) tablet Take 1,000 Units by mouth daily.     MAGNESIUM CITRATE PO Take 250 mg by mouth daily.     Multiple Minerals-Vitamins (GNP CAL MAG ZINC +D3 PO) Take 1 tablet by mouth in the morning and at bedtime.     Multiple Vitamins-Minerals (PRESERVISION AREDS 2+MULTI VIT PO) Take 1 capsule by mouth in the morning and at bedtime.     simvastatin (ZOCOR) 20 MG tablet Take 20 mg by mouth daily.     No current facility-administered medications for this encounter.    REVIEW OF SYSTEMS: As above   PHYSICAL EXAM:   Vitals:   02/04/21 0913  BP: (!) 145/81  Pulse: 72  Resp: 18  Temp: (!) 96.9 F (36.1 C)  SpO2: 99%    General: Alert and oriented, in no acute distress   Breasts: Satisfactory healing of left breast, status post lumpectomy   ECOG = 0  0 - Asymptomatic (Fully active, able to carry on all predisease activities without restriction)  1 - Symptomatic but completely ambulatory (Restricted in physically strenuous activity but ambulatory and able to carry out work of a light or sedentary nature. For example, light housework, office work)  2 - Symptomatic, <50% in bed during the day (Ambulatory and capable of all self care but unable to carry out any work activities. Up and about more than 50% of waking hours)  3 - Symptomatic, >50% in bed, but not bedbound (Capable of only limited self-care, confined to bed or chair 50% or more of waking hours)  4 - Bedbound (Completely disabled. Cannot carry on any self-care. Totally confined to bed or chair)  5 - Death   Santiago Glad MM, Creech RH, Tormey DC, et al. [phone]). "Toxicity and response criteria of the [facility]". Am. Evlyn Clines. Oncol. 5 (6): 649-55   LABORATORY DATA:  Lab  Results  Component Value Date   WBC 6.5 01/23/2021   HGB 16.0 (H) 01/23/2021   HCT 46.4 01/23/2021   MCV 91 01/23/2021   PLT 195 01/23/2021   CMP     Component Value Date/Time   NA 139 01/23/2021 1103   K 4.5 01/23/2021 1103   CL 103 01/23/2021 1103   CO2 21 01/23/2021 1103   GLUCOSE 95 01/23/2021 1103   GLUCOSE 101 (H) 12/26/2020 1559   BUN 12 01/23/2021 1103   CREATININE 0.69 01/23/2021 1103   CREATININE 0.67 12/24/2020 0816   CALCIUM 9.9 01/23/2021 1103   PROT 6.3 (L) 12/26/2020 1559   ALBUMIN 4.0 12/26/2020 1559   AST 23 12/26/2020 1559   AST 24 12/24/2020 0816   ALT 23 12/26/2020 1559   ALT 25 12/24/2020 0816   ALKPHOS 74 12/26/2020 1559   BILITOT 0.8 12/26/2020 1559   BILITOT 0.6 12/24/2020 0816   GFRNONAA >60 12/26/2020 1559   GFRNONAA >60 12/24/2020 0816         RADIOGRAPHY: ECHOCARDIOGRAM COMPLETE  Result Date: 01/13/2021    ECHOCARDIOGRAM REPORT  Patient Name:   BAILLEY GUILFORD Date of Exam: 01/13/2021 Medical Rec #:  [id number]     Height:       65.0 in Accession #:    [id number]    Weight:       168.8 lb Date of Birth:  1933-02-22     BSA:          1.841 m Patient Age:    44 years      BP:           136/76 mmHg Patient Gender: F             HR:           68 bpm. Exam Location:  [place] Procedure: 2D Echo Indications:    Atrial Fibrillation I48.91  History:        Patient has no prior history of Echocardiogram examinations.  Sonographer:    Mikki Santee RDCS Referring Phys: [id number] [place]  1. Left ventricular ejection fraction, by estimation, is 60 to 65%. The left ventricle has normal function. The left ventricle has no regional wall motion abnormalities. There is mild left ventricular hypertrophy. Left ventricular diastolic function could not be evaluated.  2. Right ventricular systolic function is low normal. The right ventricular size is normal. There is normal pulmonary artery systolic pressure. The estimated right ventricular  systolic pressure is 53.6 mmHg.  3. Right atrial size was top normal.  4. The mitral valve is abnormal. Trivial mitral valve regurgitation.  5. The aortic valve is tricuspid. Aortic valve regurgitation is not visualized.  6. The inferior vena cava is dilated in size with >50% respiratory variability, suggesting right atrial pressure of 8 mmHg. Comparison(s): No prior Echocardiogram. FINDINGS  Left Ventricle: Left ventricular ejection fraction, by estimation, is 60 to 65%. The left ventricle has normal function. The left ventricle has no regional wall motion abnormalities. The left ventricular internal cavity size was normal in size. There is  mild left ventricular hypertrophy. Left ventricular diastolic function could not be evaluated due to atrial fibrillation. Left ventricular diastolic function could not be evaluated. Right Ventricle: The right ventricular size is normal. No increase in right ventricular wall thickness. Right ventricular systolic function is low normal. There is normal pulmonary artery systolic pressure. The tricuspid regurgitant velocity is 2.46 m/s,  and with an assumed right atrial pressure of 8 mmHg, the estimated right ventricular systolic pressure is 46.8 mmHg. Left Atrium: Left atrial size was normal in size. Right Atrium: Right atrial size was top normal. Pericardium: There is no evidence of pericardial effusion. Mitral Valve: The mitral valve is abnormal. There is mild thickening of the mitral valve leaflet(s). Trivial mitral valve regurgitation. Tricuspid Valve: The tricuspid valve is grossly normal. Tricuspid valve regurgitation is trivial. Aortic Valve: The aortic valve is tricuspid. Aortic valve regurgitation is not visualized. Pulmonic Valve: The pulmonic valve was grossly normal. Pulmonic valve regurgitation is trivial. Aorta: The aortic root and ascending aorta are structurally normal, with no evidence of dilitation. Venous: The inferior vena cava is dilated in size with greater  than 50% respiratory variability, suggesting right atrial pressure of 8 mmHg. IAS/Shunts: No atrial level shunt detected by color flow Doppler. EKG: Rhythm strip during this exam demostrated atrial flutter.  LEFT VENTRICLE PLAX 2D LVIDd:         4.30 cm LVIDs:         2.50 cm LV PW:         1.10 cm  LV IVS:        1.10 cm LVOT diam:     2.00 cm LV SV:         47 LV SV Index:   26 LVOT Area:     3.14 cm  RIGHT VENTRICLE RV S prime:     8.81 cm/s TAPSE (M-mode): 1.8 cm LEFT ATRIUM             Index       RIGHT ATRIUM           Index LA diam:        3.40 cm 1.85 cm/m  RA Area:     20.40 cm LA Vol (A2C):   44.4 ml 24.12 ml/m RA Volume:   57.80 ml  31.40 ml/m LA Vol (A4C):   37.3 ml 20.27 ml/m LA Biplane Vol: 41.9 ml 22.76 ml/m  AORTIC VALVE LVOT Vmax:   69.93 cm/s LVOT Vmean:  48.500 cm/s LVOT VTI:    0.151 m  AORTA Ao Root diam: 2.70 cm TRICUSPID VALVE TR Peak grad:   24.2 mmHg TR Vmax:        246.00 cm/s  SHUNTS Systemic VTI:  0.15 m Systemic Diam: 2.00 cm Lyman Bishop MD Electronically signed by Lyman Bishop MD Signature Date/Time: 01/13/2021/11:20:04 AM    Final    Intravitreal Injection, Pharmacologic Agent - OD - Right Eye  Result Date: 01/26/2021 Time Out 01/26/2021. 1:33 PM. Confirmed correct patient, procedure, site, and patient consented. Anesthesia Topical anesthesia was used. Anesthetic medications included Akten 3.5%. Procedure Preparation included Tobramycin 0.3%, Ofloxacin , 10% betadine to eyelids, 5% betadine to ocular surface. A 30 gauge needle was used. Injection: 2.5 mg bevacizumab 2.5 MG/0.1ML   Route: Intravitreal, Site: Right Eye   NDC: [phone], Lot: [id number] Post-op Post injection exam found visual acuity of at least counting fingers. The patient tolerated the procedure well. There were no complications. The patient received written and verbal post procedure care education. Post injection medications were not given.   OCT, Retina - OU - Both Eyes  Result Date: 01/26/2021 Right  Eye Quality was good. Scan locations included subfoveal. Central Foveal Thickness: 240. Progression has been stable. Findings include abnormal foveal contour, vitreomacular adhesion . Left Eye Quality was good. Scan locations included subfoveal. Central Foveal Thickness: 227. Progression has improved. Findings include abnormal foveal contour, vitreomacular adhesion .       IMPRESSION/PLAN: Left breast DCIS    We discussed adjuvant radiotherapy today.  I recommend radiation to the left breast over 3 weeks in order to reduce risk of locoregional recurrence by 1/2.  I reviewed the logistics, benefits, risks, and potential side effects of this treatment in detail. Risks may include but not necessary be limited to acute and late injury tissue in the radiation fields such as skin irritation (change in color/pigmentation, itching, dryness, pain, peeling). She may experience fatigue. We also discussed possible risk of long term cosmetic changes or scar tissue. There is also a smaller risk for lung toxicity, cardiac toxicity,  lymphedema, musculoskeletal changes, rib fragility, late chronic non-healing soft tissue wound.    The patient asked good questions which I answered to her satisfaction. She is enthusiastic about proceeding with treatment. A consent form has been signed and placed in her chart.  Proceed with treatment planning today and treatment will start in about a week.  On date of service, in total, I spent 30 minutes on this encounter. Patient was seen in person.  __________________________________________   Eppie Gibson, MD  This document serves as a record of services personally performed by Eppie Gibson, MD. It was created on her behalf by Roney Mans, a trained medical scribe. The creation of this record is based on the scribe's personal observations and the provider's statements to them. This document has been checked and approved by the attending provider.

## 2021-02-03 NOTE — Progress Notes (Signed)
Location of Breast Cancer:  Ductal carcinoma in situ (DCIS) of LEFT breast  Histology per Pathology Report:  01/02/2021 FINAL MICROSCOPIC DIAGNOSIS:  A. BREAST, LEFT, LUMPECTOMY:  - Low grade ductal carcinoma in situ, spanning approximately 1.5 cm.  - Lobular neoplasia (atypical lobular hyperplasia).  - Biopsy site.  - Final margins (parts B-D) are negative.  - See oncology table.  B. BREAST, LEFT ADDITIONAL ANTEROSUPERIOR MARGIN, EXCISION:  - Benign breast tissue.  C. BREAST, LEFT ADDITIONAL POSTEROMEDIAL MARGIN, EXCISION:  - Benign breast tissue.  D. BREAST, LEFT ADDITIONAL LATEROINFERIOR MARGIN, EXCISION:  - Benign breast tissue.   Receptor Status: ER(95%), PR (20%)   Did patient present with symptoms (if so, please note symptoms) or was this found on screening mammography?:  Patient had routine screening mammography on 11/10/2020 showing a possible abnormality in the left breast. She underwent left diagnostic mammography with tomography at [facility] on 11/20/2020 showing: breast density category B; 7 mm cluster of amorphous calcifications in left breast at 5 o'clock.  Past/Anticipated interventions by surgeon, if any:  01/02/2021 Dr. Marcello Moores Cornett Left breast seed localized lumpectomy  Past/Anticipated interventions by medical oncology, if any:  Under care of Dr. Sarajane Jews Magrinat 12/24/2020 (1) definitive surgery pending   --done 01/02/2021 (2) adjuvant radiation to follow (3) we will forgo antiestrogen treatment  --given the patient's age, despite her excellent health, and the fact that antiestrogens can have significant side effects (osteoporosis from the aromatase inhibitors, blood clots from tamoxifen) we decided the potential harm outweighed the benefits and she will be followed with observation alone, no antiestrogens.  Lymphedema issues, if any:  Patient denies. Reports good/full range of motion to the left arm/shoulder  Pain issues, if any:  Reports mild  tenderness/sensitivity to surgical incision    SAFETY ISSUES: Prior radiation? No Pacemaker/ICD? No Possible current pregnancy?No--hysterectomy Is the patient on methotrexate? No  Current Complaints / other details:  Had cardioversion done on 01/30/2021 for symptomatic a.fib. Patient has received the first 3 Moderna vaccines, and plans to receive a booster this Fall

## 2021-02-04 ENCOUNTER — Ambulatory Visit
Admission: RE | Admit: 2021-02-04 | Discharge: 2021-02-04 | Disposition: A | Discharge status: Home / Self Care | Payer: Medicare Other | Source: Ambulatory Visit | Attending: Radiation Oncology | Admitting: Radiation Oncology

## 2021-02-04 ENCOUNTER — Encounter: Payer: Self-pay | Admitting: Radiation Oncology

## 2021-02-04 ENCOUNTER — Other Ambulatory Visit: Payer: Self-pay

## 2021-02-04 VITALS — BP 145/81 | HR 72 | Temp 96.9°F | Resp 18 | Ht 65.0 in | Wt 165.0 lb

## 2021-02-04 DIAGNOSIS — Z7901 Long term (current) use of anticoagulants: Secondary | ICD-10-CM | POA: Diagnosis not present

## 2021-02-04 DIAGNOSIS — I4891 Unspecified atrial fibrillation: Secondary | ICD-10-CM | POA: Insufficient documentation

## 2021-02-04 DIAGNOSIS — D0512 Intraductal carcinoma in situ of left breast: Secondary | ICD-10-CM | POA: Insufficient documentation

## 2021-02-04 DIAGNOSIS — Z79899 Other long term (current) drug therapy: Secondary | ICD-10-CM | POA: Insufficient documentation

## 2021-02-04 DIAGNOSIS — Z510 Encounter for antineoplastic radiation therapy: Secondary | ICD-10-CM | POA: Diagnosis not present

## 2021-02-04 DIAGNOSIS — Z170 Estrogen receptor positive status [ER+]: Secondary | ICD-10-CM | POA: Diagnosis not present

## 2021-02-05 ENCOUNTER — Encounter: Payer: Self-pay | Admitting: Licensed Clinical Social Worker

## 2021-02-05 NOTE — Progress Notes (Signed)
[name] Psychosocial Distress Screening Clinical Social Work  Clinical Social Work was referred by distress screening protocol.  The patient scored a 8 on the Psychosocial Distress Thermometer which indicates severe distress. Clinical Social Worker contacted patient by phone to assess for distress and other psychosocial needs.   Patient reports to be doing very well and looking forward to starting radiation treatment next week. 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 She continues to be active, although is not exercising as much as before (was previously doing 6 classes a week). No needs at this time but pt knows she can reach out as needed.  ONCBCN DISTRESS SCREENING 02/04/2021  Screening Type Initial Screening  Distress experienced in past week (1-10) 8  Emotional problem type Adjusting to illness;Adjusting to appearance changes  Information Concerns Type   Physical Problem type Sleep/insomnia  Physician notified of physical symptoms Yes  Referral to clinical psychology No  Referral to clinical social work Yes  Referral to dietition No  Referral to financial advocate No  Referral to support programs Yes  Referral to palliative care No    Clinical Social Worker follow up needed: No.  If yes, follow up plan:  Barbara Mccoy E Davionte Lusby, LCSW

## 2021-02-06 DIAGNOSIS — Z510 Encounter for antineoplastic radiation therapy: Secondary | ICD-10-CM | POA: Diagnosis not present

## 2021-02-06 DIAGNOSIS — D0512 Intraductal carcinoma in situ of left breast: Secondary | ICD-10-CM | POA: Diagnosis not present

## 2021-02-09 ENCOUNTER — Encounter: Payer: Self-pay | Admitting: *Deleted

## 2021-02-10 ENCOUNTER — Other Ambulatory Visit: Payer: Self-pay | Admitting: Cardiology

## 2021-02-10 NOTE — Telephone Encounter (Signed)
89f, 73.9kg, scr 0.69 01/23/21, lovw/schumann 01/23/21

## 2021-02-11 ENCOUNTER — Ambulatory Visit: Payer: Medicare Other | Admitting: Radiation Oncology

## 2021-02-12 ENCOUNTER — Ambulatory Visit
Admission: RE | Admit: 2021-02-12 | Discharge: 2021-02-12 | Disposition: A | Discharge status: Home / Self Care | Payer: Medicare Other | Source: Ambulatory Visit | Attending: Radiation Oncology | Admitting: Radiation Oncology

## 2021-02-12 ENCOUNTER — Other Ambulatory Visit: Payer: Self-pay

## 2021-02-12 DIAGNOSIS — D0512 Intraductal carcinoma in situ of left breast: Secondary | ICD-10-CM | POA: Diagnosis not present

## 2021-02-12 DIAGNOSIS — Z510 Encounter for antineoplastic radiation therapy: Secondary | ICD-10-CM | POA: Diagnosis not present

## 2021-02-13 ENCOUNTER — Ambulatory Visit
Admission: RE | Admit: 2021-02-13 | Discharge: 2021-02-13 | Disposition: A | Discharge status: Home / Self Care | Payer: Medicare Other | Source: Ambulatory Visit | Attending: Radiation Oncology | Admitting: Radiation Oncology

## 2021-02-13 DIAGNOSIS — Z510 Encounter for antineoplastic radiation therapy: Secondary | ICD-10-CM | POA: Diagnosis not present

## 2021-02-13 DIAGNOSIS — D0512 Intraductal carcinoma in situ of left breast: Secondary | ICD-10-CM | POA: Diagnosis not present

## 2021-02-16 ENCOUNTER — Other Ambulatory Visit: Payer: Self-pay

## 2021-02-16 ENCOUNTER — Encounter (INDEPENDENT_AMBULATORY_CARE_PROVIDER_SITE_OTHER): Payer: Self-pay | Admitting: Ophthalmology

## 2021-02-16 ENCOUNTER — Ambulatory Visit (INDEPENDENT_AMBULATORY_CARE_PROVIDER_SITE_OTHER): Payer: Medicare Other | Admitting: Ophthalmology

## 2021-02-16 ENCOUNTER — Ambulatory Visit
Admission: RE | Admit: 2021-02-16 | Discharge: 2021-02-16 | Disposition: A | Discharge status: Home / Self Care | Payer: Medicare Other | Source: Ambulatory Visit | Attending: Radiation Oncology | Admitting: Radiation Oncology

## 2021-02-16 DIAGNOSIS — H353221 Exudative age-related macular degeneration, left eye, with active choroidal neovascularization: Secondary | ICD-10-CM | POA: Diagnosis not present

## 2021-02-16 DIAGNOSIS — D0512 Intraductal carcinoma in situ of left breast: Secondary | ICD-10-CM | POA: Diagnosis not present

## 2021-02-16 DIAGNOSIS — H43822 Vitreomacular adhesion, left eye: Secondary | ICD-10-CM

## 2021-02-16 DIAGNOSIS — H353123 Nonexudative age-related macular degeneration, left eye, advanced atrophic without subfoveal involvement: Secondary | ICD-10-CM

## 2021-02-16 DIAGNOSIS — Z510 Encounter for antineoplastic radiation therapy: Secondary | ICD-10-CM | POA: Diagnosis not present

## 2021-02-16 NOTE — Assessment & Plan Note (Signed)
Vitreomacular traction may cause vision loss from anatomic distortion to the center of the vision, the macula.  If visual function is symptomatic or threatened, therapy may be needed.  Surgical intervention offers the highest chance of visual stability and improvement.  Distortion of the macula anatomy may cause splitting of the retinal layers, termed foveomacular retinoschisis, which can cause more permanent vision loss.  Epiretinal membranes may also be associated.  Macular hole may also develop if vitreomacular traction progresses. The minor form of this condition is Vitreomacular adhesion, which is a natural change in the aging process of the eye, which requires observation only. 

## 2021-02-16 NOTE — Assessment & Plan Note (Signed)
The nature of wet macular degeneration was discussed with the patient.  Forms of therapy reviewed include the use of Anti-VEGF medications injected painlessly into the eye, as well as other possible treatment modalities, including thermal laser therapy. Fellow eye involvement and risks were discussed with the patient. Upon the finding of wet age related macular degeneration, treatment will be offered. The treatment regimen is on a treat as needed basis with the intent to treat if necessary and extend interval of exams when possible. On average 1 out of 6 patients do not need lifetime therapy. However, the risk of recurrent disease is high for a lifetime.  Initially monthly, then periodic, examinations and evaluations will determine whether the next treatment is required on the day of the examination.  At 10 week interval

## 2021-02-16 NOTE — Progress Notes (Signed)
02/16/2021     CHIEF COMPLAINT Patient presents for Retina Follow Up (10 week fu OS and Avastin OS/Pt states VA OU stable since last visit. Pt denies FOL, floaters, or ocular pain OU. /)   HISTORY OF PRESENT ILLNESS: Barbara Mccoy is a 87 y.o. female who presents to the clinic today for:   HPI     Retina Follow Up           Diagnosis: Wet AMD   Laterality: left eye   Onset: 10 weeks ago   Severity: mild   Duration: 10 weeks   Course: stable   Comments: 10 week fu OS and Avastin OS Pt states VA OU stable since last visit. Pt denies FOL, floaters, or ocular pain OU.         Last edited by Kendra Opitz, COA on 02/16/2021  2:14 PM.      Referring physician: Burnard Bunting, MD [place],  [place] [address]  HISTORICAL INFORMATION:   Selected notes from the [facility]: No current outpatient medications on file. (Ophthalmic Drugs)   No current facility-administered medications for this visit. (Ophthalmic Drugs)   Current Outpatient Medications (Other)  Medication Sig   apixaban (ELIQUIS) 5 MG TABS tablet TAKE ONE TABLET BY MOUTH TWICE DAILY   cholecalciferol (VITAMIN D3) 25 MCG (1000 UNIT) tablet Take 1,000 Units by mouth daily.   MAGNESIUM CITRATE PO Take 250 mg by mouth daily.   Multiple Minerals-Vitamins (GNP CAL MAG ZINC +D3 PO) Take 1 tablet by mouth in the morning and at bedtime.   Multiple Vitamins-Minerals (PRESERVISION AREDS 2+MULTI VIT PO) Take 1 capsule by mouth in the morning and at bedtime.   simvastatin (ZOCOR) 20 MG tablet Take 20 mg by mouth daily.   No current facility-administered medications for this visit. (Other)      REVIEW OF SYSTEMS:    ALLERGIES Allergies  Allergen Reactions   Codeine Rash    PAST MEDICAL HISTORY Past Medical History:  Diagnosis Date   Cataract    bilateral   Irritable bowel 1950   type 3C   Macular degeneration, wet ([place])    bilateral, receives  injections every 8-12 weeks   Migraine headache 1950   "fewer in old age"   Squamous cell carcinoma of skin of chest 08/05/2020   Past Surgical History:  Procedure Laterality Date   BREAST LUMPECTOMY WITH RADIOACTIVE SEED LOCALIZATION Left 01/02/2021   Procedure: LEFT BREAST LUMPECTOMY WITH RADIOACTIVE SEED LOCALIZATION;  Surgeon: Erroll Luna, MD;  Location: [facility];  Service: General;  Laterality: Left;   CARDIOVERSION N/A 01/30/2021   Procedure: CARDIOVERSION;  Surgeon: Geralynn Rile, MD;  Location: [facility];  Service: Cardiovascular;  Laterality: N/A;   CATARACT EXTRACTION, BILATERAL     INGUINAL HERNIA REPAIR     in 74's   SKIN CANCER EXCISION     TONSILLECTOMY     in childhood   TOTAL ABDOMINAL HYSTERECTOMY     w/ BSO, in 77's    FAMILY HISTORY Family History  Problem Relation Age of Onset   Brain cancer Father     SOCIAL HISTORY Social History   Tobacco Use   Smoking status: Never   Smokeless tobacco: Never  Substance Use Topics   Alcohol use: Yes    Alcohol/week: 5.0 standard drinks    Types: 4 Glasses of wine, 1 Shots of liquor per week   Drug use:  Never         OPHTHALMIC EXAM:  Base Eye Exam     Visual Acuity (ETDRS)       Right Left   Dist cc 20/20 -2 20/25 -2         Tonometry (Tonopen, 2:17 PM)       Right Left   Pressure 12 14         Pupils       Pupils Dark Light Shape React APD   Right PERRL 4 3 Round Brisk None   Left PERRL 4 3 Round Brisk None         Visual Fields (Counting fingers)       Left Right    Full Full         Extraocular Movement       Right Left    Full Full         Neuro/Psych     Oriented x3: Yes   Mood/Affect: Normal         Dilation     Left eye: 1.0% Mydriacyl, 2.5% Phenylephrine @ 2:17 PM           Slit Lamp and Fundus Exam     External Exam       Right Left   External Normal Normal         Slit Lamp Exam       Right Left    Lids/Lashes Normal Normal   Conjunctiva/Sclera White and quiet White and quiet   Cornea Clear Clear   Anterior Chamber Deep and quiet Deep and quiet   Iris Round and reactive Round and reactive   Lens Posterior chamber intraocular lens, Open posterior capsule Posterior chamber intraocular lens   Anterior Vitreous Normal Normal         Fundus Exam       Right Left   Posterior Vitreous  Normal   Disc  Normal   C/D Ratio  0.4   Macula  Atrophy, Retinal pigment epithelial atrophy, Age related macular degeneration, Early age related macular degeneration, Drusen, Soft drusen, no hemorrhage   Vessels  Normal   Periphery  Normal            IMAGING AND PROCEDURES  Imaging and Procedures for 02/16/21  OCT, Retina - OU - Both Eyes       Right Eye Quality was good. Scan locations included subfoveal. Central Foveal Thickness: 240. Progression has been stable. Findings include abnormal foveal contour, vitreomacular adhesion .   Left Eye Quality was good. Scan locations included subfoveal. Central Foveal Thickness: 227. Progression has improved. Findings include abnormal foveal contour, vitreomacular adhesion .              ASSESSMENT/PLAN:  Exudative age-related macular degeneration of left eye with active choroidal neovascularization (HCC) The nature of wet macular degeneration was discussed with the patient.  Forms of therapy reviewed include the use of Anti-VEGF medications injected painlessly into the eye, as well as other possible treatment modalities, including thermal laser therapy. Fellow eye involvement and risks were discussed with the patient. Upon the finding of wet age related macular degeneration, treatment will be offered. The treatment regimen is on a treat as needed basis with the intent to treat if necessary and extend interval of exams when possible. On average 1 out of 6 patients do not need lifetime therapy. However, the risk of recurrent disease is high for a  lifetime.  Initially monthly, then periodic, examinations and  evaluations will determine whether the next treatment is required on the day of the examination.  At 10 week interval  Vitreomacular adhesion of left eye Vitreomacular traction may cause vision loss from anatomic distortion to the center of the vision, the macula.  If visual function is symptomatic or threatened, therapy may be needed.  Surgical intervention offers the highest chance of visual stability and improvement.  Distortion of the macula anatomy may cause splitting of the retinal layers, termed foveomacular retinoschisis, which can cause more permanent vision loss.  Epiretinal membranes may also be associated.  Macular hole may also develop if vitreomacular traction progresses. The minor form of this condition is Vitreomacular adhesion, which is a natural change in the aging process of the eye, which requires observation only.     ICD-10-CM   1. Advanced nonexudative age-related macular degeneration of left eye without subfoveal involvement  H35.3123 OCT, Retina - OU - Both Eyes    2. Exudative age-related macular degeneration of left eye with active choroidal neovascularization ([place])  H35.3221     3. Vitreomacular adhesion of left eye  H43.822       1.  OS no sign of recurrence of CNVM.  Currently at 10-week follow-up interval.  We will plan repeat injection OS today to maintain and follow-up next in 12 weeks  2.  OD at 3 weeks post most recent evaluation and injection antivegF.  Plan follow-up is 4 months from the time of original last injection  3.  Ophthalmic Meds Ordered this visit:  No orders of the defined types were placed in this encounter.      Return in about 12 weeks (around 05/11/2021) for DILATE OU, COLOR FP, OCT.  There are no Patient Instructions on file for this visit.   Explained the diagnoses, plan, and follow up with the patient and they expressed understanding.  Patient expressed understanding  of the importance of proper follow up care.   Clent Demark Khiara Shuping M.D. Diseases & Surgery of the Retina and Vitreous [facility]ina [facility]abetic [place] 02/16/21     Abbreviations: M myopia (nearsighted); A astigmatism; H hyperopia (farsighted); P presbyopia; Mrx spectacle prescription;  CTL contact lenses; OD right eye; OS left eye; OU both eyes  XT exotropia; ET esotropia; PEK punctate epithelial keratitis; PEE punctate epithelial erosions; DES dry eye syndrome; MGD meibomian gland dysfunction; ATs artificial tears; PFAT's preservative free artificial tears; [place] nuclear sclerotic cataract; PSC posterior subcapsular cataract; ERM epi-retinal membrane; PVD posterior vitreous detachment; RD retinal detachment; DM diabetes mellitus; DR diabetic retinopathy; NPDR non-proliferative diabetic retinopathy; PDR proliferative diabetic retinopathy; CSME clinically significant macular edema; DME diabetic macular edema; dbh dot blot hemorrhages; CWS cotton wool spot; POAG primary open angle glaucoma; C/D cup-to-disc ratio; HVF humphrey visual field; GVF goldmann visual field; OCT optical coherence tomography; IOP intraocular pressure; BRVO Branch retinal vein occlusion; CRVO central retinal vein occlusion; CRAO central retinal artery occlusion; BRAO branch retinal artery occlusion; RT retinal tear; SB scleral buckle; PPV pars plana vitrectomy; VH Vitreous hemorrhage; PRP panretinal laser photocoagulation; IVK intravitreal kenalog; VMT vitreomacular traction; MH Macular hole;  NVD neovascularization of the disc; NVE neovascularization elsewhere; AREDS age related eye disease study; ARMD age related macular degeneration; POAG primary open angle glaucoma; EBMD epithelial/anterior basement membrane dystrophy; ACIOL anterior chamber intraocular lens; IOL intraocular lens; PCIOL posterior chamber intraocular lens; Phaco/IOL phacoemulsification with intraocular lens placement; [place] photorefractive keratectomy; LASIK laser assisted in  situ keratomileusis; HTN hypertension; DM diabetes mellitus; COPD chronic obstructive pulmonary disease

## 2021-02-17 ENCOUNTER — Ambulatory Visit
Admission: RE | Admit: 2021-02-17 | Discharge: 2021-02-17 | Disposition: A | Discharge status: Home / Self Care | Payer: Medicare Other | Source: Ambulatory Visit | Attending: Radiation Oncology | Admitting: Radiation Oncology

## 2021-02-17 DIAGNOSIS — D0512 Intraductal carcinoma in situ of left breast: Secondary | ICD-10-CM | POA: Diagnosis not present

## 2021-02-17 DIAGNOSIS — Z510 Encounter for antineoplastic radiation therapy: Secondary | ICD-10-CM | POA: Diagnosis not present

## 2021-02-18 ENCOUNTER — Other Ambulatory Visit: Payer: Self-pay

## 2021-02-18 ENCOUNTER — Ambulatory Visit
Admission: RE | Admit: 2021-02-18 | Discharge: 2021-02-18 | Disposition: A | Discharge status: Home / Self Care | Payer: Medicare Other | Source: Ambulatory Visit | Attending: Radiation Oncology | Admitting: Radiation Oncology

## 2021-02-18 DIAGNOSIS — D0512 Intraductal carcinoma in situ of left breast: Secondary | ICD-10-CM | POA: Diagnosis not present

## 2021-02-18 DIAGNOSIS — Z510 Encounter for antineoplastic radiation therapy: Secondary | ICD-10-CM | POA: Diagnosis not present

## 2021-02-19 ENCOUNTER — Ambulatory Visit
Admission: RE | Admit: 2021-02-19 | Discharge: 2021-02-19 | Disposition: A | Discharge status: Home / Self Care | Payer: Medicare Other | Source: Ambulatory Visit | Attending: Radiation Oncology | Admitting: Radiation Oncology

## 2021-02-19 DIAGNOSIS — D0512 Intraductal carcinoma in situ of left breast: Secondary | ICD-10-CM | POA: Diagnosis not present

## 2021-02-19 DIAGNOSIS — Z510 Encounter for antineoplastic radiation therapy: Secondary | ICD-10-CM | POA: Diagnosis not present

## 2021-02-20 ENCOUNTER — Other Ambulatory Visit: Payer: Self-pay

## 2021-02-20 ENCOUNTER — Ambulatory Visit
Admission: RE | Admit: 2021-02-20 | Discharge: 2021-02-20 | Disposition: A | Discharge status: Home / Self Care | Payer: Medicare Other | Source: Ambulatory Visit | Attending: Radiation Oncology | Admitting: Radiation Oncology

## 2021-02-20 DIAGNOSIS — D0512 Intraductal carcinoma in situ of left breast: Secondary | ICD-10-CM | POA: Diagnosis not present

## 2021-02-20 DIAGNOSIS — Z510 Encounter for antineoplastic radiation therapy: Secondary | ICD-10-CM | POA: Diagnosis not present

## 2021-02-23 ENCOUNTER — Other Ambulatory Visit: Payer: Self-pay

## 2021-02-23 ENCOUNTER — Ambulatory Visit
Admission: RE | Admit: 2021-02-23 | Discharge: 2021-02-23 | Disposition: A | Discharge status: Home / Self Care | Payer: Medicare Other | Source: Ambulatory Visit | Attending: Radiation Oncology | Admitting: Radiation Oncology

## 2021-02-23 DIAGNOSIS — D0512 Intraductal carcinoma in situ of left breast: Secondary | ICD-10-CM

## 2021-02-23 DIAGNOSIS — Z510 Encounter for antineoplastic radiation therapy: Secondary | ICD-10-CM | POA: Diagnosis not present

## 2021-02-23 MED ORDER — ALRA NON-METALLIC DEODORANT (RAD-ONC)
1.0000 "application " | Freq: Once | TOPICAL | Status: AC
  Administered 2021-02-23: 1 via TOPICAL

## 2021-02-23 MED ORDER — RADIAPLEXRX EX GEL: Freq: Once | CUTANEOUS | Status: AC

## 2021-02-23 NOTE — Progress Notes (Signed)

## 2021-02-24 ENCOUNTER — Ambulatory Visit
Admission: RE | Admit: 2021-02-24 | Discharge: 2021-02-24 | Disposition: A | Discharge status: Home / Self Care | Payer: Medicare Other | Source: Ambulatory Visit | Attending: Radiation Oncology | Admitting: Radiation Oncology

## 2021-02-24 DIAGNOSIS — Z510 Encounter for antineoplastic radiation therapy: Secondary | ICD-10-CM | POA: Diagnosis not present

## 2021-02-24 DIAGNOSIS — D0512 Intraductal carcinoma in situ of left breast: Secondary | ICD-10-CM | POA: Diagnosis not present

## 2021-02-25 ENCOUNTER — Ambulatory Visit: Payer: Medicare Other

## 2021-02-26 ENCOUNTER — Other Ambulatory Visit: Payer: Self-pay

## 2021-02-26 ENCOUNTER — Ambulatory Visit
Admission: RE | Admit: 2021-02-26 | Discharge: 2021-02-26 | Disposition: A | Discharge status: Home / Self Care | Payer: Medicare Other | Source: Ambulatory Visit | Attending: Radiation Oncology | Admitting: Radiation Oncology

## 2021-02-26 DIAGNOSIS — D0512 Intraductal carcinoma in situ of left breast: Secondary | ICD-10-CM | POA: Diagnosis not present

## 2021-02-26 DIAGNOSIS — Z510 Encounter for antineoplastic radiation therapy: Secondary | ICD-10-CM | POA: Diagnosis not present

## 2021-02-27 ENCOUNTER — Ambulatory Visit
Admission: RE | Admit: 2021-02-27 | Discharge: 2021-02-27 | Disposition: A | Discharge status: Home / Self Care | Payer: Medicare Other | Source: Ambulatory Visit | Attending: Radiation Oncology | Admitting: Radiation Oncology

## 2021-02-27 DIAGNOSIS — D0512 Intraductal carcinoma in situ of left breast: Secondary | ICD-10-CM | POA: Diagnosis not present

## 2021-02-27 DIAGNOSIS — Z510 Encounter for antineoplastic radiation therapy: Secondary | ICD-10-CM | POA: Diagnosis not present

## 2021-03-01 NOTE — Progress Notes (Signed)
Cardiology Office Note:    Date:  03/03/2021   ID:  EVLYN EDRIS, DOB [birth year], MRN [id number]  PCP:  Burnard Bunting, MD  Cardiologist:  None  Electrophysiologist:  None   Referring MD: Burnard Bunting, MD   Chief Complaint  Patient presents with   Atrial Fibrillation      History of Present Illness:    Barbara Mccoy is a 87 y.o. female with a hx of breast cancer, recently diagnosed atrial fibrillation who presents for follow-up.  On 01/02/2021 she presented for lumpectomy and found to be in atrial fibrillation.  She was asymptomatic.  Underwent successful surgery.   She denies any history of A. fib but reports she saw a cardiologist years ago for palpitations and was started on atenolol at that time.  Has been off atenolol for years. Since then, and until her lumpectomy, she had no indication of Afib. Lately, she has occasional palpitations that she describes as "inconsequential" and lasts for a few seconds. She is unsure of how frequent they are, as they cause her no distress and are difficult to detect. The past few days she has felt more fatigued, and notes she has been taking 6 exercise classes a week (1 per day). During her surgery, they performed a lumpectomy only and will treat with radiation therapy in July. She has never been a smoker. Her father had a TIA in his 5's. She denies any chest pain, shortness of breath, or exertional symptoms. No headaches, lightheadedness, or syncope to report. Also has no lower extremity edema, orthopnea or PND. She denies any history of hematuria or blood in her stool, and no bleeding from her surgical site.  Echocardiogram on 01/13/2021 showed normal biventricular function, no significant valvular disease.  Underwent successful cardioversion on 01/30/2021.  Since last clinic visit, he reports that she is doing well.  Reports she checked her heart rhythm with her friend's apple watch shortly after cardioversion and was back in atrial fibrillation.   She was asymptomatic.  Denies any chest pain, dyspnea, syncope, lower extremity edema, or palpitations.  Reports occasional lightheadedness.    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, receives injections every 8-12 weeks   Migraine headache 1950   "fewer in old age"   Squamous cell carcinoma of skin of chest 08/05/2020    Past Surgical History:  Procedure Laterality Date   BREAST LUMPECTOMY WITH RADIOACTIVE SEED LOCALIZATION Left 01/02/2021   Procedure: LEFT BREAST LUMPECTOMY WITH RADIOACTIVE SEED LOCALIZATION;  Surgeon: Erroll Luna, MD;  Location: [facility];  Service: General;  Laterality: Left;   CARDIOVERSION N/A 01/30/2021   Procedure: CARDIOVERSION;  Surgeon: Geralynn Rile, MD;  Location: [facility];  Service: Cardiovascular;  Laterality: N/A;   CATARACT EXTRACTION, BILATERAL     INGUINAL HERNIA REPAIR     in 40's   SKIN CANCER EXCISION     TONSILLECTOMY     in childhood   TOTAL ABDOMINAL HYSTERECTOMY     w/ BSO, in 40's    Current Medications: Current Meds  Medication Sig   apixaban (ELIQUIS) 5 MG TABS tablet TAKE ONE TABLET BY MOUTH TWICE DAILY   cholecalciferol (VITAMIN D3) 25 MCG (1000 UNIT) tablet Take 1,000 Units  by mouth daily.   MAGNESIUM CITRATE PO Take 250 mg by mouth daily.   Multiple Minerals-Vitamins (GNP CAL MAG ZINC +D3 PO) Take 1 tablet by mouth in the morning and at bedtime.   Multiple Vitamins-Minerals (PRESERVISION AREDS 2+MULTI VIT PO) Take 1 capsule by mouth in the morning and at bedtime.   simvastatin (ZOCOR) 20 MG tablet Take 20 mg by mouth daily.     Allergies:   Codeine   Social History   Socioeconomic History   Marital status: Widowed    Spouse name: Not on file   Number of children: Not on file    Years of education: Not on file   Highest education level: Not on file  Occupational History   Not on file  Tobacco Use   Smoking status: Never   Smokeless tobacco: Never  Substance and Sexual Activity   Alcohol use: Yes    Alcohol/week: 5.0 standard drinks    Types: 4 Glasses of wine, 1 Shots of liquor per week   Drug use: Never   Sexual activity: Not on file  Other Topics Concern   Not on file  Social History Narrative   Not on file   Social Determinants of Health   Financial Resource Strain: Low Risk    Difficulty of Paying Living Expenses: Not hard at all  Food Insecurity: No Food Insecurity   Worried About Charity fundraiser in the Last Year: Never true   [place] in the Last Year: Never true  Transportation Needs: No Transportation Needs   Lack of Transportation (Medical): No   Lack of Transportation (Non-Medical): No  Physical Activity: Not on file  Stress: Not on file  Social Connections: Not on file     Family History: The patient's family history includes Brain cancer in her father.  ROS:   Please see the history of present illness.    (+) Central chest pressure/tightness (+) Palpitations (+) Fatigue (+) Dry cough All other systems reviewed and are negative.  EKGs/Labs/Other Studies Reviewed:    The following studies were reviewed today:  Echo 01/13/2021:  1. Left ventricular ejection fraction, by estimation, is 60 to 65%. The  left ventricle has normal function. The left ventricle has no regional  wall motion abnormalities. There is mild left ventricular hypertrophy.  Left ventricular diastolic function  could not be evaluated.   2. Right ventricular systolic function is low normal. The right  ventricular size is normal. There is normal pulmonary artery systolic  pressure. The estimated right ventricular systolic pressure is [id number] mmHg.   3. Right atrial size was top normal.   4. The mitral valve is abnormal. Trivial mitral valve  regurgitation.   5. The aortic valve is tricuspid. Aortic valve regurgitation is not  visualized.   6. The inferior vena cava is dilated in size with >50% respiratory  variability, suggesting right atrial pressure of 8 mmHg.   Comparison(s): No prior Echocardiogram.  US Carotid Duplex 04/01/2020: Right Carotid: Velocities in the right ICA are consistent with a 1-39%  stenosis.   Left Carotid: Velocities in the left ICA are consistent with a 1-39%  stenosis.   Vertebrals:  Bilateral vertebral arteries demonstrate antegrade flow.  Subclavians: Normal flow hemodynamics were seen in bilateral subclavian arteries.   EKG:   03/03/21: Afib, low voltage, rate 74, Q waves in V1/2 01/23/2021: Atrial flutter with variable conduction, rate 65, Q waves in V1/2 01/05/2021: Atrial flutter with variable conduction, rate 83, Q waves  in V1/2  Recent Labs: 12/26/2020: ALT 23 01/23/2021: BUN 12; Creatinine, Ser 0.69; Hemoglobin 16.0; Platelets 195; Potassium 4.5; Sodium 139  Recent Lipid Panel No results found for: CHOL, TRIG, HDL, CHOLHDL, VLDL, LDLCALC, LDLDIRECT  Physical Exam:    VS:  BP (!) 150/90 (BP Location: Left Arm)   Pulse 74   Ht '5\' 5"'$  (1.651 m)   Wt 167 lb 12.8 oz (76.1 kg)   LMP  (LMP Unknown)   SpO2 96%   BMI 27.92 kg/m     Wt Readings from Last 3 Encounters:  03/03/21 167 lb 12.8 oz (76.1 kg)  02/04/21 165 lb (74.8 kg)  01/30/21 166 lb (75.3 kg)     GEN: Well nourished, well developed in no acute distress HEENT: Normal NECK: No JVD; No carotid bruits LYMPHATICS: No lymphadenopathy CARDIAC: Irregular rhythm,normal rate, no murmurs, rubs, gallops RESPIRATORY:  Clear to auscultation without rales, wheezing or rhonchi  ABDOMEN: Soft, non-tender, non-distended MUSCULOSKELETAL:  No edema; No deformity  SKIN: Warm and dry NEUROLOGIC:  Alert and oriented x 3 PSYCHIATRIC:  Normal affect   ASSESSMENT:    1. Atrial fibrillation, unspecified type ([place])   2. Hyperlipidemia,  unspecified hyperlipidemia type      PLAN:    Atrial fibrillation/flutter: New diagnosis 12/2020.  She is asymptomatic.  CHA2DS2-VASc score 3 (age x2, female).  Echocardiogram on 01/13/2021 showed normal biventricular function, no significant valvular disease.  Underwent successful cardioversion on 01/30/2021 but now back in Afib -Given she is rate controlled and asymptomatic, recommend rate control strategy.  She is agreeable to this -Zio patch x3 days showed low rates (average rate 64) and pauses up to 3 seconds.  She felt poorly since starting metoprolol, suspect this is due to bradycardia.  Metoprolol discontinued.  Rates appear well controlled off of metoprolol -Continue Eliquis 5 mg twice daily.    Hyperlipidemia: On simvastatin 20 mg daily  RTC in 6 months  Medication Adjustments/Labs and Tests Ordered: Current medicines are reviewed at length with the patient today.  Concerns regarding medicines are outlined above.  Orders Placed This Encounter  Procedures   EKG 12-Lead   ECHOCARDIOGRAM COMPLETE     No orders of the defined types were placed in this encounter.   Patient Instructions  Medication Instructions:  No Changes In Medications at this time.  *If you need a refill on your cardiac medications before your next appointment, please call your pharmacy*  Testing/Procedures: Your physician has requested that you have an echocardiogram IN 6 MONTHS . Echocardiography is a painless test that uses sound waves to create images of your heart. It provides your doctor with information about the size and shape of your heart and how well your heart's chambers and valves are working. You may receive an ultrasound enhancing agent through an IV if needed to better visualize your heart during the echo.This procedure takes approximately one hour. There are no restrictions for this procedure. This will take place at the [address], [address].   Follow-Up: At [facility], you and  your health needs are our priority.  As part of our continuing mission to provide you with exceptional heart care, we have created designated Provider Care Teams.  These Care Teams include your primary Cardiologist (physician) and Advanced Practice Providers (APPs -  Physician Assistants and Nurse Practitioners) who all work together to provide you with the care you need, when you need it.  Your next appointment:   6 month(s) RIGHT AFTER ECHO  The format for your next appointment:   In Person  Provider:   Oswaldo Milian, MD    Signed, Donato Heinz, MD  03/03/2021 3:54 PM    [place]

## 2021-03-02 ENCOUNTER — Ambulatory Visit
Admission: RE | Admit: 2021-03-02 | Discharge: 2021-03-02 | Disposition: A | Discharge status: Home / Self Care | Payer: Medicare Other | Source: Ambulatory Visit | Attending: Radiation Oncology | Admitting: Radiation Oncology

## 2021-03-02 ENCOUNTER — Other Ambulatory Visit: Payer: Self-pay

## 2021-03-02 DIAGNOSIS — D0512 Intraductal carcinoma in situ of left breast: Secondary | ICD-10-CM | POA: Insufficient documentation

## 2021-03-02 DIAGNOSIS — Z170 Estrogen receptor positive status [ER+]: Secondary | ICD-10-CM | POA: Insufficient documentation

## 2021-03-02 DIAGNOSIS — Z510 Encounter for antineoplastic radiation therapy: Secondary | ICD-10-CM | POA: Insufficient documentation

## 2021-03-03 ENCOUNTER — Encounter: Payer: Self-pay | Admitting: Cardiology

## 2021-03-03 ENCOUNTER — Ambulatory Visit (INDEPENDENT_AMBULATORY_CARE_PROVIDER_SITE_OTHER): Payer: Medicare Other | Admitting: Cardiology

## 2021-03-03 ENCOUNTER — Ambulatory Visit
Admission: RE | Admit: 2021-03-03 | Discharge: 2021-03-03 | Disposition: A | Discharge status: Home / Self Care | Payer: Medicare Other | Source: Ambulatory Visit | Attending: Radiation Oncology | Admitting: Radiation Oncology

## 2021-03-03 VITALS — BP 150/90 | HR 74 | Ht 65.0 in | Wt 167.8 lb

## 2021-03-03 DIAGNOSIS — D0512 Intraductal carcinoma in situ of left breast: Secondary | ICD-10-CM | POA: Diagnosis not present

## 2021-03-03 DIAGNOSIS — I4891 Unspecified atrial fibrillation: Secondary | ICD-10-CM | POA: Diagnosis not present

## 2021-03-03 DIAGNOSIS — E785 Hyperlipidemia, unspecified: Secondary | ICD-10-CM

## 2021-03-03 DIAGNOSIS — Z510 Encounter for antineoplastic radiation therapy: Secondary | ICD-10-CM | POA: Diagnosis not present

## 2021-03-03 DIAGNOSIS — Z170 Estrogen receptor positive status [ER+]: Secondary | ICD-10-CM | POA: Diagnosis not present

## 2021-03-03 NOTE — Patient Instructions (Signed)
Medication Instructions:  No Changes In Medications at this time.  *If you need a refill on your cardiac medications before your next appointment, please call your pharmacy*  Testing/Procedures: Your physician has requested that you have an echocardiogram IN 6 MONTHS . Echocardiography is a painless test that uses sound waves to create images of your heart. It provides your doctor with information about the size and shape of your heart and how well your heart's chambers and valves are working. You may receive an ultrasound enhancing agent through an IV if needed to better visualize your heart during the echo.This procedure takes approximately one hour. There are no restrictions for this procedure. This will take place at the [address], [address].   Follow-Up: At [facility], you and your health needs are our priority.  As part of our continuing mission to provide you with exceptional heart care, we have created designated Provider Care Teams.  These Care Teams include your primary Cardiologist (physician) and Advanced Practice Providers (APPs -  Physician Assistants and Nurse Practitioners) who all work together to provide you with the care you need, when you need it.  Your next appointment:   6 month(s) RIGHT AFTER ECHO   The format for your next appointment:   In Person  Provider:   Oswaldo Milian, MD

## 2021-03-04 ENCOUNTER — Ambulatory Visit
Admission: RE | Admit: 2021-03-04 | Discharge: 2021-03-04 | Disposition: A | Discharge status: Home / Self Care | Payer: Medicare Other | Source: Ambulatory Visit | Attending: Radiation Oncology | Admitting: Radiation Oncology

## 2021-03-04 ENCOUNTER — Encounter: Payer: Self-pay | Admitting: *Deleted

## 2021-03-04 ENCOUNTER — Other Ambulatory Visit: Payer: Self-pay

## 2021-03-04 DIAGNOSIS — D0512 Intraductal carcinoma in situ of left breast: Secondary | ICD-10-CM

## 2021-03-04 DIAGNOSIS — Z170 Estrogen receptor positive status [ER+]: Secondary | ICD-10-CM | POA: Diagnosis not present

## 2021-03-04 DIAGNOSIS — Z510 Encounter for antineoplastic radiation therapy: Secondary | ICD-10-CM | POA: Diagnosis not present

## 2021-03-05 ENCOUNTER — Ambulatory Visit: Payer: Medicare Other

## 2021-03-05 ENCOUNTER — Ambulatory Visit
Admission: RE | Admit: 2021-03-05 | Discharge: 2021-03-05 | Disposition: A | Discharge status: Home / Self Care | Payer: Medicare Other | Source: Ambulatory Visit | Attending: Radiation Oncology | Admitting: Radiation Oncology

## 2021-03-05 DIAGNOSIS — Z510 Encounter for antineoplastic radiation therapy: Secondary | ICD-10-CM | POA: Diagnosis not present

## 2021-03-05 DIAGNOSIS — D0512 Intraductal carcinoma in situ of left breast: Secondary | ICD-10-CM | POA: Diagnosis not present

## 2021-03-05 DIAGNOSIS — Z170 Estrogen receptor positive status [ER+]: Secondary | ICD-10-CM | POA: Diagnosis not present

## 2021-03-06 ENCOUNTER — Other Ambulatory Visit: Payer: Self-pay

## 2021-03-06 ENCOUNTER — Ambulatory Visit
Admission: RE | Admit: 2021-03-06 | Discharge: 2021-03-06 | Disposition: A | Discharge status: Home / Self Care | Payer: Medicare Other | Source: Ambulatory Visit | Attending: Radiation Oncology | Admitting: Radiation Oncology

## 2021-03-06 ENCOUNTER — Encounter: Payer: Self-pay | Admitting: Radiation Oncology

## 2021-03-06 DIAGNOSIS — Z170 Estrogen receptor positive status [ER+]: Secondary | ICD-10-CM | POA: Diagnosis not present

## 2021-03-06 DIAGNOSIS — D0512 Intraductal carcinoma in situ of left breast: Secondary | ICD-10-CM | POA: Diagnosis not present

## 2021-03-06 DIAGNOSIS — Z510 Encounter for antineoplastic radiation therapy: Secondary | ICD-10-CM | POA: Diagnosis not present

## 2021-03-10 DIAGNOSIS — H52203 Unspecified astigmatism, bilateral: Secondary | ICD-10-CM | POA: Diagnosis not present

## 2021-03-10 DIAGNOSIS — Z961 Presence of intraocular lens: Secondary | ICD-10-CM | POA: Diagnosis not present

## 2021-04-07 ENCOUNTER — Encounter: Payer: Self-pay | Admitting: Radiation Oncology

## 2021-04-07 NOTE — Progress Notes (Signed)
I called the patient today about her upcoming follow-up appointment in radiation oncology.   Given the state of the COVID-19 pandemic, concerning case numbers in our community, and guidance from [facility], I offered a phone assessment with the patient to determine if coming to the clinic was necessary. She accepted.  The patient denies any symptomatic concerns.  Specifically, they report good healing of their skin in the radiation fields.  Skin is intact.  Patient reports skin is healing well. No skin issues.   I recommended that she continue skin care by applying oil or lotion with vitamin E to the skin in the radiation fields, BID, for 2 more months.  Continue follow-up with medical oncology - follow-up is scheduled on 05/27/21. I explained that yearly mammograms are important for patients with intact breast tissue, and physical exams are important after mastectomy for patients that cannot undergo mammography.  I encouraged her to call if she had further questions or concerns about her healing. Otherwise, she will follow-up PRN in radiation oncology. Patient is pleased with this plan, and we will cancel her upcoming follow-up to reduce the risk of COVID-19 transmission.

## 2021-04-08 ENCOUNTER — Ambulatory Visit
Admission: RE | Admit: 2021-04-08 | Discharge: 2021-04-08 | Disposition: A | Discharge status: Home / Self Care | Payer: Medicare Other | Source: Ambulatory Visit | Attending: Radiation Oncology | Admitting: Radiation Oncology

## 2021-04-10 NOTE — Progress Notes (Signed)
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)  Cancer Staging: Cancer Staging Ductal carcinoma in situ (DCIS) of left breast Staging form: Breast, AJCC 8th Edition - Clinical stage from 12/24/2020: Stage 0 (cTis (DCIS), cN0, cM0, ER+, PR+, HER2: Not Assessed) - Unsigned Stage prefix: Initial diagnosis Nuclear grade: G3 Laterality: Left Staged by: Pathologist and managing physician Stage used in treatment planning: Yes National guidelines used in treatment planning: Yes Type of national guideline used in treatment planning: NCCN   Indication for Treatment: curative  Radiation Treatment Dates: 02/12/2021 - 03/06/2021  Course: C1_Breast  Plan ID Fractions First Treatment Last Treatment  Dose  Breast_Lt_BH 16 / 16 02/12/2021 03/06/2021 42.56Gy/ 42.56Gy   Technique: 3D  Narrative: The patient tolerated radiation therapy relatively well.   Plan: The patient has completed radiation treatment. The patient will return to radiation oncology clinic for routine follow-up in one month, or as needed. -----------------------------------  Eppie Gibson, MD

## 2021-04-24 ENCOUNTER — Telehealth: Payer: Self-pay | Admitting: Adult Health

## 2021-04-24 NOTE — Telephone Encounter (Signed)
LMOM for patient to return my call about getting set up for S[facility] APPT

## 2021-04-27 ENCOUNTER — Ambulatory Visit (INDEPENDENT_AMBULATORY_CARE_PROVIDER_SITE_OTHER): Payer: Medicare Other | Admitting: Ophthalmology

## 2021-04-27 ENCOUNTER — Encounter (INDEPENDENT_AMBULATORY_CARE_PROVIDER_SITE_OTHER): Payer: Self-pay | Admitting: Ophthalmology

## 2021-04-27 ENCOUNTER — Other Ambulatory Visit: Payer: Self-pay

## 2021-04-27 DIAGNOSIS — H353211 Exudative age-related macular degeneration, right eye, with active choroidal neovascularization: Secondary | ICD-10-CM | POA: Diagnosis not present

## 2021-04-27 DIAGNOSIS — H353123 Nonexudative age-related macular degeneration, left eye, advanced atrophic without subfoveal involvement: Secondary | ICD-10-CM

## 2021-04-27 DIAGNOSIS — H43821 Vitreomacular adhesion, right eye: Secondary | ICD-10-CM | POA: Diagnosis not present

## 2021-04-27 DIAGNOSIS — H353221 Exudative age-related macular degeneration, left eye, with active choroidal neovascularization: Secondary | ICD-10-CM

## 2021-04-27 NOTE — Progress Notes (Signed)
04/27/2021     CHIEF COMPLAINT Patient presents for  Chief Complaint  Patient presents with   Retina Follow Up    10 week fu OS and Avastin OS Pt states VA OU stable since last visit. Pt denies FOL, floaters, or ocular pain OU.        HISTORY OF PRESENT ILLNESS: Barbara Mccoy is a 87 y.o. female who presents to the clinic today for:   HPI     Retina Follow Up   Patient presents with  Wet AMD.  In right eye.  This started 13 weeks ago.  Severity is mild.  Duration of 13 weeks.  Since onset it is stable. Additional comments: 10 week fu OS and Avastin OS Pt states VA OU stable since last visit. Pt denies FOL, floaters, or ocular pain OU.          Comments   13 weeks fu od oct. Patient states vision is stable and unchanged since last visit. Denies any new floaters or FOL. Pt states her floaters are stable and come and go randomly. Pt states in June she was diagnosed AFIB- has been put on Eliquis. and had radiation for 3 weeks after that for Breast Cancer.  Pt takes AREDS twice a day.      Last edited by Laurin Coder on 04/27/2021  1:12 PM.      Referring physician: Burnard Bunting, MD [address] [place],  [place] [address]  HISTORICAL INFORMATION:   Selected notes from the [facility]: No current outpatient medications on file. (Ophthalmic Drugs)   No current facility-administered medications for this visit. (Ophthalmic Drugs)   Current Outpatient Medications (Other)  Medication Sig   apixaban (ELIQUIS) 5 MG TABS tablet TAKE ONE TABLET BY MOUTH TWICE DAILY   cholecalciferol (VITAMIN D3) 25 MCG (1000 UNIT) tablet Take 1,000 Units by mouth daily.   MAGNESIUM CITRATE PO Take 250 mg by mouth daily.   Multiple Minerals-Vitamins (GNP CAL MAG ZINC +D3 PO) Take 1 tablet by mouth in the morning and at bedtime.   Multiple Vitamins-Minerals (PRESERVISION AREDS 2+MULTI VIT PO) Take 1 capsule by mouth in the morning and at  bedtime.   simvastatin (ZOCOR) 20 MG tablet Take 20 mg by mouth daily.   No current facility-administered medications for this visit. (Other)      REVIEW OF SYSTEMS:    ALLERGIES Allergies  Allergen Reactions   Codeine Rash    PAST MEDICAL HISTORY Past Medical History:  Diagnosis Date   Cataract    bilateral   Irritable bowel 1950   type 3C   Macular degeneration, wet ([place])    bilateral, receives injections every 8-12 weeks   Migraine headache 1950   "fewer in old age"   Squamous cell carcinoma of skin of chest 08/05/2020   Past Surgical History:  Procedure Laterality Date   BREAST LUMPECTOMY WITH RADIOACTIVE SEED LOCALIZATION Left 01/02/2021   Procedure: LEFT BREAST LUMPECTOMY WITH RADIOACTIVE SEED LOCALIZATION;  Surgeon: Erroll Luna, MD;  Location: [facility];  Service: General;  Laterality: Left;   CARDIOVERSION N/A 01/30/2021   Procedure: CARDIOVERSION;  Surgeon: Geralynn Rile, MD;  Location: [facility];  Service: Cardiovascular;  Laterality: N/A;   CATARACT EXTRACTION, BILATERAL     INGUINAL HERNIA REPAIR     in 40's   SKIN CANCER EXCISION     TONSILLECTOMY     in childhood   TOTAL ABDOMINAL  HYSTERECTOMY     w/ BSO, in 3's    FAMILY HISTORY Family History  Problem Relation Age of Onset   Brain cancer Father     SOCIAL HISTORY Social History   Tobacco Use   Smoking status: Never   Smokeless tobacco: Never  Substance Use Topics   Alcohol use: Yes    Alcohol/week: 5.0 standard drinks    Types: 4 Glasses of wine, 1 Shots of liquor per week   Drug use: Never         OPHTHALMIC EXAM:  Base Eye Exam     Visual Acuity (ETDRS)       Right Left   Dist cc 20/20 -1 20/25    Correction: Glasses         Tonometry (Tonopen, 1:07 PM)       Right Left   Pressure 15 19         Pupils       Pupils Dark Light APD   Right PERRL 4 3 None   Left PERRL 4 3 None         Extraocular Movement       Right Left     Full Full         Neuro/Psych     Oriented x3: Yes   Mood/Affect: Normal         Dilation     Both eyes: 1.0% Mydriacyl, 2.5% Phenylephrine @ 1:07 PM           Slit Lamp and Fundus Exam     External Exam       Right Left   External Normal Normal         Slit Lamp Exam       Right Left   Lids/Lashes Normal Normal   Conjunctiva/Sclera White and quiet White and quiet   Cornea Clear Clear   Anterior Chamber Deep and quiet Deep and quiet   Iris Round and reactive Round and reactive   Lens Posterior chamber intraocular lens, Open posterior capsule Posterior chamber intraocular lens   Anterior Vitreous Normal Normal         Fundus Exam       Right Left   Posterior Vitreous Posterior vitreous detachment partial    Disc Normal    C/D Ratio 0.4    Macula Retinal pigment epithelial mottling, no exudates, Hard drusen, , Retinal pigment epithelial atrophy, Early age related macular degeneration, Soft drusen, no macular thickening    Vessels Normal    Periphery Normal             IMAGING AND PROCEDURES  Imaging and Procedures for 04/27/21  OCT, Retina - OU - Both Eyes       Right Eye Quality was good. Scan locations included subfoveal. Central Foveal Thickness: 241. Progression has been stable. Findings include abnormal foveal contour, vitreomacular adhesion .   Left Eye Quality was good. Scan locations included subfoveal. Central Foveal Thickness: 234. Progression has improved. Findings include abnormal foveal contour, vitreomacular adhesion .   Notes OD with persistent partial VM traction no inner foveal macular changes.  No sign of CNVM OD will continue to observe             ASSESSMENT/PLAN:  Vitreomacular adhesion of right eye OD   stable and  Exudative age-related macular degeneration of right eye with active choroidal neovascularization (HCC) OD no sign of active CNVM will observe, today at 13-week follow-up  Exudative age-related  macular degeneration of  left eye with active choroidal neovascularization (HCC) OS stable today no sign of active CNVM will follow     ICD-10-CM   1. Advanced nonexudative age-related macular degeneration of left eye without subfoveal involvement  H35.3123     2. Exudative age-related macular degeneration of right eye with active choroidal neovascularization (HCC)  H35.3211 OCT, Retina - OU - Both Eyes    3. Vitreomacular adhesion of right eye  H43.821     4. Exudative age-related macular degeneration of left eye with active choroidal neovascularization ([place])  H35.3221       1.  OD today at 13-week follow-up, no sign of active CNVM will observe  2.  OS on a different schedule at 12 weeks currently and will follow-up as scheduled and likely injection in 2 weeks as scheduled  3.  Ophthalmic Meds Ordered this visit:  No orders of the defined types were placed in this encounter.      No follow-ups on file.  There are no Patient Instructions on file for this visit.   Explained the diagnoses, plan, and follow up with the patient and they expressed understanding.  Patient expressed understanding of the importance of proper follow up care.   Clent Demark Aubry Tucholski M.D. Diseases & Surgery of the Retina and Vitreous Retina & Diabetic [place] 04/27/21     Abbreviations: M myopia (nearsighted); A astigmatism; H hyperopia (farsighted); P presbyopia; Mrx spectacle prescription;  CTL contact lenses; OD right eye; OS left eye; OU both eyes  XT exotropia; ET esotropia; PEK punctate epithelial keratitis; PEE punctate epithelial erosions; DES dry eye syndrome; MGD meibomian gland dysfunction; ATs artificial tears; PFAT's preservative free artificial tears; [place] nuclear sclerotic cataract; PSC posterior subcapsular cataract; ERM epi-retinal membrane; PVD posterior vitreous detachment; RD retinal detachment; DM diabetes mellitus; DR diabetic retinopathy; NPDR non-proliferative diabetic retinopathy;  PDR proliferative diabetic retinopathy; CSME clinically significant macular edema; DME diabetic macular edema; dbh dot blot hemorrhages; CWS cotton wool spot; POAG primary open angle glaucoma; C/D cup-to-disc ratio; HVF humphrey visual field; GVF goldmann visual field; OCT optical coherence tomography; IOP intraocular pressure; BRVO Branch retinal vein occlusion; CRVO central retinal vein occlusion; CRAO central retinal artery occlusion; BRAO branch retinal artery occlusion; RT retinal tear; SB scleral buckle; PPV pars plana vitrectomy; VH Vitreous hemorrhage; PRP panretinal laser photocoagulation; IVK intravitreal kenalog; VMT vitreomacular traction; MH Macular hole;  NVD neovascularization of the disc; NVE neovascularization elsewhere; AREDS age related eye disease study; ARMD age related macular degeneration; POAG primary open angle glaucoma; EBMD epithelial/anterior basement membrane dystrophy; ACIOL anterior chamber intraocular lens; IOL intraocular lens; PCIOL posterior chamber intraocular lens; Phaco/IOL phacoemulsification with intraocular lens placement; [place] photorefractive keratectomy; LASIK laser assisted in situ keratomileusis; HTN hypertension; DM diabetes mellitus; COPD chronic obstructive pulmonary disease

## 2021-04-27 NOTE — Assessment & Plan Note (Signed)
OD no sign of active CNVM will observe, today at 13-week follow-up

## 2021-04-27 NOTE — Assessment & Plan Note (Signed)
OS stable today no sign of active CNVM will follow

## 2021-04-27 NOTE — Assessment & Plan Note (Addendum)
OD   stable and

## 2021-05-04 ENCOUNTER — Encounter (INDEPENDENT_AMBULATORY_CARE_PROVIDER_SITE_OTHER): Payer: Self-pay | Admitting: Ophthalmology

## 2021-05-11 ENCOUNTER — Encounter (INDEPENDENT_AMBULATORY_CARE_PROVIDER_SITE_OTHER): Payer: Self-pay | Admitting: Ophthalmology

## 2021-05-11 ENCOUNTER — Other Ambulatory Visit: Payer: Self-pay

## 2021-05-11 ENCOUNTER — Ambulatory Visit (INDEPENDENT_AMBULATORY_CARE_PROVIDER_SITE_OTHER): Payer: Medicare Other | Admitting: Ophthalmology

## 2021-05-11 DIAGNOSIS — H353221 Exudative age-related macular degeneration, left eye, with active choroidal neovascularization: Secondary | ICD-10-CM

## 2021-05-11 DIAGNOSIS — H353211 Exudative age-related macular degeneration, right eye, with active choroidal neovascularization: Secondary | ICD-10-CM | POA: Diagnosis not present

## 2021-05-11 DIAGNOSIS — H353123 Nonexudative age-related macular degeneration, left eye, advanced atrophic without subfoveal involvement: Secondary | ICD-10-CM

## 2021-05-11 DIAGNOSIS — H43821 Vitreomacular adhesion, right eye: Secondary | ICD-10-CM | POA: Diagnosis not present

## 2021-05-11 MED ORDER — BEVACIZUMAB 2.5 MG/0.1ML IZ SOSY
2.5000 mg | PREFILLED_SYRINGE | INTRAVITREAL | Status: AC | PRN
  Administered 2021-05-11: 2.5 mg via INTRAVITREAL

## 2021-05-11 NOTE — Assessment & Plan Note (Signed)
OS now at 6-month follow-up, no signs of recurrent CNVM will continue to monitor

## 2021-05-11 NOTE — Assessment & Plan Note (Signed)
The nature of wet macular degeneration was discussed with the patient.  Forms of therapy reviewed include the use of Anti-VEGF medications injected painlessly into the eye, as well as other possible treatment modalities, including thermal laser therapy. Fellow eye involvement and risks were discussed with the patient. Upon the finding of wet age related macular degeneration, treatment will be offered. The treatment regimen is on a treat as needed basis with the intent to treat if necessary and extend interval of exams when possible. On average 1 out of 6 patients do not need lifetime therapy. However, the risk of recurrent disease is high for a lifetime.  Initially monthly, then periodic, examinations and evaluations will determine whether the next treatment is required on the day of the examination.  OD, recurrent now at 67-month follow-up post injection.  Will not need to repeat injection for new lesion active superior to FAZ

## 2021-05-11 NOTE — Assessment & Plan Note (Signed)
Mild, physiologic no impact

## 2021-05-11 NOTE — Progress Notes (Signed)
05/11/2021     CHIEF COMPLAINT Patient presents for  Chief Complaint  Patient presents with   Retina Follow Up      HISTORY OF PRESENT ILLNESS: Barbara Mccoy is a 87 y.o. female who presents to the clinic today for:   HPI     Retina Follow Up   Patient presents with  Wet AMD.  In left eye.  This started 12 weeks ago.  Severity is mild.  Duration of 12 weeks.  Since onset it is stable.        Comments   12 week fu OS and OCT and Avastin OS Pt states VA OU stable since last visit. Pt denies FOL, floaters, or ocular pain OU.        Last edited by Kendra Opitz, COA on 05/11/2021  1:07 PM.      Referring physician: Burnard Bunting, MD [place],  [place] [address]  HISTORICAL INFORMATION:   Selected notes from the [facility]: No current outpatient medications on file. (Ophthalmic Drugs)   No current facility-administered medications for this visit. (Ophthalmic Drugs)   Current Outpatient Medications (Other)  Medication Sig   apixaban (ELIQUIS) 5 MG TABS tablet TAKE ONE TABLET BY MOUTH TWICE DAILY   cholecalciferol (VITAMIN D3) 25 MCG (1000 UNIT) tablet Take 1,000 Units by mouth daily.   MAGNESIUM CITRATE PO Take 250 mg by mouth daily.   Multiple Minerals-Vitamins (GNP CAL MAG ZINC +D3 PO) Take 1 tablet by mouth in the morning and at bedtime.   Multiple Vitamins-Minerals (PRESERVISION AREDS 2+MULTI VIT PO) Take 1 capsule by mouth in the morning and at bedtime.   simvastatin (ZOCOR) 20 MG tablet Take 20 mg by mouth daily.   No current facility-administered medications for this visit. 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, receives injections every 8-12 weeks   Migraine  headache 1950   "fewer in old age"   Squamous cell carcinoma of skin of chest 08/05/2020   Past Surgical History:  Procedure Laterality Date   BREAST LUMPECTOMY WITH RADIOACTIVE SEED LOCALIZATION Left 01/02/2021   Procedure: LEFT BREAST LUMPECTOMY WITH RADIOACTIVE SEED LOCALIZATION;  Surgeon: Erroll Luna, MD;  Location: [facility];  Service: General;  Laterality: Left;   CARDIOVERSION N/A 01/30/2021   Procedure: CARDIOVERSION;  Surgeon: Geralynn Rile, MD;  Location: [facility];  Service: Cardiovascular;  Laterality: N/A;   CATARACT EXTRACTION, BILATERAL     INGUINAL HERNIA REPAIR     in 29's   SKIN CANCER EXCISION     TONSILLECTOMY     in childhood   TOTAL ABDOMINAL HYSTERECTOMY     w/ BSO, in 72's    FAMILY HISTORY Family History  Problem Relation Age of Onset   Brain cancer Father     SOCIAL HISTORY Social History   Tobacco Use   Smoking status: Never   Smokeless tobacco: Never  Substance Use Topics   Alcohol use: Yes    Alcohol/week: 5.0 standard drinks    Types: 4 Glasses of wine, 1 Shots of liquor per week   Drug use: Never  OPHTHALMIC EXAM:  Base Eye Exam     Visual Acuity (ETDRS)       Right Left   Dist cc 20/20 -1 20/20 -1    Correction: Glasses         Tonometry (Tonopen, 1:12 PM)       Right Left   Pressure 13 15         Pupils       Pupils Dark Light Shape React APD   Right PERRL 3 2 Round Brisk None   Left PERRL 3 1 Round Brisk None         Visual Fields (Counting fingers)       Left Right    Full Full         Extraocular Movement       Right Left    Full Full         Neuro/Psych     Oriented x3: Yes   Mood/Affect: Normal         Dilation     Both eyes: 1.0% Mydriacyl, 2.5% Phenylephrine @ 1:12 PM           Slit Lamp and Fundus Exam     External Exam       Right Left   External Normal Normal         Slit Lamp Exam       Right Left   Lids/Lashes Normal  Normal   Conjunctiva/Sclera White and quiet White and quiet   Cornea Clear Clear   Anterior Chamber Deep and quiet Deep and quiet   Iris Round and reactive Round and reactive   Lens Posterior chamber intraocular lens, Open posterior capsule Posterior chamber intraocular lens   Anterior Vitreous Normal Normal         Fundus Exam       Right Left   Posterior Vitreous Posterior vitreous detachment partial Normal   Disc Normal Normal   C/D Ratio 0.4 0.4   Macula Retinal pigment epithelial mottling, no exudates, Hard drusen, , Retinal pigment epithelial atrophy, Early age related macular degeneration, Soft drusen, no macular thickening Atrophy, Retinal pigment epithelial atrophy, Age related macular degeneration, Early age related macular degeneration, Drusen, Soft drusen, no hemorrhage   Vessels Normal Normal   Periphery Normal Normal            IMAGING AND PROCEDURES  Imaging and Procedures for 05/11/21  OCT, Retina - OU - Both Eyes       Right Eye Quality was good. Scan locations included subfoveal. Central Foveal Thickness: 263. Progression has worsened. Findings include abnormal foveal contour, vitreomacular adhesion , cystoid macular edema.   Left Eye Quality was good. Scan locations included subfoveal. Central Foveal Thickness: 233. Progression has improved. Findings include abnormal foveal contour, vitreomacular adhesion .   Notes OD with persistent partial VM traction no inner foveal macular changes.  OD, at 3 months postinjection.  With new active disease will need to repeat injection today and evaluate again in 2 months OD    OS, now at 5 months postinjection, no signs of abnormal CNVM     Intravitreal Injection, Pharmacologic Agent - OD - Right Eye       Time Out 05/11/2021. 1:48 PM. Confirmed correct patient, procedure, site, and patient consented.   Anesthesia Topical anesthesia was used. Anesthetic medications included [place] 3.5%.    Procedure Preparation included Tobramycin 0.3%, Ofloxacin , 10% betadine to eyelids, 5% betadine to ocular surface. A 30 gauge needle was  used.   Injection: 2.5 mg bevacizumab 2.5 MG/0.1ML   Route: Intravitreal, Site: Right Eye   NDC: [phone]   Post-op Post injection exam found visual acuity of at least counting fingers. The patient tolerated the procedure well. There were no complications. The patient received written and verbal post procedure care education. Post injection medications included ocuflox.              ASSESSMENT/PLAN:  Exudative age-related macular degeneration of right eye with active choroidal neovascularization (HCC) The nature of wet macular degeneration was discussed with the patient.  Forms of therapy reviewed include the use of Anti-VEGF medications injected painlessly into the eye, as well as other possible treatment modalities, including thermal laser therapy. Fellow eye involvement and risks were discussed with the patient. Upon the finding of wet age related macular degeneration, treatment will be offered. The treatment regimen is on a treat as needed basis with the intent to treat if necessary and extend interval of exams when possible. On average 1 out of 6 patients do not need lifetime therapy. However, the risk of recurrent disease is high for a lifetime.  Initially monthly, then periodic, examinations and evaluations will determine whether the next treatment is required on the day of the examination.  OD, recurrent now at 87-month follow-up post injection.  Will not need to repeat injection for new lesion active superior to FAZ  Exudative age-related macular degeneration of left eye with active choroidal neovascularization ([place]) OS now at 9-month follow-up, no signs of recurrent CNVM will continue to monitor  Vitreomacular adhesion of right eye Mild, physiologic no impact     ICD-10-CM   1. Exudative age-related macular degeneration of right eye  with active choroidal neovascularization (HCC)  H35.3211 Intravitreal Injection, Pharmacologic Agent - OD - Right Eye    bevacizumab (AVASTIN) SOSY 2.5 mg    2. Advanced nonexudative age-related macular degeneration of left eye without subfoveal involvement  H35.3123 OCT, Retina - OU - Both Eyes    CANCELED: Intravitreal Injection, Pharmacologic Agent - OS - Left Eye    3. Exudative age-related macular degeneration of left eye with active choroidal neovascularization ([place])  H35.3221     4. Vitreomacular adhesion of right eye  H43.821       1.  OD, with new onset CNVM activity now at 3 months postinjection.  We will repeat injection today for this area of involvement superior to the FAZ.  And follow-up again in 2 months OD.  2.  OS continues to do well 5 months post most recent injection thus 3-1/2 months off therapy.  No sign of recurrence we will continue to observe and monitor  3.  Ophthalmic Meds Ordered this visit:  Meds ordered this encounter  Medications   bevacizumab (AVASTIN) SOSY 2.5 mg       Return in about 2 months (around 07/11/2021) for dilate, OD, AVASTIN OCT.  There are no Patient Instructions on file for this visit.   Explained the diagnoses, plan, and follow up with the patient and they expressed understanding.  Patient expressed understanding of the importance of proper follow up care.   Clent Demark Nastassia Bazaldua M.D. Diseases & Surgery of the Retina and Vitreous Retina & Diabetic [place] 05/11/21     Abbreviations: M myopia (nearsighted); A astigmatism; H hyperopia (farsighted); P presbyopia; Mrx spectacle prescription;  CTL contact lenses; OD right eye; OS left eye; OU both eyes  XT exotropia; ET esotropia; PEK punctate epithelial keratitis; PEE punctate epithelial erosions; DES  dry eye syndrome; MGD meibomian gland dysfunction; ATs artificial tears; PFAT's preservative free artificial tears; [place] nuclear sclerotic cataract; PSC posterior subcapsular cataract;  ERM epi-retinal membrane; PVD posterior vitreous detachment; RD retinal detachment; DM diabetes mellitus; DR diabetic retinopathy; NPDR non-proliferative diabetic retinopathy; PDR proliferative diabetic retinopathy; CSME clinically significant macular edema; DME diabetic macular edema; dbh dot blot hemorrhages; CWS cotton wool spot; POAG primary open angle glaucoma; C/D cup-to-disc ratio; HVF humphrey visual field; GVF goldmann visual field; OCT optical coherence tomography; IOP intraocular pressure; BRVO Branch retinal vein occlusion; CRVO central retinal vein occlusion; CRAO central retinal artery occlusion; BRAO branch retinal artery occlusion; RT retinal tear; SB scleral buckle; PPV pars plana vitrectomy; VH Vitreous hemorrhage; PRP panretinal laser photocoagulation; IVK intravitreal kenalog; VMT vitreomacular traction; MH Macular hole;  NVD neovascularization of the disc; NVE neovascularization elsewhere; AREDS age related eye disease study; ARMD age related macular degeneration; POAG primary open angle glaucoma; EBMD epithelial/anterior basement membrane dystrophy; ACIOL anterior chamber intraocular lens; IOL intraocular lens; PCIOL posterior chamber intraocular lens; Phaco/IOL phacoemulsification with intraocular lens placement; [place] photorefractive keratectomy; LASIK laser assisted in situ keratomileusis; HTN hypertension; DM diabetes mellitus; COPD chronic obstructive pulmonary disease

## 2021-05-15 DIAGNOSIS — Z23 Encounter for immunization: Secondary | ICD-10-CM | POA: Diagnosis not present

## 2021-05-26 ENCOUNTER — Other Ambulatory Visit (HOSPITAL_BASED_OUTPATIENT_CLINIC_OR_DEPARTMENT_OTHER): Payer: Self-pay

## 2021-05-26 DIAGNOSIS — Z23 Encounter for immunization: Secondary | ICD-10-CM | POA: Diagnosis not present

## 2021-05-26 MED ORDER — INFLUENZA VAC A&B SA ADJ QUAD 0.5 ML IM PRSY
PREFILLED_SYRINGE | INTRAMUSCULAR | 0 refills | Status: AC
  Filled 2021-05-26: qty 0.5, 1d supply, fill #0

## 2021-05-26 NOTE — Progress Notes (Signed)
[place]  [phone] [phone]     ID: Barbara Mccoy DOB: 23-Apr-1933  MR#: [id number]  WHQ#:[id number]  Patient Care Team: Burnard Bunting, MD as PCP - General (Internal Medicine) Mauro Kaufmann, RN as Oncology Nurse Navigator Rockwell Germany, RN as Oncology Nurse Navigator Erroll Luna, MD as Consulting Physician (General Surgery) Cherylann Hobday, Virgie Dad, MD as Consulting Physician (Oncology) Eppie Gibson, MD as Attending Physician (Radiation Oncology) Rolm Bookbinder, MD as Consulting Physician (Dermatology) Zadie Rhine Clent Demark, MD as Consulting Physician (Ophthalmology) Chauncey Cruel, MD OTHER MD:  CHIEF COMPLAINT: estrogen receptor positive noninvasive breast cancer  CURRENT TREATMENT: Observation   INTERVAL HISTORY: Barbara Mccoy returns today for follow up of her noninvasive breast cancer. She was evaluated in the multidisciplinary breast cancer clinic on 12/24/2020.  She proceeded to left lumpectomy on 01/02/2021 under Dr. Brantley Stage. Pathology from the procedure [phone]) showed: low grade ductal carcinoma in situ, 1.5 cm; lobular neoplasia; final margins negative.  She was referred back to Dr. Isidore Moos on 02/04/2021 to review radiation therapy. She subsequently received treatment from 02/12/2021 through 03/06/2021.   REVIEW OF SYSTEMS: Barbara Mccoy did very well with her surgery.  She tells me on the table they discovered she had A. fib.  She is seeing Dr Gardiner Rhyme and is taking Eliquis.  She thinks the Eliquis is making her little wobbly and will discuss dropping the dose when she sees them again soon.  She has had no falls.  She exercises regularly at [facility] through their exercise program.  A detailed review of systems was otherwise stable   COVID 19 VACCINATION STATUS: fully vaccinated Levan Hurst), with 2 boosters   HISTORY OF CURRENT ILLNESS: From the original intake note:  Barbara Mccoy had routine screening mammography on 11/10/2020 showing a  possible abnormality in the left breast. She underwent left diagnostic mammography with tomography at [facility] on 11/20/2020 showing: breast density category B; 7 mm cluster of amorphous calcifications in left breast at 5 o'clock.  Accordingly on 12/10/2020 she proceeded to biopsy of the left breast area in question. The pathology from this procedure ([id number]) showed: ductal carcinoma in situ with necrosis and calcifications, high grade. Prognostic indicators significant for: estrogen receptor, >95% positive and progesterone receptor, 20% positive, both with strong staining intensity.   Cancer Staging Ductal carcinoma in situ (DCIS) of left breast Staging form: Breast, AJCC 8th Edition - Clinical stage from 12/24/2020: Stage 0 (cTis (DCIS), cN0, cM0, ER+, PR+, HER2: Not Assessed) - Unsigned Stage prefix: Initial diagnosis Nuclear grade: G3 Laterality: Left Staged by: Pathologist and managing physician Stage used in treatment planning: Yes National guidelines used in treatment planning: Yes Type of national guideline used in treatment planning: NCCN  The patient's subsequent history is as detailed below.   PAST MEDICAL HISTORY: Past Medical History:  Diagnosis Date   Cataract    bilateral   Irritable bowel 1950   type 3C   Macular degeneration, wet ([place])    bilateral, receives injections every 8-12 weeks   Migraine headache 1950   "fewer in old age"   Squamous cell carcinoma of skin of chest 08/05/2020    PAST SURGICAL HISTORY: Past Surgical History:  Procedure Laterality Date   BREAST LUMPECTOMY WITH RADIOACTIVE SEED LOCALIZATION Left 01/02/2021   Procedure: LEFT BREAST LUMPECTOMY WITH RADIOACTIVE SEED LOCALIZATION;  Surgeon: Erroll Luna, MD;  Location: [place];  Service: General;  Laterality: Left;   CARDIOVERSION N/A 01/30/2021   Procedure: CARDIOVERSION;  Surgeon: Geralynn Rile,  MD;  Location: [place];  Service: Cardiovascular;  Laterality: N/A;    CATARACT EXTRACTION, BILATERAL     INGUINAL HERNIA REPAIR     in 58's   SKIN CANCER EXCISION     TONSILLECTOMY     in childhood   TOTAL ABDOMINAL HYSTERECTOMY     w/ BSO, in 53's    FAMILY HISTORY: Family History  Problem Relation Age of Onset   Brain cancer Father    Her father died at age 28 from astrocytoma, diagnosed the year prior. Her mother died at age 39. Barbara Mccoy is an only child. Aside from her father, there is no family history of cancer to her knowledge.   GYNECOLOGIC HISTORY:  No LMP recorded (lmp unknown). Patient has had a hysterectomy. Menarche: 41-43 years old Age at first live birth: 87 years old [place] P 2 LMP with hysterectomy HRT used for >10 years  Hysterectomy? Yes, in her 54's BSO? yes   SOCIAL HISTORY: (updated 11/2020)  Barbara Mccoy is currently retired from working as a Corporate investment banker) at [employer]. She is widowed and lives in [place]. Her late husband was Dr. Onnie Boer, a cardiothoracic surgeon who performed the first bypass surgery in [facility]! Son Barbara Mccoy, age 39, is retired from working for the [employer] and lives in [place], [place]. Son Barbara Mccoy, age 85, works for the [employer] and lives in [place], [place]. Adopted daughter Barbara Mccoy, age 71, is unemployed and lives here in [place]. Barbara Mccoy has two grandchildren, ages 63 and 14.     ADVANCED DIRECTIVES: in place, son Clair Gulling is her HCPOA   HEALTH MAINTENANCE: Social History   Tobacco Use   Smoking status: Never   Smokeless tobacco: Never  Substance Use Topics   Alcohol use: Yes    Alcohol/week: 5.0 standard drinks    Types: 4 Glasses of wine, 1 Shots of liquor per week   Drug use: Never     Colonoscopy: last at age 34, recall not required (age)  PAP: none, s/p hysterectomy  Bone density: done at [facility]   Allergies  Allergen Reactions   Codeine Rash    Current Outpatient Medications  Medication Sig Dispense Refill   apixaban (ELIQUIS) 5 MG TABS tablet TAKE ONE TABLET BY  MOUTH TWICE DAILY 180 tablet 1   cholecalciferol (VITAMIN D3) 25 MCG (1000 UNIT) tablet Take 1,000 Units by mouth daily.     influenza vaccine adjuvanted (FLUAD) 0.5 ML injection Inject into the muscle. 0.5 mL 0   MAGNESIUM CITRATE PO Take 250 mg by mouth daily.     Multiple Minerals-Vitamins (GNP CAL MAG ZINC +D3 PO) Take 1 tablet by mouth in the morning and at bedtime.     Multiple Vitamins-Minerals (PRESERVISION AREDS 2+MULTI VIT PO) Take 1 capsule by mouth in the morning and at bedtime.     simvastatin (ZOCOR) 20 MG tablet Take 20 mg by mouth daily.     No current facility-administered medications for this visit.    OBJECTIVE: White woman who appears younger than stated age  8:   05/27/21 1039  BP: 140/70  Pulse: 82  Resp: 18  Temp: (!) 97.5 F (36.4 C)  SpO2: 98%      Body mass index is 27.84 kg/m.   Wt Readings from Last 3 Encounters:  05/27/21 167 lb 4.8 oz (75.9 kg)  03/03/21 167 lb 12.8 oz (76.1 kg)  02/04/21 165 lb (74.8 kg)      ECOG FS:1 - Symptomatic but completely  ambulatory  Sclerae unicteric, EOMs intact Wearing a mask No cervical or supraclavicular adenopathy Lungs no rales or rhonchi Heart regular rate and rhythm Abd soft, nontender, positive bowel sounds MSK no focal spinal tenderness, no upper extremity lymphedema Neuro: nonfocal, well oriented, appropriate affect Breasts: The right breast is benign.  The left breast is status post lumpectomy and radiation.  The cosmetic result is excellent.  There is no evidence of local recurrence.  Both axillae are benign   LAB RESULTS:  CMP     Component Value Date/Time   NA 139 01/23/2021 1103   K 4.5 01/23/2021 1103   CL 103 01/23/2021 1103   CO2 21 01/23/2021 1103   GLUCOSE 95 01/23/2021 1103   GLUCOSE 101 (H) 12/26/2020 1559   BUN 12 01/23/2021 1103   CREATININE 0.69 01/23/2021 1103   CREATININE 0.67 12/24/2020 0816   CALCIUM 9.9 01/23/2021 1103   PROT 6.3 (L) 12/26/2020 1559   ALBUMIN 4.0  12/26/2020 1559   AST 23 12/26/2020 1559   AST 24 12/24/2020 0816   ALT 23 12/26/2020 1559   ALT 25 12/24/2020 0816   ALKPHOS 74 12/26/2020 1559   BILITOT 0.8 12/26/2020 1559   BILITOT 0.6 12/24/2020 0816   GFRNONAA >60 12/26/2020 1559   GFRNONAA >60 12/24/2020 0816    No results found for: TOTALPROTELP, ALBUMINELP, A1GS, A2GS, BETS, BETA2SER, GAMS, MSPIKE, SPEI  Lab Results  Component Value Date   WBC 6.5 01/23/2021   NEUTROABS 4.3 12/26/2020   HGB 16.0 (H) 01/23/2021   HCT 46.4 01/23/2021   MCV 91 01/23/2021   PLT 195 01/23/2021    No results found for: LABCA2  No components found for: VEHMCN470  No results for input(s): INR in the last 168 hours.  No results found for: LABCA2  No results found for: JGG836  No results found for: OQH476  No results found for: LYY503  No results found for: CA2729  No components found for: HGQUANT  No results found for: CEA1 / No results found for: CEA1   No results found for: AFPTUMOR  No results found for: CHROMOGRNA  No results found for: KPAFRELGTCHN, LAMBDASER, KAPLAMBRATIO (kappa/lambda light chains)  No results found for: HGBA, HGBA2QUANT, HGBFQUANT, HGBSQUAN (Hemoglobinopathy evaluation)   No results found for: LDH  No results found for: IRON, TIBC, IRONPCTSAT (Iron and TIBC)  No results found for: FERRITIN  Urinalysis No results found for: COLORURINE, APPEARANCEUR, LABSPEC, PHURINE, GLUCOSEU, HGBUR, BILIRUBINUR, KETONESUR, PROTEINUR, UROBILINOGEN, NITRITE, LEUKOCYTESUR   STUDIES: Intravitreal Injection, Pharmacologic Agent - OD - Right Eye  Result Date: 05/11/2021 Time Out 05/11/2021. 1:48 PM. Confirmed correct patient, procedure, site, and patient consented. Anesthesia Topical anesthesia was used. Anesthetic medications included Akten 3.5%. Procedure Preparation included Tobramycin 0.3%, Ofloxacin , 10% betadine to eyelids, 5% betadine to ocular surface. A 30 gauge needle was used. Injection: 2.5 mg  bevacizumab 2.5 MG/0.1ML   Route: Intravitreal, Site: Right Eye   NDC: [phone] Post-op Post injection exam found visual acuity of at least counting fingers. The patient tolerated the procedure well. There were no complications. The patient received written and verbal post procedure care education. Post injection medications included ocuflox.   OCT, Retina - OU - Both Eyes  Result Date: 05/11/2021 Right Eye Quality was good. Scan locations included subfoveal. Central Foveal Thickness: 263. Progression has worsened. Findings include abnormal foveal contour, vitreomacular adhesion , cystoid macular edema. Left Eye Quality was good. Scan locations included subfoveal. Central Foveal Thickness: 233. Progression has improved. Findings include  abnormal foveal contour, vitreomacular adhesion . Notes OD with persistent partial VM traction no inner foveal macular changes.  OD, at 3 months postinjection.  With new active disease will need to repeat injection today and evaluate again in 2 months OD OS, now at 5 months postinjection, no signs of abnormal CNVM  OCT, Retina - OU - Both Eyes  Result Date: 04/27/2021 Right Eye Quality was good. Scan locations included subfoveal. Central Foveal Thickness: 241. Progression has been stable. Findings include abnormal foveal contour, vitreomacular adhesion . Left Eye Quality was good. Scan locations included subfoveal. Central Foveal Thickness: 234. Progression has improved. Findings include abnormal foveal contour, vitreomacular adhesion . Notes OD with persistent partial VM traction no inner foveal macular changes.  No sign of CNVM OD will continue to observe    ELIGIBLE FOR AVAILABLE RESEARCH PROTOCOL:no  ASSESSMENT: 87 y.o. [place] woman status post left breast biopsy 12/10/2020 for ductal carcinoma in situ, grade 3, estrogen and progesterone receptor positive  (1) left lumpectomy 01/02/2021 showed ductal carcinoma in situ, grade 1, measuring 1.5 cm, with  negative margins.  (2) adjuvant radiation 02/12/2021 - 03/06/2021 Plan ID Fractions First Treatment Last Treatment  Dose  Breast_Lt_BH 16 / 16 02/12/2021 03/06/2021 42.56Gy/ 42.56Gy    (3) we opted to forgo antiestrogen treatment   PLAN: Barbara Mccoy has completed local treatment for her noninvasive breast cancer.  Her risk of this cancer coming back is quite low, in the 5% range.  The benefit from antiestrogens as far as his cancer is concerned is minimal and given the possibility of significant side effects and complications we are opting against those.  Of course antiestrogens do reduce the risk of another breast cancer recurrence.  That risk approaches 1 %/year.  If Barbara Mccoy makes it 200, which is not at all unlikely I think, that means she has a 15% chance of developing another breast cancer in the future and better said an 85% of the.  That also leads [place] to know antiestrogens  Follow-up here is optional and we discussed that but she likes the idea of coming once a year after her mammogram to review the mammography and to have a thorough physician breast exam.  We are glad to accommodate that.  Accordingly she will have mammography in April and see [place] again in May 2023  Total encounter time 20 minutes.Sarajane Jews C. Kyng Matlock, MD 05/27/2021 10:58 AM Medical Oncology and Hematology [facility] [place], [place] [address] Tel. [phone]    Fax. [phone]   This document serves as a record of services personally performed by Lurline Del, MD. It was created on his behalf by Wilburn Mylar, a trained medical scribe. The creation of this record is based on the scribe's personal observations and the provider's statements to them.   I, Lurline Del MD, have reviewed the above documentation for accuracy and completeness, and I agree with the above.   *Total Encounter Time as defined by the Centers for Medicare and Medicaid Services includes, in addition to  the face-to-face time of a patient visit (documented in the note above) non-face-to-face time: obtaining and reviewing outside history, ordering and reviewing medications, tests or procedures, care coordination (communications with other health care professionals or caregivers) and documentation in the medical record.

## 2021-05-27 ENCOUNTER — Other Ambulatory Visit: Payer: Self-pay

## 2021-05-27 ENCOUNTER — Telehealth: Payer: Self-pay | Admitting: Cardiology

## 2021-05-27 ENCOUNTER — Inpatient Hospital Stay: Payer: Medicare Other | Attending: Oncology | Admitting: Oncology

## 2021-05-27 VITALS — BP 140/70 | HR 82 | Temp 97.5°F | Resp 18 | Ht 65.0 in | Wt 167.3 lb

## 2021-05-27 DIAGNOSIS — Z170 Estrogen receptor positive status [ER+]: Secondary | ICD-10-CM | POA: Insufficient documentation

## 2021-05-27 DIAGNOSIS — D0512 Intraductal carcinoma in situ of left breast: Secondary | ICD-10-CM | POA: Diagnosis not present

## 2021-05-27 DIAGNOSIS — Z923 Personal history of irradiation: Secondary | ICD-10-CM | POA: Insufficient documentation

## 2021-05-27 NOTE — Telephone Encounter (Signed)
Pt c/o medication issue:  1. Name of Medication: apixaban (ELIQUIS) 5 MG TABS tablet  2. How are you currently taking this medication (dosage and times per day)? As directed  3. Are you having a reaction (difficulty breathing--STAT)?   4. What is your medication issue? Patient said she has been unsteady on her feet lately. She takes exercise classes and has to be more careful. She wanted to talk to [facility] about her symptoms

## 2021-05-27 NOTE — Telephone Encounter (Signed)
Would recommend repeating ZIO x2 weeks to see if having any pauses off of metoprolol.  If she is having significant pauses off of metoprolol, may need pacemaker.  Would also recommend being seen by PCP to evaluate neuro symptoms.

## 2021-05-27 NOTE — Telephone Encounter (Signed)
Spoke with the patient who reports that she has been having some ataxia. She reports that she has noticed this for a few months now and thought it could be caused by her Eliquis. She reports that she goes to exercise classes and is having to be much more careful. She states that she has not had any falls. She denies feeling faint or being dizzy. She reports blood pressures have been good: 120/80, 164/?, 128/78, 134/86, and 132/76. Heart rate has been in upper 70s-80s. She has not talked to her PCP to rule out any neurological causes of ataxia but doesn't feel as if this is the cause. She reports having carotid dopplers done for eye symptoms which were actually due to migraines and these were normal.  Patient reports that she does feel palpitations at times but she is able to ignore them and they do not bother her. Patient is just concerned about feeling unsteady and is worried about falling and being on Eliquis.

## 2021-05-28 NOTE — Telephone Encounter (Signed)
Called patient-she is at work and request call back after lunch.    Will call back to discuss

## 2021-06-02 NOTE — Telephone Encounter (Signed)
Left message to call back  

## 2021-06-12 NOTE — Telephone Encounter (Signed)
Left message to call back  

## 2021-06-17 NOTE — Telephone Encounter (Signed)
Attempt to call patient-left message to call back.  Unable to reach x 3-will await call back

## 2021-06-18 DIAGNOSIS — M859 Disorder of bone density and structure, unspecified: Secondary | ICD-10-CM | POA: Diagnosis not present

## 2021-06-18 DIAGNOSIS — E785 Hyperlipidemia, unspecified: Secondary | ICD-10-CM | POA: Diagnosis not present

## 2021-06-18 DIAGNOSIS — R7301 Impaired fasting glucose: Secondary | ICD-10-CM | POA: Diagnosis not present

## 2021-06-22 DIAGNOSIS — E663 Overweight: Secondary | ICD-10-CM | POA: Diagnosis not present

## 2021-06-22 DIAGNOSIS — Z0000 Encounter for general adult medical examination without abnormal findings: Secondary | ICD-10-CM | POA: Diagnosis not present

## 2021-06-22 DIAGNOSIS — R7301 Impaired fasting glucose: Secondary | ICD-10-CM | POA: Diagnosis not present

## 2021-06-22 DIAGNOSIS — R82998 Other abnormal findings in urine: Secondary | ICD-10-CM | POA: Diagnosis not present

## 2021-06-22 DIAGNOSIS — F329 Major depressive disorder, single episode, unspecified: Secondary | ICD-10-CM | POA: Diagnosis not present

## 2021-06-22 DIAGNOSIS — C50919 Malignant neoplasm of unspecified site of unspecified female breast: Secondary | ICD-10-CM | POA: Diagnosis not present

## 2021-06-22 DIAGNOSIS — M8580 Other specified disorders of bone density and structure, unspecified site: Secondary | ICD-10-CM | POA: Diagnosis not present

## 2021-06-30 DIAGNOSIS — Z1212 Encounter for screening for malignant neoplasm of rectum: Secondary | ICD-10-CM | POA: Diagnosis not present

## 2021-07-13 ENCOUNTER — Ambulatory Visit (INDEPENDENT_AMBULATORY_CARE_PROVIDER_SITE_OTHER): Payer: Medicare Other | Admitting: Ophthalmology

## 2021-07-13 ENCOUNTER — Other Ambulatory Visit: Payer: Self-pay

## 2021-07-13 ENCOUNTER — Encounter (INDEPENDENT_AMBULATORY_CARE_PROVIDER_SITE_OTHER): Payer: Self-pay | Admitting: Ophthalmology

## 2021-07-13 DIAGNOSIS — H353211 Exudative age-related macular degeneration, right eye, with active choroidal neovascularization: Secondary | ICD-10-CM

## 2021-07-13 DIAGNOSIS — H353123 Nonexudative age-related macular degeneration, left eye, advanced atrophic without subfoveal involvement: Secondary | ICD-10-CM | POA: Diagnosis not present

## 2021-07-13 MED ORDER — BEVACIZUMAB 2.5 MG/0.1ML IZ SOSY
2.5000 mg | PREFILLED_SYRINGE | INTRAVITREAL | Status: AC | PRN
  Administered 2021-07-13: 2.5 mg via INTRAVITREAL

## 2021-07-13 NOTE — Assessment & Plan Note (Signed)
As of today 7 months post therapy, no recurrence OS continue to observe

## 2021-07-13 NOTE — Assessment & Plan Note (Signed)
The nature of wet macular degeneration was discussed with the patient.  Forms of therapy reviewed include the use of Anti-VEGF medications injected painlessly into the eye, as well as other possible treatment modalities, including thermal laser therapy. Fellow eye involvement and risks were discussed with the patient. Upon the finding of wet age related macular degeneration, treatment will be offered. The treatment regimen is on a treat as needed basis with the intent to treat if necessary and extend interval of exams when possible. On average 1 out of 6 patients do not need lifetime therapy. However, the risk of recurrent disease is high for a lifetime.  Initially monthly, then periodic, examinations and evaluations will determine whether the next treatment is required on the day of the examination.  OD, at 9-week postinjection, repeat injection today and follow-up next in 11 weeks

## 2021-07-13 NOTE — Progress Notes (Signed)
07/13/2021     CHIEF COMPLAINT Patient presents for  Chief Complaint  Patient presents with   Retina Follow Up      HISTORY OF PRESENT ILLNESS: Barbara Mccoy is a 87 y.o. female who presents to the clinic today for:   HPI     Retina Follow Up           Diagnosis: Wet AMD   Laterality: right eye   Onset: 9 weeks ago   Severity: mild   Duration: 9 weeks   Course: stable         Comments   9 week fu OU and oct and Avastin OD Pt states VA OU stable since last visit. Pt denies FOL, floaters, or ocular pain OU.  Pt states, "I am happy to report that I do not have any real changes in my vision."      Last edited by Kendra Opitz, COA on 07/13/2021  1:25 PM.      Referring physician: Burnard Bunting, MD [place],  [place] [address]  HISTORICAL INFORMATION:   Selected notes from the [place]: No current outpatient medications on file. (Ophthalmic Drugs)   No current facility-administered medications for this visit. (Ophthalmic Drugs)   Current Outpatient Medications (Other)  Medication Sig   apixaban (ELIQUIS) 5 MG TABS tablet TAKE ONE TABLET BY MOUTH TWICE DAILY   cholecalciferol (VITAMIN D3) 25 MCG (1000 UNIT) tablet Take 1,000 Units by mouth daily.   influenza vaccine adjuvanted (FLUAD) 0.5 ML injection Inject into the muscle.   MAGNESIUM CITRATE PO Take 250 mg by mouth daily.   Multiple Minerals-Vitamins (GNP CAL MAG ZINC +D3 PO) Take 1 tablet by mouth in the morning and at bedtime.   Multiple Vitamins-Minerals (PRESERVISION AREDS 2+MULTI VIT PO) Take 1 capsule by mouth in the morning and at bedtime.   simvastatin (ZOCOR) 20 MG tablet Take 20 mg by mouth daily.   No current facility-administered medications for this visit. (Other)      REVIEW OF SYSTEMS:    ALLERGIES Allergies  Allergen Reactions   Codeine Rash    PAST MEDICAL HISTORY Past Medical History:  Diagnosis Date   Cataract     bilateral   Irritable bowel 1950   type 3C   Macular degeneration, wet ([place])    bilateral, receives injections every 8-12 weeks   Migraine headache 1950   "fewer in old age"   Squamous cell carcinoma of skin of chest 08/05/2020   Past Surgical History:  Procedure Laterality Date   BREAST LUMPECTOMY WITH RADIOACTIVE SEED LOCALIZATION Left 01/02/2021   Procedure: LEFT BREAST LUMPECTOMY WITH RADIOACTIVE SEED LOCALIZATION;  Surgeon: Erroll Luna, MD;  Location: [facility];  Service: General;  Laterality: Left;   CARDIOVERSION N/A 01/30/2021   Procedure: CARDIOVERSION;  Surgeon: Geralynn Rile, MD;  Location: [facility];  Service: Cardiovascular;  Laterality: N/A;   CATARACT EXTRACTION, BILATERAL     INGUINAL HERNIA REPAIR     in 22's   SKIN CANCER EXCISION     TONSILLECTOMY     in childhood   TOTAL ABDOMINAL HYSTERECTOMY     w/ BSO, in 74's    FAMILY HISTORY Family History  Problem Relation Age of Onset   Brain cancer Father     SOCIAL HISTORY Social History   Tobacco Use   Smoking status: Never   Smokeless tobacco: Never  Substance Use Topics  Alcohol use: Yes    Alcohol/week: 5.0 standard drinks    Types: 4 Glasses of wine, 1 Shots of liquor per week   Drug use: Never         OPHTHALMIC EXAM:  Base Eye Exam     Visual Acuity (Snellen - Linear)       Right Left   Dist cc 20/20 -1 20/20    Correction: Glasses         Tonometry (Tonopen, 1:28 PM)       Right Left   Pressure 12 12         Pupils       Pupils Shape React APD   Right PERRL Round Brisk None   Left PERRL Round Brisk None         Visual Fields (Counting fingers)       Left Right    Full Full         Extraocular Movement       Right Left    Full, Ortho Full, Ortho         Neuro/Psych     Oriented x3: Yes   Mood/Affect: Normal         Dilation     Both eyes: 1.0% Mydriacyl, 2.5% Phenylephrine @ 1:28 PM           Slit Lamp and  Fundus Exam     External Exam       Right Left   External Normal Normal         Slit Lamp Exam       Right Left   Lids/Lashes Normal Normal   Conjunctiva/Sclera White and quiet White and quiet   Cornea Clear Clear   Anterior Chamber Deep and quiet Deep and quiet   Iris Round and reactive Round and reactive   Lens Posterior chamber intraocular lens, Open posterior capsule Posterior chamber intraocular lens   Anterior Vitreous Normal Normal         Fundus Exam       Right Left   Posterior Vitreous Posterior vitreous detachment partial Normal   Disc Normal Normal   C/D Ratio 0.4 0.45   Macula Retinal pigment epithelial mottling, no exudates, Hard drusen, , Retinal pigment epithelial atrophy, Early age related macular degeneration, Soft drusen, no macular thickening Atrophy, Retinal pigment epithelial atrophy, Age related macular degeneration, Early age related macular degeneration, Drusen, Soft drusen, no hemorrhage   Vessels Normal Normal   Periphery Normal Normal            IMAGING AND PROCEDURES  Imaging and Procedures for 07/13/21  OCT, Retina - OU - Both Eyes       Right Eye Quality was good. Scan locations included subfoveal. Central Foveal Thickness: 240. Progression has been stable. Findings include abnormal foveal contour, vitreomacular adhesion , cystoid macular edema.   Left Eye Quality was good. Scan locations included subfoveal. Central Foveal Thickness: 243. Progression has improved. Findings include abnormal foveal contour, vitreomacular adhesion .   Notes OD with persistent partial VM traction no inner foveal macular changes.  OD, 9 weeks postinjection.     OS, now at 7 months postinjection, no signs of abnormal CNVM     Intravitreal Injection, Pharmacologic Agent - OD - Right Eye       Time Out 07/13/2021. 1:49 PM. Confirmed correct patient, procedure, site, and patient consented.   Anesthesia Topical anesthesia was used. Anesthetic  medications included Lidocaine 4%.   Procedure Preparation included Tobramycin  0.3%, Ofloxacin , 10% betadine to eyelids, 5% betadine to ocular surface. A 30 gauge needle was used.   Injection: 2.5 mg bevacizumab 2.5 MG/0.1ML   Route: Intravitreal, Site: Right Eye   NDC: [phone], Lot: [id number]   Post-op Post injection exam found visual acuity of at least counting fingers. The patient tolerated the procedure well. There were no complications. The patient received written and verbal post procedure care education. Post injection medications included ocuflox.              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  Forms of therapy reviewed include the use of Anti-VEGF medications injected painlessly into the eye, as well as other possible treatment modalities, including thermal laser therapy. Fellow eye involvement and risks were discussed with the patient. Upon the finding of wet age related macular degeneration, treatment will be offered. The treatment regimen is on a treat as needed basis with the intent to treat if necessary and extend interval of exams when possible. On average 1 out of 6 patients do not need lifetime therapy. However, the risk of recurrent disease is high for a lifetime.  Initially monthly, then periodic, examinations and evaluations will determine whether the next treatment is required on the day of the examination.  OD, at 9-week postinjection, repeat injection today and follow-up next in 11 weeks     ICD-10-CM   1. Exudative age-related macular degeneration of right eye with active choroidal neovascularization (HCC)  H35.3211 OCT, Retina - OU -  Both Eyes    Intravitreal Injection, Pharmacologic Agent - OD - Right Eye    bevacizumab (AVASTIN) SOSY 2.5 mg    2. 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  OS stable overall currently 7 months post most recent injection, with no signs of recurrence x4 and half months off therapy  2.  OD currently at 9-week follow-up.  Repeat injection today to maintain quiescent's and resolution of disease and prevent recurrence before extending of interval examination next to 11-week  3.  Ophthalmic Meds Ordered this visit:  Meds ordered this encounter  Medications   bevacizumab (AVASTIN) SOSY 2.5 mg       Return in about 11 weeks (around 09/28/2021) for DILATE OU, AVASTIN OCT, OD.  There are no Patient Instructions on file for this visit.   Explained the diagnoses, plan, and follow up with the patient and they expressed understanding.  Patient expressed understanding of the importance of proper follow up care.   Clent Demark Orion Vandervort M.D. Diseases & Surgery of the Retina and Vitreous Retina & Diabetic [place] 07/13/21     Abbreviations: M myopia (nearsighted); A astigmatism; H hyperopia (farsighted); P presbyopia; Mrx spectacle prescription;  CTL contact lenses; OD right eye; OS left eye; OU both eyes  XT exotropia; ET esotropia; PEK punctate epithelial keratitis; PEE punctate epithelial erosions; DES dry eye syndrome; MGD meibomian gland dysfunction; ATs artificial tears; PFAT's preservative free artificial tears; [place] nuclear sclerotic cataract; PSC posterior subcapsular cataract; ERM epi-retinal membrane; PVD posterior vitreous detachment; RD retinal detachment; DM diabetes mellitus; DR diabetic retinopathy; NPDR non-proliferative diabetic retinopathy; PDR proliferative diabetic retinopathy; CSME clinically significant macular edema; DME diabetic macular  edema; dbh dot blot hemorrhages; CWS cotton wool spot; POAG  primary open angle glaucoma; C/D cup-to-disc ratio; HVF humphrey visual field; GVF goldmann visual field; OCT optical coherence tomography; IOP intraocular pressure; BRVO Branch retinal vein occlusion; CRVO central retinal vein occlusion; CRAO central retinal artery occlusion; BRAO branch retinal artery occlusion; RT retinal tear; SB scleral buckle; PPV pars plana vitrectomy; VH Vitreous hemorrhage; PRP panretinal laser photocoagulation; IVK intravitreal kenalog; VMT vitreomacular traction; MH Macular hole;  NVD neovascularization of the disc; NVE neovascularization elsewhere; AREDS age related eye disease study; ARMD age related macular degeneration; POAG primary open angle glaucoma; EBMD epithelial/anterior basement membrane dystrophy; ACIOL anterior chamber intraocular lens; IOL intraocular lens; PCIOL posterior chamber intraocular lens; Phaco/IOL phacoemulsification with intraocular lens placement; [place] photorefractive keratectomy; LASIK laser assisted in situ keratomileusis; HTN hypertension; DM diabetes mellitus; COPD chronic obstructive pulmonary disease

## 2021-08-14 ENCOUNTER — Other Ambulatory Visit: Payer: Self-pay | Admitting: Cardiology

## 2021-08-14 NOTE — Telephone Encounter (Signed)
Prescription refill request for Eliquis received. Indication:Afib Last office visit:8/22 Scr:0.6 Age: 88 Weight:75.9 kg  Prescription refilled

## 2021-08-21 ENCOUNTER — Telehealth: Payer: Self-pay | Admitting: *Deleted

## 2021-09-14 ENCOUNTER — Ambulatory Visit (HOSPITAL_COMMUNITY): Payer: Medicare Other | Attending: Cardiology

## 2021-09-14 ENCOUNTER — Other Ambulatory Visit: Payer: Self-pay

## 2021-09-14 DIAGNOSIS — I4891 Unspecified atrial fibrillation: Secondary | ICD-10-CM | POA: Insufficient documentation

## 2021-09-14 LAB — ECHOCARDIOGRAM COMPLETE: S' Lateral: 2.6 cm

## 2021-09-15 NOTE — Progress Notes (Signed)
Cardiology Office Note:    Date:  09/21/2021   ID:  Barbara Mccoy, DOB [birth year], MRN [id number]  PCP:  Burnard Bunting, MD  Cardiologist:  Donato Heinz, MD  Electrophysiologist:  None   Referring MD: Burnard Bunting, MD   No chief complaint on file.     History of Present Illness:    Barbara Mccoy is a 88 y.o. female with a hx of breast cancer, recently diagnosed atrial fibrillation who presents for follow-up.  On 01/02/2021 she presented for lumpectomy and found to be in atrial fibrillation.  She was asymptomatic.  Underwent successful surgery.   She denies any history of A. fib but reports she saw a cardiologist years ago for palpitations and was started on atenolol at that time.  Has been off atenolol for years. Since then, and until her lumpectomy, she had no indication of Afib. Lately, she has occasional palpitations that she describes as "inconsequential" and lasts for a few seconds. She is unsure of how frequent they are, as they cause her no distress and are difficult to detect. The past few days she has felt more fatigued, and notes she has been taking 6 exercise classes a week (1 per day). During her surgery, they performed a lumpectomy only and will treat with radiation therapy in July. She has never been a smoker. Her father had a TIA in his 10's. She denies any chest pain, shortness of breath, or exertional symptoms. No headaches, lightheadedness, or syncope to report. Also has no lower extremity edema, orthopnea or PND. She denies any history of hematuria or blood in her stool, and no bleeding from her surgical site.  Echocardiogram on 01/13/2021 showed normal biventricular function, no significant valvular disease.  Underwent successful cardioversion on 01/30/2021, but was back in A-fib at follow-up appointment 03/2021.  Echocardiogram 09/14/2021 showed EF 60 to 65%, normal RV function, mild to moderate MR.  Since last clinic visit, she reports that she has been doing  well.  Brought home BP log, has been 120s to 160s over 70s to 80s.  Did describe some chest pain 1 night that she attributed to indigestion, lasted about 20 minutes.  She does aerobic exercise 3 days/week for 35 to 40 minutes, denies any exertional symptoms.  Reports occasional lightheadedness with standing, denies any syncope.  No lower extremity edema.  Rare palpitations.  Denies any bleeding issues on Eliquis.    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, receives injections every 8-12 weeks   Migraine headache 1950   "fewer in old age"   Squamous cell carcinoma of skin of chest 08/05/2020    Past Surgical History:  Procedure Laterality Date   BREAST LUMPECTOMY WITH RADIOACTIVE SEED LOCALIZATION Left 01/02/2021   Procedure: LEFT BREAST LUMPECTOMY WITH RADIOACTIVE SEED LOCALIZATION;  Surgeon: Erroll Luna, MD;  Location: [facility];  Service: General;  Laterality: Left;   CARDIOVERSION N/A 01/30/2021   Procedure: CARDIOVERSION;  Surgeon: Geralynn Rile, MD;  Location: [facility];  Service: Cardiovascular;  Laterality: N/A;   CATARACT EXTRACTION, BILATERAL     INGUINAL HERNIA REPAIR     in 26's   SKIN CANCER EXCISION     TONSILLECTOMY     in  childhood   TOTAL ABDOMINAL HYSTERECTOMY     w/ BSO, in 40's    Current Medications: Current Meds  Medication Sig   cholecalciferol (VITAMIN D3) 25 MCG (1000 UNIT) tablet Take 1,000 Units by mouth daily.   ELIQUIS 5 MG TABS tablet TAKE 1 TABLET BY MOUTH TWICE DAILY   losartan (COZAAR) 25 MG tablet Take 1 tablet (25 mg total) by mouth daily.   Multiple Minerals-Vitamins (GNP CAL MAG ZINC +D3 PO) Take 1 tablet by mouth in the morning and at bedtime.   Multiple Vitamins-Minerals (PRESERVISION AREDS 2+MULTI VIT PO)  Take 1 capsule by mouth in the morning and at bedtime.   simvastatin (ZOCOR) 20 MG tablet Take 20 mg by mouth daily.     Allergies:   Codeine   Social History   Socioeconomic History   Marital status: Widowed    Spouse name: Not on file   Number of children: Not on file   Years of education: Not on file   Highest education level: Not on file  Occupational History   Not on file  Tobacco Use   Smoking status: Never   Smokeless tobacco: Never  Substance and Sexual Activity   Alcohol use: Yes    Alcohol/week: 5.0 standard drinks    Types: 4 Glasses of wine, 1 Shots of liquor per week   Drug use: Never   Sexual activity: Not on file  Other Topics Concern   Not on file  Social History Narrative   Not on file   Social Determinants of Health   Financial Resource Strain: Low Risk    Difficulty of Paying Living Expenses: Not hard at all  Food Insecurity: No Food Insecurity   Worried About Charity fundraiser in the Last Year: Never true   [place] in the Last Year: Never true  Transportation Needs: No Transportation Needs   Lack of Transportation (Medical): No   Lack of Transportation (Non-Medical): No  Physical Activity: Not on file  Stress: Not on file  Social Connections: Not on file     Family History: The patient's family history includes Brain cancer in her father.  ROS:   Please see the history of present illness.     All other systems reviewed and are negative.  EKGs/Labs/Other Studies Reviewed:    The following studies were reviewed today:  Echo 01/13/2021:  1. Left ventricular ejection fraction, by estimation, is 60 to 65%. The  left ventricle has normal function. The left ventricle has no regional  wall motion abnormalities. There is mild left ventricular hypertrophy.  Left ventricular diastolic function  could not be evaluated.   2. Right ventricular systolic function is low normal. The right  ventricular size is normal. There is normal  pulmonary artery systolic  pressure. The estimated right ventricular systolic pressure is 27.0 mmHg.   3. Right atrial size was top normal.   4. The mitral valve is abnormal. Trivial mitral valve regurgitation.   5. The aortic valve is tricuspid. Aortic valve regurgitation is not  visualized.   6. The inferior vena cava is dilated in size with >50% respiratory  variability, suggesting right atrial pressure of 8 mmHg.   Comparison(s): No prior Echocardiogram.  US Carotid Duplex 04/01/2020: Right Carotid: Velocities in the right ICA are consistent with a 1-39%  stenosis.   Left Carotid: Velocities in the left ICA are consistent with a 1-39%  stenosis.   Vertebrals:  Bilateral vertebral arteries demonstrate antegrade flow.  Subclavians:  Normal flow hemodynamics were seen in bilateral subclavian arteries.   EKG:   09/16/21: Afib, rate 74,Q wAVES IN v1/2 03/03/21: Afib, low voltage, rate 74, Q waves in V1/2 01/23/2021: Atrial flutter with variable conduction, rate 65, Q waves in V1/2 01/05/2021: Atrial flutter with variable conduction, rate 83, Q waves in V1/2  Recent Labs: 12/26/2020: ALT 23 01/23/2021: BUN 12; Creatinine, Ser 0.69; Hemoglobin 16.0; Platelets 195; Potassium 4.5; Sodium 139  Recent Lipid Panel No results found for: CHOL, TRIG, HDL, CHOLHDL, VLDL, LDLCALC, LDLDIRECT  Physical Exam:    VS:  BP (!) 160/100    Pulse 74    Ht 5\' 5"  (1.651 m)    Wt 170 lb 6.4 oz (77.3 kg)    LMP  (LMP Unknown)    SpO2 95%    BMI 28.36 kg/m     Wt Readings from Last 3 Encounters:  09/16/21 170 lb 6.4 oz (77.3 kg)  05/27/21 167 lb 4.8 oz (75.9 kg)  03/03/21 167 lb 12.8 oz (76.1 kg)     GEN: Well nourished, well developed in no acute distress HEENT: Normal NECK: No JVD; No carotid bruits LYMPHATICS: No lymphadenopathy CARDIAC: Irregular rhythm,normal rate, no murmurs, rubs, gallops RESPIRATORY:  Clear to auscultation without rales, wheezing or rhonchi  ABDOMEN: Soft, non-tender,  non-distended MUSCULOSKELETAL:  No edema; No deformity  SKIN: Warm and dry NEUROLOGIC:  Alert and oriented x 3 PSYCHIATRIC:  Normal affect   ASSESSMENT:    1. Atrial fibrillation, unspecified type ([place])   2. Medication management   3. Essential hypertension   4. Hyperlipidemia, unspecified hyperlipidemia type       PLAN:    Atrial fibrillation/flutter: New diagnosis 12/2020.  She is asymptomatic.  CHA2DS2-VASc score 3 (age x2, female).  Echocardiogram on 01/13/2021 showed normal biventricular function, no significant valvular disease.  Underwent successful cardioversion on 01/30/2021 but back in Afib at f/u appointment 03/2021.  Echocardiogram 09/14/2021 showed EF 60 to 65%, normal RV function, mild to moderate MR. -Given she is rate controlled and asymptomatic and failed prior cardioversion, recommend rate control strategy.  -Zio patch x3 days showed low rates (average rate 64) and pauses up to 3 seconds.  She felt poorly since starting metoprolol, suspect this is due to bradycardia.  Metoprolol discontinued.  Rates appear well controlled off of metoprolol -Continue Eliquis 5 mg twice daily.    Hyperlipidemia: On simvastatin 20 mg daily  Hypertension: BP elevated in clinic today and on home BP log.  Will start losartan 25 mg daily and check BMET in 1 week.  Asked to check BP daily for next 2 weeks and call with results  RTC in 6 months  Medication Adjustments/Labs and Tests Ordered: Current medicines are reviewed at length with the patient today.  Concerns regarding medicines are outlined above.  Orders Placed This Encounter  Procedures   Basic Metabolic Panel (BMET)   EKG 12-Lead     Meds ordered this encounter  Medications   losartan (COZAAR) 25 MG tablet    Sig: Take 1 tablet (25 mg total) by mouth daily.    Dispense:  90 tablet    Refill:  3     Patient Instructions  Medication Instructions:  Your physician has recommended you make the following change in your  medication:  START: Losartan 25 mg once daily Please take your blood pressure daily and record call or send a MyChart message in 2 weeks. Please include heart rates.  *If you need a refill on your cardiac  medications before your next appointment, please call your pharmacy*   Lab Work: Your physician recommends that you return for lab work in:  IN 1 WEEK: BMET If you have labs (blood work) drawn today and your tests are completely normal, you will receive your results only by: [place] (if you have MyChart) OR A paper copy in the mail If you have any lab test that is abnormal or we need to change your treatment, we will call you to review the results.   Testing/Procedures: None   Follow-Up: At [facility], you and your health needs are our priority.  As part of our continuing mission to provide you with exceptional heart care, we have created designated Provider Care Teams.  These Care Teams include your primary Cardiologist (physician) and Advanced Practice Providers (APPs -  Physician Assistants and Nurse Practitioners) who all work together to provide you with the care you need, when you need it.  We recommend signing up for the patient portal called "MyChart".  Sign up information is provided on this After Visit Summary.  MyChart is used to connect with patients for Virtual Visits (Telemedicine).  Patients are able to view lab/test results, encounter notes, upcoming appointments, etc.  Non-urgent messages can be sent to your provider as well.   To learn more about what you can do with MyChart, go to [email or web address].    Your next appointment:   6 month(s)  The format for your next appointment:   In Person  Provider:   Donato Heinz, MD        Signed, Donato Heinz, MD  09/21/2021 8:38 PM    [place]

## 2021-09-16 ENCOUNTER — Encounter: Payer: Self-pay | Admitting: Cardiology

## 2021-09-16 ENCOUNTER — Other Ambulatory Visit: Payer: Self-pay

## 2021-09-16 ENCOUNTER — Ambulatory Visit (INDEPENDENT_AMBULATORY_CARE_PROVIDER_SITE_OTHER): Payer: Medicare Other | Admitting: Cardiology

## 2021-09-16 VITALS — BP 160/100 | HR 74 | Ht 65.0 in | Wt 170.4 lb

## 2021-09-16 DIAGNOSIS — Z79899 Other long term (current) drug therapy: Secondary | ICD-10-CM

## 2021-09-16 DIAGNOSIS — E785 Hyperlipidemia, unspecified: Secondary | ICD-10-CM

## 2021-09-16 DIAGNOSIS — I4891 Unspecified atrial fibrillation: Secondary | ICD-10-CM

## 2021-09-16 DIAGNOSIS — I10 Essential (primary) hypertension: Secondary | ICD-10-CM | POA: Diagnosis not present

## 2021-09-16 MED ORDER — LOSARTAN POTASSIUM 25 MG PO TABS: 25.0000 mg | ORAL_TABLET | Freq: Every day | ORAL | 3 refills | Status: DC

## 2021-09-16 NOTE — Patient Instructions (Signed)
Medication Instructions:  Your physician has recommended you make the following change in your medication:  START: Losartan 25 mg once daily Please take your blood pressure daily and record call or send a MyChart message in 2 weeks. Please include heart rates.  *If you need a refill on your cardiac medications before your next appointment, please call your pharmacy*   Lab Work: Your physician recommends that you return for lab work in:  IN 1 WEEK: BMET If you have labs (blood work) drawn today and your tests are completely normal, you will receive your results only by: [employer] (if you have MyChart) OR A paper copy in the mail If you have any lab test that is abnormal or we need to change your treatment, we will call you to review the results.   Testing/Procedures: None   Follow-Up: At [facility], you and your health needs are our priority.  As part of our continuing mission to provide you with exceptional heart care, we have created designated Provider Care Teams.  These Care Teams include your primary Cardiologist (physician) and Advanced Practice Providers (APPs -  Physician Assistants and Nurse Practitioners) who all work together to provide you with the care you need, when you need it.  We recommend signing up for the patient portal called "MyChart".  Sign up information is provided on this After Visit Summary.  MyChart is used to connect with patients for Virtual Visits (Telemedicine).  Patients are able to view lab/test results, encounter notes, upcoming appointments, etc.  Non-urgent messages can be sent to your provider as well.   To learn more about what you can do with MyChart, go to [email or web address].    Your next appointment:   6 month(s)  The format for your next appointment:   In Person  Provider:   Donato Heinz, MD

## 2021-09-17 ENCOUNTER — Telehealth: Payer: Self-pay | Admitting: *Deleted

## 2021-09-17 ENCOUNTER — Telehealth: Payer: Medicare Other | Admitting: *Deleted

## 2021-09-18 ENCOUNTER — Other Ambulatory Visit: Payer: Self-pay

## 2021-09-18 ENCOUNTER — Inpatient Hospital Stay: Payer: Medicare Other | Attending: Adult Health | Admitting: Adult Health

## 2021-09-18 DIAGNOSIS — D0512 Intraductal carcinoma in situ of left breast: Secondary | ICD-10-CM

## 2021-09-18 NOTE — Progress Notes (Signed)
SURVIVORSHIP VIRTUAL VISIT:  I connected with Barbara Mccoy on 09/18/21 at  8:15 AM EST by telephone and verified that I am speaking with the correct person using two identifiers.  I discussed the limitations, risks, security and privacy concerns of performing an evaluation and management service by telephone and the availability of in person appointments. I also discussed with the patient that there may be a patient responsible charge related to this service. The patient expressed understanding and agreed to proceed.  Patient location: at home in [place], [place] Provider location: [facility], provider office Others participating: none  BRIEF ONCOLOGIC HISTORY:  Oncology History  Ductal carcinoma in situ (DCIS) of left breast  12/19/2020 Initial Diagnosis   Ductal carcinoma in situ (DCIS) of left breast   01/02/2021 Definitive Surgery   FINAL MICROSCOPIC DIAGNOSIS:   A. BREAST, LEFT, LUMPECTOMY:  - Low grade ductal carcinoma in situ, spanning approximately 1.5 cm.  - Lobular neoplasia (atypical lobular hyperplasia).  - Biopsy site.  - Final margins (parts B-D) are negative.  - See oncology table.   B. BREAST, LEFT ADDITIONAL ANTEROSUPERIOR MARGIN, EXCISION:  - Benign breast tissue.   C. BREAST, LEFT ADDITIONAL POSTEROMEDIAL MARGIN, EXCISION:  - Benign breast tissue.   D. BREAST, LEFT ADDITIONAL LATEROINFERIOR MARGIN, EXCISION:  - Benign breast tissue.   ONCOLOGY TABLE:   DCIS OF THE BREAST:  Resection   Procedure: Left breast lumpectomy with additional margins  Specimen Laterality: Left  Histologic Type: Ductal carcinoma in situ  Size of DCIS: Approximately 1.5 cm  Nuclear Grade: Low-grade, see comment  Necrosis: Not identified  Margins: All final margins negative for DCIS       Specify Closest Margin (required only if <47mm): >10 mm  Regional Lymph Nodes:  No lymph nodes sampled or submitted  Breast Biomarker Testing Performed on Previous Biopsy:       Testing performed on Case  Number: [id number]       Estrogen Receptor: Positive, greater than 95%, strong staining       Progesterone Receptor: Positive, 20%, strong staining  Pathologic Stage Classification (pTNM, AJCC 8th Edition): pTis, pN not  assigned  Representative Tumor Block: A1  Comment(s): Cytokeratin 5/6 is negative in areas of DCIS. Compared to  the biopsy, the current DCIS is low grade. Residual high grade DCIS is  not seen. E-cadherin is negative in areas of [facility].    02/12/2021 - 03/06/2021 Radiation Therapy   Plan ID Fractions First Treatment Last Treatment  Dose  Breast_Lt_BH 16 / 16 02/12/2021 03/06/2021 42.56Gy/ 42.56Gy        INTERVAL HISTORY:  Barbara Mccoy to review her survivorship care plan detailing her treatment course for breast cancer, as well as monitoring long-term side effects of that treatment, education regarding health maintenance, screening, and overall wellness and health promotion.     Overall, Barbara Mccoy reports feeling quite well   REVIEW OF SYSTEMS:  Review of Systems  Constitutional:  Negative for appetite change, chills, fatigue, fever and unexpected weight change.  HENT:   Negative for hearing loss, lump/mass and trouble swallowing.   Eyes:  Negative for eye problems and icterus.  Respiratory:  Negative for chest tightness, cough and shortness of breath.   Cardiovascular:  Negative for chest pain, leg swelling and palpitations.  Gastrointestinal:  Negative for abdominal distention, abdominal pain, constipation, diarrhea, nausea and vomiting.  Endocrine: Negative for hot flashes.  Genitourinary:  Negative for difficulty urinating.   Musculoskeletal:  Negative for arthralgias.  Skin:  Negative for  itching and rash.  Neurological:  Negative for dizziness, extremity weakness, headaches and numbness.  Hematological:  Negative for adenopathy. Does not bruise/bleed easily.  Psychiatric/Behavioral:  Negative for depression. The patient is not nervous/anxious.   Breast: Denies any new  nodularity, masses, tenderness, nipple changes, or nipple discharge.      ONCOLOGY TREATMENT TEAM:  1. Surgeon:  Dr. Brantley Stage at [facility] 2. Medical Oncologist: Dr. Lindi Adie  3. Radiation Oncologist: Dr. 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, receives injections every 8-12 weeks   Migraine headache 1950   "fewer in old age"   Squamous cell carcinoma of skin of chest 08/05/2020   Past Surgical History:  Procedure Laterality Date   BREAST LUMPECTOMY WITH RADIOACTIVE SEED LOCALIZATION Left 01/02/2021   Procedure: LEFT BREAST LUMPECTOMY WITH RADIOACTIVE SEED LOCALIZATION;  Surgeon: Erroll Luna, MD;  Location: [place];  Service: General;  Laterality: Left;   CARDIOVERSION N/A 01/30/2021   Procedure: CARDIOVERSION;  Surgeon: Geralynn Rile, MD;  Location: [place];  Service: Cardiovascular;  Laterality: N/A;   CATARACT EXTRACTION, BILATERAL     INGUINAL HERNIA REPAIR     in 40's   SKIN CANCER EXCISION     TONSILLECTOMY     in childhood   TOTAL ABDOMINAL HYSTERECTOMY     w/ BSO, in 40's     ALLERGIES:  Allergies  Allergen Reactions   Codeine Rash     CURRENT MEDICATIONS:  Outpatient Encounter Medications as of 09/18/2021  Medication Sig   cholecalciferol (VITAMIN D3) 25 MCG (1000 UNIT) tablet Take 1,000 Units by mouth daily.   ELIQUIS 5 MG TABS tablet TAKE 1 TABLET BY MOUTH TWICE DAILY   influenza vaccine adjuvanted (FLUAD) 0.5 ML injection Inject into the muscle.   losartan (COZAAR) 25 MG tablet Take 1 tablet (25 mg total) by mouth daily.   MAGNESIUM CITRATE PO Take 250 mg by mouth daily. (Patient not taking: Reported on 09/16/2021)   Multiple Minerals-Vitamins (GNP CAL MAG ZINC +D3 PO) Take 1 tablet by mouth in the morning and at bedtime.   Multiple Vitamins-Minerals (PRESERVISION  AREDS 2+MULTI VIT PO) Take 1 capsule by mouth in the morning and at bedtime.   simvastatin (ZOCOR) 20 MG tablet Take 20 mg by mouth daily.   No facility-administered encounter medications on file as of 09/18/2021.     ONCOLOGIC FAMILY HISTORY:  Family History  Problem Relation Age of Onset   Brain cancer Father      GENETIC COUNSELING/TESTING: Not at this time  SOCIAL HISTORY:  Social History   Socioeconomic History   Marital status: Widowed    Spouse name: Not on file   Number of children: Not on file   Years of education: Not on file   Highest education level: Not on file  Occupational History   Not on file  Tobacco Use   Smoking status: Never   Smokeless tobacco: Never  Substance and Sexual Activity   Alcohol use: Yes    Alcohol/week: 5.0 standard drinks    Types: 4 Glasses of wine, 1 Shots of liquor per week   Drug use: Never   Sexual activity: Not on file  Other Topics Concern   Not on file  Social History Narrative   Not on file   Social Determinants of  Health   Financial Resource Strain: Low Risk    Difficulty of Paying Living Expenses: Not hard at all  Food Insecurity: No Food Insecurity   Worried About [place] in the Last Year: Never true   Ran Out of Food in the Last Year: Never true  Transportation Needs: No Transportation Needs   Lack of Transportation (Medical): No   Lack of Transportation (Non-Medical): No  Physical Activity: Not on file  Stress: Not on file  Social Connections: Not on file  Intimate Partner Violence: Not on file     OBSERVATIONS/OBJECTIVE:  Patient sounds well.  She is in no apparent distress mood and behavior normal breathing is nonlabored.  LABORATORY DATA:  None for this visit.  DIAGNOSTIC IMAGING:  None for this visit.      ASSESSMENT AND PLAN:  Ms.. Mccoy is a pleasant 88 y.o. female with Stage 0 left breast DCIS, ER+/PR+, diagnosed in May 2022, treated with lumpectomy and adjuvant radiation therapy.   She presents to the Survivorship Clinic for our initial meeting and routine follow-up post-completion of treatment for breast cancer.    1. Stage 0 eft breast cancer:  Barbara Mccoy is continuing to recover from definitive treatment for breast cancer. She will follow-up with her medical oncologist, Dr. Lindi Adie in May 2023 with history and physical exam per surveillance protocol.   Her mammogram is due April 2023; orders placed today. Today, a comprehensive survivorship care plan and treatment summary was reviewed with the patient today detailing her breast cancer diagnosis, treatment course, potential late/long-term effects of treatment, appropriate follow-up care with recommendations for the future, and patient education resources.  A copy of this summary, along with a letter will be sent to the patients primary care provider via mail/fax/In Basket message after todays visit.    2. Bone health: She plans on undergoing bone density testing in April 2023.  This is managed by her primary care provider.  She was given education on specific activities to promote bone health and her survivorship care plan.  3. Cancer screening:  Due to Barbara Mccoy's history and her age, she should receive screening for skin cancers.  The information and recommendations are listed on the patient's comprehensive care plan/treatment summary and were reviewed in detail with the patient.    4. Health maintenance and wellness promotion: Barbara Mccoy was encouraged to consume 5-7 servings of fruits and vegetables per day. We reviewed the "Nutrition Rainbow" handout.  She was also encouraged to engage in moderate to vigorous exercise for 30 minutes per day most days of the week. We discussed the [employer] fitness program, which is designed for cancer survivors to help them become more physically fit after cancer treatments.  She was instructed to limit her alcohol consumption and continue to abstain from tobacco use.     5. Support  services/counseling: It is not uncommon for this period of the patient's cancer care trajectory to be one of many emotions and stressors.   She was given information regarding our available services and encouraged to contact me with any questions or for help enrolling in any of our support group/programs.    Follow up instructions:    -Return to cancer center in May 2023 for follow-up with Dr. Lindi Adie -Mammogram due in April 2023 -Follow up with surgery November 2023 -She is welcome to return back to the Survivorship Clinic at any time; no additional follow-up needed at this time.  -Consider referral back to survivorship as a  long-term survivor for continued surveillance  The patient was provided an opportunity to ask questions and all were answered. The patient agreed with the plan and demonstrated an understanding of the instructions.   The patient was advised to call back or seek an in-person evaluation if the symptoms worsen or if the condition fails to improve as anticipated.   I provided 21 minutes of non face-to-face telephone visit time during this encounter, and > 50% was spent counseling as documented under my assessment & plan.  Wilber Bihari, NP 09/18/21 8:17 AM Medical Oncology and Hematology [facility] [place], [place] [address] Tel. [phone]    Fax. [phone]

## 2021-09-28 ENCOUNTER — Ambulatory Visit (INDEPENDENT_AMBULATORY_CARE_PROVIDER_SITE_OTHER): Payer: Medicare Other | Admitting: Ophthalmology

## 2021-09-28 ENCOUNTER — Other Ambulatory Visit: Payer: Self-pay

## 2021-09-28 ENCOUNTER — Encounter (INDEPENDENT_AMBULATORY_CARE_PROVIDER_SITE_OTHER): Payer: Self-pay | Admitting: Ophthalmology

## 2021-09-28 DIAGNOSIS — H353211 Exudative age-related macular degeneration, right eye, with active choroidal neovascularization: Secondary | ICD-10-CM

## 2021-09-28 DIAGNOSIS — U071 COVID-19: Secondary | ICD-10-CM | POA: Diagnosis not present

## 2021-09-28 DIAGNOSIS — H43821 Vitreomacular adhesion, right eye: Secondary | ICD-10-CM

## 2021-09-28 DIAGNOSIS — H43822 Vitreomacular adhesion, left eye: Secondary | ICD-10-CM

## 2021-09-28 DIAGNOSIS — H353222 Exudative age-related macular degeneration, left eye, with inactive choroidal neovascularization: Secondary | ICD-10-CM | POA: Diagnosis not present

## 2021-09-28 MED ORDER — BEVACIZUMAB 2.5 MG/0.1ML IZ SOSY
2.5000 mg | PREFILLED_SYRINGE | INTRAVITREAL | Status: AC | PRN
  Administered 2021-09-28: 2.5 mg via INTRAVITREAL

## 2021-09-28 NOTE — Assessment & Plan Note (Signed)
OS, no active disease currently at 9 months post most recent injection

## 2021-09-28 NOTE — Assessment & Plan Note (Addendum)
Resolved

## 2021-09-28 NOTE — Assessment & Plan Note (Addendum)
The nature of wet macular degeneration was discussed with the patient.  Forms of therapy reviewed include the use of Anti-VEGF medications injected painlessly into the eye, as well as other possible treatment modalities, including thermal laser therapy. Fellow eye involvement and risks were discussed with the patient. Upon the finding of wet age related macular degeneration, treatment will be offered. The treatment regimen is on a treat as needed basis with the intent to treat if necessary and extend interval of exams when possible. On average 1 out of 6 patients do not need lifetime therapy. However, the risk of recurrent disease is high for a lifetime.  Initially monthly, then periodic, examinations and evaluations will determine whether the next treatment is required on the day of the examination.  OD today at 11 weeks post most recent injection thus repeat injection today due to history of multiple recurrences.  Next visit in 3 months

## 2021-09-28 NOTE — Assessment & Plan Note (Signed)
STABLE OS

## 2021-09-28 NOTE — Progress Notes (Signed)
09/28/2021     CHIEF COMPLAINT Patient presents for  Chief Complaint  Patient presents with   Macular Degeneration      HISTORY OF PRESENT ILLNESS: Barbara Mccoy is a 88 y.o. female who presents to the clinic today for:     Referring physician: Burnard Bunting, MD [place],  [place] [id number]  HISTORICAL INFORMATION:   Selected notes from the [place]: No current outpatient medications on file. (Ophthalmic Drugs)   No current facility-administered medications for this visit. (Ophthalmic Drugs)   Current Outpatient Medications (Other)  Medication Sig   cholecalciferol (VITAMIN D3) 25 MCG (1000 UNIT) tablet Take 1,000 Units by mouth daily.   ELIQUIS 5 MG TABS tablet TAKE 1 TABLET BY MOUTH TWICE DAILY   influenza vaccine adjuvanted (FLUAD) 0.5 ML injection Inject into the muscle.   losartan (COZAAR) 25 MG tablet Take 1 tablet (25 mg total) by mouth daily.   MAGNESIUM CITRATE PO Take 250 mg by mouth daily. (Patient not taking: Reported on 09/16/2021)   Multiple Minerals-Vitamins (GNP CAL MAG ZINC +D3 PO) Take 1 tablet by mouth in the morning and at bedtime.   Multiple Vitamins-Minerals (PRESERVISION AREDS 2+MULTI VIT PO) Take 1 capsule by mouth in the morning and at bedtime.   simvastatin (ZOCOR) 20 MG tablet Take 20 mg by mouth daily.   No current facility-administered medications for this visit. (Other)      REVIEW OF SYSTEMS: ROS   Negative for: Constitutional, Gastrointestinal, Neurological, Skin, Genitourinary, Musculoskeletal, HENT, Endocrine, Cardiovascular, Eyes, Respiratory, Psychiatric, Allergic/Imm, Heme/Lymph Last edited by Hurman Horn, MD on 09/28/2021  1:38 PM.       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,  receives injections every 8-12 weeks   Migraine headache 1950   "fewer in old age"   Squamous cell carcinoma of skin of chest 08/05/2020   Vitreomacular adhesion of right eye 11/08/2019   Past Surgical History:  Procedure Laterality Date   BREAST LUMPECTOMY WITH RADIOACTIVE SEED LOCALIZATION Left 01/02/2021   Procedure: LEFT BREAST LUMPECTOMY WITH RADIOACTIVE SEED LOCALIZATION;  Surgeon: Erroll Luna, MD;  Location: [place];  Service: General;  Laterality: Left;   CARDIOVERSION N/A 01/30/2021   Procedure: CARDIOVERSION;  Surgeon: Geralynn Rile, MD;  Location: [place];  Service: Cardiovascular;  Laterality: N/A;   CATARACT EXTRACTION, BILATERAL     INGUINAL HERNIA REPAIR     in 11's   SKIN CANCER EXCISION     TONSILLECTOMY     in childhood   TOTAL ABDOMINAL HYSTERECTOMY     w/ BSO, in 76's    FAMILY HISTORY Family History  Problem Relation Age of Onset   Brain cancer Father     SOCIAL HISTORY Social History   Tobacco Use   Smoking status: Never   Smokeless tobacco: Never  Substance Use Topics   Alcohol use: Yes    Alcohol/week: 5.0 standard drinks    Types: 4 Glasses of wine, 1 Shots of liquor per week   Drug use: Never         OPHTHALMIC EXAM:  Base Eye Exam     Visual Acuity (ETDRS)       Right Left  Dist cc 20/30 20/25   Dist ph cc 20/25 -2 20/25 +1    Correction: Glasses         Tonometry (Tonopen, 1:40 PM)       Right Left   Pressure 14 18         Neuro/Psych     Oriented x3: Yes   Mood/Affect: Normal         Dilation     Right eye: 1.0% Mydriacyl, 2.5% Phenylephrine @ 1:40 PM           Slit Lamp and Fundus Exam     External Exam       Right Left   External Normal Normal         Slit Lamp Exam       Right Left   Lids/Lashes Normal Normal   Conjunctiva/Sclera White and quiet White and quiet   Cornea Clear Clear   Anterior Chamber Deep and quiet Deep and quiet   Iris Round and reactive  Round and reactive   Lens Posterior chamber intraocular lens, Open posterior capsule Posterior chamber intraocular lens   Anterior Vitreous Normal Normal         Fundus Exam       Right Left   Posterior Vitreous Posterior vitreous detachment partial Normal   Disc Normal Normal   C/D Ratio 0.4 0.45   Macula Retinal pigment epithelial mottling, no exudates, Hard drusen, , Retinal pigment epithelial atrophy, Early age related macular degeneration, Soft drusen, no macular thickening Atrophy, Retinal pigment epithelial atrophy, Age related macular degeneration, Early age related macular degeneration, Drusen, Soft drusen, no hemorrhage   Vessels Normal Normal   Periphery Normal Normal            IMAGING AND PROCEDURES  Imaging and Procedures for 09/28/21  OCT, Retina - OU - Both Eyes       Right Eye Quality was good. Scan locations included subfoveal. Central Foveal Thickness: 242. Progression has been stable. Findings include abnormal foveal contour, vitreomacular adhesion , cystoid macular edema.   Left Eye Quality was good. Scan locations included subfoveal. Central Foveal Thickness: 238. Progression has improved. Findings include abnormal foveal contour, vitreomacular adhesion .   Notes OD with persistent partial VM traction no inner foveal macular changes.  OD, 11 weeks postinjection.     OS, now at 9 months postinjection, no signs of abnormal CNVM     Intravitreal Injection, Pharmacologic Agent - OD - Right Eye       Time Out 09/28/2021. 2:26 PM. Confirmed correct patient, procedure, site, and patient consented.   Anesthesia Topical anesthesia was used. Anesthetic medications included Lidocaine 4%.   Procedure Preparation included Tobramycin 0.3%, Ofloxacin , 10% betadine to eyelids, 5% betadine to ocular surface. A 30 gauge needle was used.   Injection: 2.5 mg bevacizumab 2.5 MG/0.1ML   Route: Intravitreal, Site: Right Eye   NDC: [phone], Lot: [id number]    Post-op Post injection exam found visual acuity of at least counting fingers. The patient tolerated the procedure well. There were no complications. The patient received written and verbal post procedure care education. Post injection medications included ocuflox.              ASSESSMENT/PLAN:  Exudative age-related macular degeneration of right eye with active choroidal neovascularization (HCC) The nature of wet macular degeneration was discussed with the patient.  Forms of therapy reviewed include the use of Anti-VEGF medications injected painlessly into the eye, as well as other possible  treatment modalities, including thermal laser therapy. Fellow eye involvement and risks were discussed with the patient. Upon the finding of wet age related macular degeneration, treatment will be offered. The treatment regimen is on a treat as needed basis with the intent to treat if necessary and extend interval of exams when possible. On average 1 out of 6 patients do not need lifetime therapy. However, the risk of recurrent disease is high for a lifetime.  Initially monthly, then periodic, examinations and evaluations will determine whether the next treatment is required on the day of the examination.  OD today at 11 weeks post most recent injection thus repeat injection today due to history of multiple recurrences.  Next visit in 3 months  Vitreomacular adhesion of right eye Resolved  Vitreomacular adhesion of left eye STABLE OS  Exudative age-related macular degeneration of left eye with inactive choroidal neovascularization (HCC) OS, no active disease currently at 9 months post most recent injection     ICD-10-CM   1. 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 Vitreomacular adhesion  of right eye  H43.821     3. Vitreomacular adhesion of left eye  H43.822     4. Exudative age-related macular degeneration of left eye with inactive choroidal neovascularization ([place])  H35.3222       1.  OD today at 11-week follow-up, repeat injection today due to a history of multiple recurrences.  Repeat dilated examination next in 3 months  2.  Dilate OU next, 3 months  3.  Ophthalmic Meds Ordered this visit:  Meds ordered this encounter  Medications   bevacizumab (AVASTIN) SOSY 2.5 mg       Return in about 3 months (around 12/26/2021) for DILATE OU, AVASTIN OCT, OD.  There are no Patient Instructions on file for this visit.   Explained the diagnoses, plan, and follow up with the patient and they expressed understanding.  Patient expressed understanding of the importance of proper follow up care.   Clent Demark Kaylyne Axton M.D. Diseases & Surgery of the Retina and Vitreous Retina & Diabetic [place] 09/28/21     Abbreviations: M myopia (nearsighted); A astigmatism; H hyperopia (farsighted); P presbyopia; Mrx spectacle prescription;  CTL contact lenses; OD right eye; OS left eye; OU both eyes  XT exotropia; ET esotropia; PEK punctate epithelial keratitis; PEE punctate epithelial erosions; DES dry eye syndrome; MGD meibomian gland dysfunction; ATs artificial tears; PFAT's preservative free artificial tears; [place] nuclear sclerotic cataract; PSC posterior subcapsular cataract; ERM epi-retinal membrane; PVD posterior vitreous detachment; RD retinal detachment; DM diabetes mellitus; DR diabetic retinopathy; NPDR non-proliferative diabetic retinopathy; PDR proliferative diabetic retinopathy; CSME clinically significant macular edema; DME diabetic macular edema; dbh dot blot hemorrhages; CWS cotton wool spot; POAG primary open angle glaucoma; C/D cup-to-disc ratio; HVF humphrey visual field; GVF goldmann visual field; OCT optical coherence tomography; IOP intraocular pressure; BRVO Branch  retinal vein occlusion; CRVO central retinal vein occlusion; CRAO central retinal artery occlusion; BRAO branch retinal artery occlusion; RT retinal tear; SB scleral buckle; PPV pars plana vitrectomy; VH Vitreous hemorrhage; PRP panretinal laser photocoagulation; IVK intravitreal kenalog; VMT vitreomacular traction; MH Macular hole;  NVD neovascularization of the disc; NVE neovascularization elsewhere; AREDS age related eye disease study; ARMD age related macular degeneration; POAG primary open angle glaucoma; EBMD  epithelial/anterior basement membrane dystrophy; ACIOL anterior chamber intraocular lens; IOL intraocular lens; PCIOL posterior chamber intraocular lens; Phaco/IOL phacoemulsification with intraocular lens placement; [place] photorefractive keratectomy; LASIK laser assisted in situ keratomileusis; HTN hypertension; DM diabetes mellitus; COPD chronic obstructive pulmonary disease

## 2021-09-29 DIAGNOSIS — M8580 Other specified disorders of bone density and structure, unspecified site: Secondary | ICD-10-CM | POA: Diagnosis not present

## 2021-09-29 DIAGNOSIS — E785 Hyperlipidemia, unspecified: Secondary | ICD-10-CM | POA: Diagnosis not present

## 2021-09-29 DIAGNOSIS — F329 Major depressive disorder, single episode, unspecified: Secondary | ICD-10-CM | POA: Diagnosis not present

## 2021-09-29 DIAGNOSIS — Z79899 Other long term (current) drug therapy: Secondary | ICD-10-CM | POA: Diagnosis not present

## 2021-09-29 DIAGNOSIS — K219 Gastro-esophageal reflux disease without esophagitis: Secondary | ICD-10-CM | POA: Diagnosis not present

## 2021-09-30 LAB — BASIC METABOLIC PANEL
BUN/Creatinine Ratio: 13 (ref 12–28)
BUN: 9 mg/dL (ref 8–27)
CO2: 21 mmol/L (ref 20–29)
Calcium: 10 mg/dL (ref 8.7–10.3)
Chloride: 102 mmol/L (ref 96–106)
Creatinine, Ser: 0.69 mg/dL (ref 0.57–1.00)
Glucose: 87 mg/dL (ref 70–99)
Potassium: 4.5 mmol/L (ref 3.5–5.2)
Sodium: 138 mmol/L (ref 134–144)
eGFR: 84 mL/min/{1.73_m2} (ref >59)

## 2021-10-14 ENCOUNTER — Telehealth: Payer: Self-pay | Admitting: *Deleted

## 2021-10-14 NOTE — Telephone Encounter (Signed)
BP log reviewed by Dr. Stefani Dama changes recommended at this time. ?

## 2021-11-10 ENCOUNTER — Encounter (HOSPITAL_COMMUNITY): Payer: Self-pay

## 2021-11-25 DIAGNOSIS — D692 Other nonthrombocytopenic purpura: Secondary | ICD-10-CM | POA: Diagnosis not present

## 2021-11-25 DIAGNOSIS — L570 Actinic keratosis: Secondary | ICD-10-CM | POA: Diagnosis not present

## 2021-11-25 DIAGNOSIS — D485 Neoplasm of uncertain behavior of skin: Secondary | ICD-10-CM | POA: Diagnosis not present

## 2021-11-25 DIAGNOSIS — L821 Other seborrheic keratosis: Secondary | ICD-10-CM | POA: Diagnosis not present

## 2021-11-25 DIAGNOSIS — D1801 Hemangioma of skin and subcutaneous tissue: Secondary | ICD-10-CM | POA: Diagnosis not present

## 2021-11-25 DIAGNOSIS — D225 Melanocytic nevi of trunk: Secondary | ICD-10-CM | POA: Diagnosis not present

## 2021-11-25 DIAGNOSIS — L814 Other melanin hyperpigmentation: Secondary | ICD-10-CM | POA: Diagnosis not present

## 2021-11-25 DIAGNOSIS — Z85828 Personal history of other malignant neoplasm of skin: Secondary | ICD-10-CM | POA: Diagnosis not present

## 2021-12-14 ENCOUNTER — Telehealth: Payer: Self-pay | Admitting: Cardiology

## 2021-12-14 NOTE — Telephone Encounter (Signed)
Agree with plan, would decrease losartan to 12.5 mg daily.  Check BP daily for next 2 weeks and call with results ?

## 2021-12-14 NOTE — Telephone Encounter (Signed)
Pt c/o medication issue: ? ?1. Name of Medication:  ? losartan (COZAAR) 25 MG tablet  ? ? ?2. How are you currently taking this medication (dosage and times per day)? Take 1 tablet (25 mg total) by mouth daily ? ?3. Are you having a reaction (difficulty breathing--STAT)? No ? ?4. What is your medication issue?  Pt states that medication is making her dizzy and light headed. Pt said that she would like to be taken off medication., Please advice ? ?

## 2021-12-14 NOTE — Telephone Encounter (Signed)
Spoke to patient she stated she has been taking Losartan 25 mg daily.She feels light headed,dizzy.B/P has been ranging 550 to 158 systolic and 68 diastolic.Stated she feels like 25 mg is too much.Advised to decrease take 25 mg 1/2 tablet daily.Advised Dr.Schumann is out of office today.I will send message to him for advice. ?

## 2021-12-17 NOTE — Telephone Encounter (Signed)
Spoke to patient, she reports "ataxia" since starting Losartan.   She decreased to 1/2 tablet 3-4 days ago and has not noticed any change, she would like to get off this medication.  BP has been 599J systolic, unable to tell me HR, states it fluctuates.  Patient is going to stop losartan to see if symptoms improve, monitor BP and HR, hydrate and call back is symptoms do not improve of BP becomes consistently elevated.  Patient verbalized understanding.    Routed to MD to review for further recommendations.

## 2021-12-17 NOTE — Progress Notes (Signed)
Patient Care Team: Burnard Bunting, MD as PCP - General (Internal Medicine) Donato Heinz, MD as PCP - Cardiology (Cardiology) Erroll Luna, MD as Consulting Physician (General Surgery) Eppie Gibson, MD as Attending Physician (Radiation Oncology) Rolm Bookbinder, MD as Consulting Physician (Dermatology) Zadie Rhine Clent Demark, MD as Consulting Physician (Ophthalmology) Nicholas Lose, MD as Consulting Physician (Hematology and Oncology)  DIAGNOSIS:  Encounter Diagnosis  Name Primary?   Ductal carcinoma in situ (DCIS) of left breast     SUMMARY OF ONCOLOGIC HISTORY: Oncology History  Ductal carcinoma in situ (DCIS) of left breast  12/19/2020 Initial Diagnosis   Ductal carcinoma in situ (DCIS) of left breast   01/02/2021 Definitive Surgery   FINAL MICROSCOPIC DIAGNOSIS:   A. BREAST, LEFT, LUMPECTOMY:  - Low grade ductal carcinoma in situ, spanning approximately 1.5 cm.  - Lobular neoplasia (atypical lobular hyperplasia).  - Biopsy site.  - Final margins (parts B-D) are negative.  - See oncology table.   B. BREAST, LEFT ADDITIONAL ANTEROSUPERIOR MARGIN, EXCISION:  - Benign breast tissue.   C. BREAST, LEFT ADDITIONAL POSTEROMEDIAL MARGIN, EXCISION:  - Benign breast tissue.   D. BREAST, LEFT ADDITIONAL LATEROINFERIOR MARGIN, EXCISION:  - Benign breast tissue.   ONCOLOGY TABLE:   DCIS OF THE BREAST:  Resection   Procedure: Left breast lumpectomy with additional margins  Specimen Laterality: Left  Histologic Type: Ductal carcinoma in situ  Size of DCIS: Approximately 1.5 cm  Nuclear Grade: Low-grade, see comment  Necrosis: Not identified  Margins: All final margins negative for DCIS       Specify Closest Margin (required only if <28m): >10 mm  Regional Lymph Nodes:  No lymph nodes sampled or submitted  Breast Biomarker Testing Performed on Previous Biopsy:       Testing performed on Case Number: [id number]       Estrogen Receptor: Positive, greater than 95%,  strong staining       Progesterone Receptor: Positive, 20%, strong staining  Pathologic Stage Classification (pTNM, AJCC 8th Edition): pTis, pN not  assigned  Representative Tumor Block: A1  Comment(s): Cytokeratin 5/6 is negative in areas of DCIS. Compared to  the biopsy, the current DCIS is low grade. Residual high grade DCIS is  not seen. E-cadherin is negative in areas of [facility]    02/12/2021 - 03/06/2021 Radiation Therapy   Plan ID Fractions First Treatment Last Treatment  Dose  Breast_Lt_BH 16 / 16 02/12/2021 03/06/2021 42.56Gy/ 42.56Gy        CHIEF COMPLIANT: Follow up on surveillance for DCIS left breast  INTERVAL HISTORY: Barbara GAMBINOis a 88y.o. on surveillance. She presents to the clinic today for a annual follow-up.  She denies any lumps or nodules in the breast.   ALLERGIES:  is allergic to codeine.  MEDICATIONS:  Current Outpatient Medications  Medication Sig Dispense Refill   cholecalciferol (VITAMIN D3) 25 MCG (1000 UNIT) tablet Take 1,000 Units by mouth daily.     ELIQUIS 5 MG TABS tablet TAKE 1 TABLET BY MOUTH TWICE DAILY 180 tablet 1   influenza vaccine adjuvanted (FLUAD) 0.5 ML injection Inject into the muscle. 0.5 mL 0   MAGNESIUM CITRATE PO Take 250 mg by mouth daily. (Patient not taking: Reported on 09/16/2021)     Multiple Minerals-Vitamins (GNP CAL MAG ZINC +D3 PO) Take 1 tablet by mouth in the morning and at bedtime.     Multiple Vitamins-Minerals (PRESERVISION AREDS 2+MULTI VIT PO) Take 1 capsule by mouth in the morning and at bedtime.  simvastatin (ZOCOR) 20 MG tablet Take 20 mg by mouth daily.     No current facility-administered medications for this visit.    PHYSICAL EXAMINATION: ECOG PERFORMANCE STATUS: 1 - Symptomatic but completely ambulatory  Vitals:   12/24/21 1030  BP: (!) 161/86  Pulse: 81  Resp: 17  Temp: 97.9 F (36.6 C)  SpO2: 99%   Filed Weights   12/24/21 1030  Weight: 158 lb 9.6 oz (71.9 kg)    BREAST: No palpable masses  or nodules in either right or left breasts. No palpable axillary supraclavicular or infraclavicular adenopathy no breast tenderness or nipple discharge. (exam performed in the presence of a chaperone)  LABORATORY DATA:  I have reviewed the data as listed    Latest Ref Rng & Units 09/29/2021    2:23 PM 01/23/2021   11:03 AM 12/26/2020    3:59 PM  CMP  Glucose 70 - 99 mg/dL 87   95   101    BUN 8 - 27 mg/dL '9   12   10    '$ Creatinine 0.57 - 1.00 mg/dL 0.69   0.69   0.68    Sodium 134 - 144 mmol/L 138   139   135    Potassium 3.5 - 5.2 mmol/L 4.5   4.5   4.9    Chloride 96 - 106 mmol/L 102   103   104    CO2 20 - 29 mmol/L '21   21   26    '$ Calcium 8.7 - 10.3 mg/dL 10.0   9.9   10.0    Total Protein 6.5 - 8.1 g/dL   6.3    Total Bilirubin 0.3 - 1.2 mg/dL   0.8    Alkaline Phos 38 - 126 U/L   74    AST 15 - 41 U/L   23    ALT 0 - 44 U/L   23      Lab Results  Component Value Date   WBC 6.5 01/23/2021   HGB 16.0 (H) 01/23/2021   HCT 46.4 01/23/2021   MCV 91 01/23/2021   PLT 195 01/23/2021   NEUTROABS 4.3 12/26/2020    ASSESSMENT & PLAN:  Ductal carcinoma in situ (DCIS) of left breast 12/10/2020: Left breast biopsy: Grade 3 DCIS ER/PR positive 01/02/2021: Left lumpectomy: Grade 1 DCIS 1.5 cm with negative margins 02/12/2021-03/06/2021: Adjuvant radiation Decided to forego antiestrogen therapy  Breast cancer surveillance: 1.  Breast exam 12/24/2021: Benign 2. Mammogram 12/30/2020: Benign Next mammogram is being done at [facility] in June  Return to clinic in 6 months for follow-up and after that we can see her once a year.    No orders of the defined types were placed in this encounter.  The patient has a good understanding of the overall plan. she agrees with it. she will call with any problems that may develop before the next visit here. Total time spent: 30 mins including face to face time and time spent for planning, charting and co-ordination of care   Harriette Ohara,  MD 12/24/21    I Gardiner Coins am scribing for Dr. Lindi Adie  I have reviewed the above documentation for accuracy and completeness, and I agree with the above.

## 2021-12-18 NOTE — Telephone Encounter (Signed)
Agree with plan 

## 2021-12-24 ENCOUNTER — Encounter (INDEPENDENT_AMBULATORY_CARE_PROVIDER_SITE_OTHER): Payer: Self-pay | Admitting: Ophthalmology

## 2021-12-24 ENCOUNTER — Inpatient Hospital Stay: Payer: Medicare Other | Attending: Hematology and Oncology | Admitting: Hematology and Oncology

## 2021-12-24 ENCOUNTER — Other Ambulatory Visit: Payer: Self-pay

## 2021-12-24 ENCOUNTER — Ambulatory Visit (INDEPENDENT_AMBULATORY_CARE_PROVIDER_SITE_OTHER): Payer: Medicare Other | Admitting: Ophthalmology

## 2021-12-24 DIAGNOSIS — H43821 Vitreomacular adhesion, right eye: Secondary | ICD-10-CM | POA: Diagnosis not present

## 2021-12-24 DIAGNOSIS — H353222 Exudative age-related macular degeneration, left eye, with inactive choroidal neovascularization: Secondary | ICD-10-CM

## 2021-12-24 DIAGNOSIS — H353123 Nonexudative age-related macular degeneration, left eye, advanced atrophic without subfoveal involvement: Secondary | ICD-10-CM

## 2021-12-24 DIAGNOSIS — H43822 Vitreomacular adhesion, left eye: Secondary | ICD-10-CM | POA: Diagnosis not present

## 2021-12-24 DIAGNOSIS — D0512 Intraductal carcinoma in situ of left breast: Secondary | ICD-10-CM

## 2021-12-24 DIAGNOSIS — H353211 Exudative age-related macular degeneration, right eye, with active choroidal neovascularization: Secondary | ICD-10-CM

## 2021-12-24 DIAGNOSIS — Z853 Personal history of malignant neoplasm of breast: Secondary | ICD-10-CM | POA: Insufficient documentation

## 2021-12-24 NOTE — Assessment & Plan Note (Signed)
Minor, with traction mostly superior, no change over time

## 2021-12-24 NOTE — Progress Notes (Signed)
12/24/2021     CHIEF COMPLAINT Patient presents for  Chief Complaint  Patient presents with   Macular Degeneration      HISTORY OF PRESENT ILLNESS: Barbara Mccoy is a 88 y.o. female who presents to the clinic today for:   HPI   3 MOS for DILATE OU, AVASTIN OCT, OD. Pt stated no vision changes since last visit. Pt denies FOL but sees occasional floaters.   Last edited by Silvestre Moment on 12/24/2021  1:16 PM.      Referring physician: Burnard Bunting, MD [address] [place],  [place] [address]  HISTORICAL INFORMATION:   Selected notes from the [facility]: No current outpatient medications on file. (Ophthalmic Drugs)   No current facility-administered medications for this visit. (Ophthalmic Drugs)   Current Outpatient Medications (Other)  Medication Sig   cholecalciferol (VITAMIN D3) 25 MCG (1000 UNIT) tablet Take 1,000 Units by mouth daily.   ELIQUIS 5 MG TABS tablet TAKE 1 TABLET BY MOUTH TWICE DAILY   influenza vaccine adjuvanted (FLUAD) 0.5 ML injection Inject into the muscle.   MAGNESIUM CITRATE PO Take 250 mg by mouth daily. (Patient not taking: Reported on 09/16/2021)   Multiple Minerals-Vitamins (GNP CAL MAG ZINC +D3 PO) Take 1 tablet by mouth in the morning and at bedtime.   Multiple Vitamins-Minerals (PRESERVISION AREDS 2+MULTI VIT PO) Take 1 capsule by mouth in the morning and at bedtime.   simvastatin (ZOCOR) 20 MG tablet Take 20 mg by mouth daily.   No current facility-administered medications for this visit. (Other)      REVIEW OF SYSTEMS: ROS   Negative for: Constitutional, Gastrointestinal, Neurological, Skin, Genitourinary, Musculoskeletal, HENT, Endocrine, Cardiovascular, Eyes, Respiratory, Psychiatric, Allergic/Imm, Heme/Lymph Last edited by Silvestre Moment on 12/24/2021  1:16 PM.       ALLERGIES Allergies  Allergen Reactions   Codeine Rash    PAST MEDICAL HISTORY Past Medical History:  Diagnosis Date    Cataract    bilateral   Irritable bowel 1950   type 3C   Macular degeneration, wet ([place])    bilateral, receives injections every 8-12 weeks   Migraine headache 1950   "fewer in old age"   Squamous cell carcinoma of skin of chest 08/05/2020   Vitreomacular adhesion of right eye 11/08/2019   Past Surgical History:  Procedure Laterality Date   BREAST LUMPECTOMY WITH RADIOACTIVE SEED LOCALIZATION Left 01/02/2021   Procedure: LEFT BREAST LUMPECTOMY WITH RADIOACTIVE SEED LOCALIZATION;  Surgeon: Erroll Luna, MD;  Location: [place];  Service: General;  Laterality: Left;   CARDIOVERSION N/A 01/30/2021   Procedure: CARDIOVERSION;  Surgeon: Geralynn Rile, MD;  Location: [place];  Service: Cardiovascular;  Laterality: N/A;   CATARACT EXTRACTION, BILATERAL     INGUINAL HERNIA REPAIR     in 51's   SKIN CANCER EXCISION     TONSILLECTOMY     in childhood   TOTAL ABDOMINAL HYSTERECTOMY     w/ BSO, in 64's    FAMILY HISTORY Family History  Problem Relation Age of Onset   Brain cancer Father     SOCIAL HISTORY Social History   Tobacco Use   Smoking status: Never   Smokeless tobacco: Never  Substance Use Topics   Alcohol use: Yes    Alcohol/week: 5.0 standard drinks    Types: 4 Glasses of wine, 1 Shots of liquor per week   Drug use: Never  OPHTHALMIC EXAM:  Base Eye Exam     Visual Acuity (ETDRS)       Right Left   Dist cc 20/20 -2 20/25 -2 +2    Correction: Glasses         Tonometry (Tonopen, 1:22 PM)       Right Left   Pressure 11 12         Pupils       Pupils APD   Right PERRL None   Left PERRL None         Visual Fields       Left Right    Full Full         Extraocular Movement       Right Left    Full Full         Neuro/Psych     Oriented x3: Yes   Mood/Affect: Normal         Dilation     Both eyes: 1.0% Mydriacyl, 2.5% Phenylephrine @ 1:22 PM           Slit Lamp and Fundus Exam      External Exam       Right Left   External Normal Normal         Slit Lamp Exam       Right Left   Lids/Lashes Normal Normal   Conjunctiva/Sclera White and quiet White and quiet   Cornea Clear Clear   Anterior Chamber Deep and quiet Deep and quiet   Iris Round and reactive Round and reactive   Lens Posterior chamber intraocular lens, Open posterior capsule Posterior chamber intraocular lens   Anterior Vitreous Normal Normal         Fundus Exam       Right Left   Posterior Vitreous Posterior vitreous detachment partial    Disc Normal Normal   C/D Ratio 0.4 0.45   Macula Retinal pigment epithelial mottling, no exudates, Hard drusen, , Retinal pigment epithelial atrophy, Early age related macular degeneration, Soft drusen, no macular thickening Atrophy, Retinal pigment epithelial atrophy, Age related macular degeneration, Early age related macular degeneration, Drusen, Soft drusen, no hemorrhage   Vessels Normal Normal   Periphery Normal Normal            IMAGING AND PROCEDURES  Imaging and Procedures for 12/24/21  OCT, Retina - OU - Both Eyes       Right Eye Quality was good. Scan locations included subfoveal. Central Foveal Thickness: 245. Progression has been stable. Findings include abnormal foveal contour, vitreomacular adhesion .   Left Eye Quality was good. Scan locations included subfoveal. Central Foveal Thickness: 238. Progression has improved. Findings include abnormal foveal contour, vitreomacular adhesion .   Notes OD with persistent partial VM traction no inner foveal macular changes.  OD, 12 weeks postinjection.     OS, now at 12 months postinjection, no signs of abnormal CNVM             ASSESSMENT/PLAN:  Vitreomacular adhesion of right eye Minor, with traction mostly superior, no change over time  Vitreomacular adhesion of left eye Physiologic OS no change over time  Advanced nonexudative age-related macular degeneration of  left eye without subfoveal involvement Stable OS.  Exudative age-related macular degeneration of left eye with inactive choroidal neovascularization (HCC) No progression of disease OS.  Exudative age-related macular degeneration of right eye with active choroidal neovascularization ([place]) History of CNVM in the past, and recurrences.  Most recently her of activities.  The FAZ.  Now at 12 weeks postinjection, I informed the patient that means 6 weeks off of drug effect.  We have opted and discussed that we can observe but the follow-up with [place] occur in 4 weeks thus that would be a time.  Only 10 weeks off of therapy.  Highest risk of recurrence will be during this term.  We will continue to monitor     ICD-10-CM   1. Exudative age-related macular degeneration of right eye with active choroidal neovascularization (HCC)  H35.3211 OCT, Retina - OU - Both Eyes    CANCELED: Intravitreal Injection, Pharmacologic Agent - OD - Right Eye    2. Vitreomacular adhesion of right eye  H43.821     3. Vitreomacular adhesion of left eye  H43.822     4. Advanced nonexudative age-related macular degeneration of left eye without subfoveal involvement  H35.3123     5. Exudative age-related macular degeneration of left eye with inactive choroidal neovascularization (HCC)  H35.3222       1.  OS, stable overall no treatment for 1 year  2.  OD, now at 12 weeks, no sign of recurrence post injection 12 weeks prior.  Based upon treat and extend principles in order to treat as needed we can observe but we must for follow-up in 4 weeks dilate OD due to the high risk of recurrence in the next month.  No recurrence, will follow-up next in 3 months and observe OU 3.  Ophthalmic Meds Ordered this visit:  No orders of the defined types were placed in this encounter.      Return in about 4 weeks (around 01/21/2022) for COLOR FP, OCT, dilate, OD.  There are no Patient Instructions on file for this  visit.   Explained the diagnoses, plan, and follow up with the patient and they expressed understanding.  Patient expressed understanding of the importance of proper follow up care.   Clent Demark Fritzi Scripter M.D. Diseases & Surgery of the Retina and Vitreous [facility]ina [facility] Diabetic [place] 12/24/21     Abbreviations: M myopia (nearsighted); A astigmatism; H hyperopia (farsighted); P presbyopia; Mrx spectacle prescription;  CTL contact lenses; OD right eye; OS left eye; OU both eyes  XT exotropia; ET esotropia; PEK punctate epithelial keratitis; PEE punctate epithelial erosions; DES dry eye syndrome; MGD meibomian gland dysfunction; ATs artificial tears; PFAT's preservative free artificial tears; [place] nuclear sclerotic cataract; PSC posterior subcapsular cataract; ERM epi-retinal membrane; PVD posterior vitreous detachment; RD retinal detachment; DM diabetes mellitus; DR diabetic retinopathy; NPDR non-proliferative diabetic retinopathy; PDR proliferative diabetic retinopathy; CSME clinically significant macular edema; DME diabetic macular edema; dbh dot blot hemorrhages; CWS cotton wool spot; POAG primary open angle glaucoma; C/D cup-to-disc ratio; HVF humphrey visual field; GVF goldmann visual field; OCT optical coherence tomography; IOP intraocular pressure; BRVO Branch retinal vein occlusion; CRVO central retinal vein occlusion; CRAO central retinal artery occlusion; BRAO branch retinal artery occlusion; RT retinal tear; SB scleral buckle; PPV pars plana vitrectomy; VH Vitreous hemorrhage; PRP panretinal laser photocoagulation; IVK intravitreal kenalog; VMT vitreomacular traction; MH Macular hole;  NVD neovascularization of the disc; NVE neovascularization elsewhere; AREDS age related eye disease study; ARMD age related macular degeneration; POAG primary open angle glaucoma; EBMD epithelial/anterior basement membrane dystrophy; ACIOL anterior chamber intraocular lens; IOL intraocular lens; PCIOL posterior chamber  intraocular lens; Phaco/IOL phacoemulsification with intraocular lens placement; [place] photorefractive keratectomy; LASIK laser assisted in situ keratomileusis; HTN hypertension; DM diabetes mellitus; COPD chronic obstructive pulmonary disease

## 2021-12-24 NOTE — Assessment & Plan Note (Signed)
No progression of disease OS.

## 2021-12-24 NOTE — Assessment & Plan Note (Signed)
Stable OS 

## 2021-12-24 NOTE — Assessment & Plan Note (Signed)
History of CNVM in the past, and recurrences.  Most recently her of activities.  The FAZ.  Now at 12 weeks postinjection, I informed the patient that means 6 weeks off of drug effect.  We have opted and discussed that we can observe but the follow-up with [place] occur in 4 weeks thus that would be a time.  Only 10 weeks off of therapy.  Highest risk of recurrence will be during this term.  We will continue to monitor

## 2021-12-24 NOTE — Assessment & Plan Note (Signed)
12/10/2020: Left breast biopsy: Grade 3 DCIS ER/PR positive 01/02/2021: Left lumpectomy: Grade 1 DCIS 1.5 cm with negative margins 02/12/2021-03/06/2021: Adjuvant radiation Decided to forego antiestrogen therapy  Breast cancer surveillance: 1.  Breast exam 12/24/2021: Benign 2. Mammogram 12/30/2020: Benign  Return to clinic in 1 year for follow-up

## 2021-12-24 NOTE — Assessment & Plan Note (Signed)
Physiologic OS no change over time

## 2021-12-25 ENCOUNTER — Telehealth: Payer: Self-pay | Admitting: Hematology and Oncology

## 2021-12-25 NOTE — Telephone Encounter (Signed)
Scheduled appointment per 5/25 los. Left message.

## 2021-12-29 ENCOUNTER — Encounter (INDEPENDENT_AMBULATORY_CARE_PROVIDER_SITE_OTHER): Payer: Medicare Other | Admitting: Ophthalmology

## 2022-01-07 DIAGNOSIS — Z853 Personal history of malignant neoplasm of breast: Secondary | ICD-10-CM | POA: Diagnosis not present

## 2022-01-21 ENCOUNTER — Ambulatory Visit (INDEPENDENT_AMBULATORY_CARE_PROVIDER_SITE_OTHER): Payer: Medicare Other | Admitting: Ophthalmology

## 2022-01-21 ENCOUNTER — Encounter (INDEPENDENT_AMBULATORY_CARE_PROVIDER_SITE_OTHER): Payer: Self-pay | Admitting: Ophthalmology

## 2022-01-21 DIAGNOSIS — H353123 Nonexudative age-related macular degeneration, left eye, advanced atrophic without subfoveal involvement: Secondary | ICD-10-CM | POA: Diagnosis not present

## 2022-01-21 DIAGNOSIS — H353211 Exudative age-related macular degeneration, right eye, with active choroidal neovascularization: Secondary | ICD-10-CM | POA: Diagnosis not present

## 2022-01-21 DIAGNOSIS — H353222 Exudative age-related macular degeneration, left eye, with inactive choroidal neovascularization: Secondary | ICD-10-CM

## 2022-01-21 MED ORDER — BEVACIZUMAB 2.5 MG/0.1ML IZ SOSY
2.5000 mg | PREFILLED_SYRINGE | INTRAVITREAL | Status: AC | PRN
  Administered 2022-01-21: 2.5 mg via INTRAVITREAL

## 2022-01-21 NOTE — Assessment & Plan Note (Signed)
Slight recurrence superior to Medifast, with intraretinal fluid coincidentally at the region of vitreomacular traction yet was quiescent some 5 weeks previous

## 2022-01-21 NOTE — Progress Notes (Signed)
01/21/2022     CHIEF COMPLAINT Patient presents for  Chief Complaint  Patient presents with   Macular Degeneration      HISTORY OF PRESENT ILLNESS: Barbara Mccoy is a 88 y.o. female who presents to the clinic today for:   HPI   4 weeks color FP, OCT, Dilate OD. Patient states vision is stable and unchanged since last visit. Denies any new floaters or FOL. Patient takes PreserVision AREDS twice daily. Last edited by Nelva Nay on 01/21/2022  1:09 PM.      Referring physician: Geoffry Paradise, MD [address] [place],  [place] [address]  HISTORICAL INFORMATION:   Selected notes from the MEDICAL RECORD NUMBER       CURRENT MEDICATIONS: No current outpatient medications on file. (Ophthalmic Drugs)   No current facility-administered medications for this visit. (Ophthalmic Drugs)   Current Outpatient Medications (Other)  Medication Sig   cholecalciferol (VITAMIN D3) 25 MCG (1000 UNIT) tablet Take 1,000 Units by mouth daily.   ELIQUIS 5 MG TABS tablet TAKE 1 TABLET BY MOUTH TWICE DAILY   influenza vaccine adjuvanted (FLUAD) 0.5 ML injection Inject into the muscle.   MAGNESIUM CITRATE PO Take 250 mg by mouth daily. (Patient not taking: Reported on 09/16/2021)   Multiple Minerals-Vitamins (GNP CAL MAG ZINC +D3 PO) Take 1 tablet by mouth in the morning and at bedtime.   Multiple Vitamins-Minerals (PRESERVISION AREDS 2+MULTI VIT PO) Take 1 capsule by mouth in the morning and at bedtime.   simvastatin (ZOCOR) 20 MG tablet Take 20 mg by mouth daily.   No current facility-administered medications for this visit. (Other)      REVIEW OF SYSTEMS: ROS   Negative for: Constitutional, Gastrointestinal, Neurological, Skin, Genitourinary, Musculoskeletal, HENT, Endocrine, Cardiovascular, Eyes, Respiratory, Psychiatric, Allergic/Imm, Heme/Lymph Last edited by Edmon Crape, MD on 01/21/2022  1:25 PM.       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   Past Surgical History:  Procedure Laterality Date   BREAST LUMPECTOMY WITH RADIOACTIVE SEED LOCALIZATION Left 01/02/2021   Procedure: LEFT BREAST LUMPECTOMY WITH RADIOACTIVE SEED LOCALIZATION;  Surgeon: Harriette Bouillon, MD;  Location: [facility];  Service: General;  Laterality: Left;   CARDIOVERSION N/A 01/30/2021   Procedure: CARDIOVERSION;  Surgeon: Sande Rives, MD;  Location: [facility] ENDOSCOPY;  Service: Cardiovascular;  Laterality: N/A;   CATARACT EXTRACTION, BILATERAL     INGUINAL HERNIA REPAIR     in 40's   SKIN CANCER EXCISION     TONSILLECTOMY     in childhood   TOTAL ABDOMINAL HYSTERECTOMY     w/ BSO, in 40's    FAMILY HISTORY Family History  Problem Relation Age of Onset   Brain cancer Father     SOCIAL HISTORY Social History   Tobacco Use   Smoking status: Never   Smokeless tobacco: Never  Substance Use Topics   Alcohol use: Yes    Alcohol/week: 5.0 standard drinks of alcohol    Types: 4 Glasses of wine, 1 Shots of liquor per week  Drug use: Never         OPHTHALMIC EXAM:  Base Eye Exam     Visual Acuity (ETDRS)       Right Left   Dist cc 20/25 20/20    Correction: Glasses         Tonometry (Tonopen, 1:09 PM)       Right Left   Pressure 15 15         Pupils       Pupils Dark Light APD   Right PERRL 4 3 None   Left PERRL 4 3 None         Extraocular Movement       Right Left    Full Full         Neuro/Psych     Oriented x3: Yes   Mood/Affect: Normal         Dilation     Right eye: 1.0% Mydriacyl, 2.5% Phenylephrine @ 1:09 PM            Slit Lamp and Fundus Exam     External Exam       Right Left   External Normal Normal         Slit Lamp Exam       Right Left   Lids/Lashes Normal Normal   Conjunctiva/Sclera White and quiet White and quiet   Cornea Clear Clear   Anterior Chamber Deep and quiet Deep and quiet   Iris Round and reactive Round and reactive   Lens Posterior chamber intraocular lens, Open posterior capsule Posterior chamber intraocular lens   Anterior Vitreous Normal Normal         Fundus Exam       Right Left   Posterior Vitreous Posterior vitreous detachment partial    Disc Normal Normal   C/D Ratio 0.4 0.45   Macula Retinal pigment epithelial mottling, no exudates, Hard drusen, , Retinal pigment epithelial atrophy, Early age related macular degeneration, Soft drusen, no macular thickening Atrophy, Retinal pigment epithelial atrophy, Age related macular degeneration, Early age related macular degeneration, Drusen, Soft drusen, no hemorrhage   Vessels Normal Normal   Periphery Normal Normal            IMAGING AND PROCEDURES  Imaging and Procedures for 01/21/22  OCT, Retina - OU - Both Eyes       Right Eye Quality was good. Scan locations included subfoveal. Central Foveal Thickness: 259. Progression has been stable. Findings include abnormal foveal contour, vitreomacular adhesion .   Left Eye Quality was good. Scan locations included subfoveal. Central Foveal Thickness: 242. Progression has improved. Findings include abnormal foveal contour, vitreomacular adhesion .   Notes OD with persistent partial VM traction no inner foveal macular changes.  OD, 12 weeks postinjection.  Slight increase in intraretinal fluid superior to FAZ.  Need repeat injection OD today    OS, now at 15 months postinjection, no signs of abnormal CNVM      Color Fundus Photography Optos - OU - Both Eyes       Right Eye Progression has been stable. Disc findings include normal observations. Macula  : drusen. Vessels : normal observations. Periphery : normal observations.   Left Eye Progression has been stable. Macula : drusen. Vessels : normal observations. Periphery : normal observations.      Intravitreal Injection, Pharmacologic Agent - OD - Right Eye       Time Out 01/21/2022. 1:32 PM. Confirmed correct patient, procedure, site, and patient consented.  Anesthesia Topical anesthesia was used. Anesthetic medications included Lidocaine 4%.   Procedure Preparation included 5% betadine to ocular surface, 10% betadine to eyelids, Tobramycin 0.3%, Ofloxacin . A 30 gauge needle was used.   Injection: 2.5 mg bevacizumab 2.5 MG/0.1ML   Route: Intravitreal, Site: Right Eye   NDC: [phone], Lot: [id number], Expiration date: 03/29/2022   Post-op Post injection exam found visual acuity of at least counting fingers. The patient tolerated the procedure well. There were no complications. The patient received written and verbal post procedure care education. Post injection medications included ocuflox.              ASSESSMENT/PLAN:  Advanced nonexudative age-related macular degeneration of left eye without subfoveal involvement No sign of CNVM now  Exudative age-related macular degeneration of left eye with inactive choroidal neovascularization (HCC) 3 months off therapy no sign of CNVM recurrent  Exudative age-related macular degeneration of right eye with active choroidal neovascularization (HCC) Slight recurrence superior to Medifast, with intraretinal fluid coincidentally at the region of vitreomacular traction yet was quiescent some 5 weeks previous     ICD-10-CM   1. Exudative age-related macular degeneration of right eye with active choroidal neovascularization (HCC)  H35.3211 OCT, Retina - OU - Both Eyes    Color Fundus Photography Optos - OU - Both Eyes    Intravitreal Injection, Pharmacologic Agent - OD - Right Eye    bevacizumab (AVASTIN) SOSY 2.5 mg    2.  Advanced nonexudative age-related macular degeneration of left eye without subfoveal involvement  H35.3123     3. Exudative age-related macular degeneration of left eye with inactive choroidal neovascularization (HCC)  H35.3222       1.  Small recurrence superior to the fovea OD with preserved acuity.  Intraretinal fluid OD from CNVM.  Repeat injection today at 54-month interval post most recent injection in follow-up again in 8 weeks  2.  Dilate OU next in 8 weeks  3.  Ophthalmic Meds Ordered this visit:  Meds ordered this encounter  Medications   bevacizumab (AVASTIN) SOSY 2.5 mg       No follow-ups on file.  There are no Patient Instructions on file for this visit.   Explained the diagnoses, plan, and follow up with the patient and they expressed understanding.  Patient expressed understanding of the importance of proper follow up care.   Alford Highland Saahir Prude M.D. Diseases & Surgery of the Retina and Vitreous [facility] 01/21/22     Abbreviations: M myopia (nearsighted); A astigmatism; H hyperopia (farsighted); P presbyopia; Mrx spectacle prescription;  CTL contact lenses; OD right eye; OS left eye; OU both eyes  XT exotropia; ET esotropia; PEK punctate epithelial keratitis; PEE punctate epithelial erosions; DES dry eye syndrome; MGD meibomian gland dysfunction; ATs artificial tears; PFAT's preservative free artificial tears; NSC nuclear sclerotic cataract; PSC posterior subcapsular cataract; ERM epi-retinal membrane; PVD posterior vitreous detachment; RD retinal detachment; DM diabetes mellitus; DR diabetic retinopathy; NPDR non-proliferative diabetic retinopathy; PDR proliferative diabetic retinopathy; CSME clinically significant macular edema; DME diabetic macular edema; dbh dot blot hemorrhages; CWS cotton wool spot; POAG primary open angle glaucoma; C/D cup-to-disc ratio; HVF humphrey visual field; GVF goldmann visual field; OCT optical coherence tomography; IOP  intraocular pressure; BRVO Branch retinal vein occlusion; CRVO central retinal vein occlusion; CRAO central retinal artery occlusion; BRAO branch retinal artery occlusion; RT retinal tear; SB scleral buckle; PPV pars plana vitrectomy; VH Vitreous hemorrhage; PRP panretinal laser photocoagulation; IVK intravitreal kenalog; VMT vitreomacular traction;  MH Macular hole;  NVD neovascularization of the disc; NVE neovascularization elsewhere; AREDS age related eye disease study; ARMD age related macular degeneration; POAG primary open angle glaucoma; EBMD epithelial/anterior basement membrane dystrophy; ACIOL anterior chamber intraocular lens; IOL intraocular lens; PCIOL posterior chamber intraocular lens; Phaco/IOL phacoemulsification with intraocular lens placement; PRK photorefractive keratectomy; LASIK laser assisted in situ keratomileusis; HTN hypertension; DM diabetes mellitus; COPD chronic obstructive pulmonary disease

## 2022-01-21 NOTE — Assessment & Plan Note (Signed)
No sign of CNVM now

## 2022-01-25 DIAGNOSIS — L820 Inflamed seborrheic keratosis: Secondary | ICD-10-CM | POA: Diagnosis not present

## 2022-01-25 DIAGNOSIS — C4441 Basal cell carcinoma of skin of scalp and neck: Secondary | ICD-10-CM | POA: Diagnosis not present

## 2022-01-25 DIAGNOSIS — Z85828 Personal history of other malignant neoplasm of skin: Secondary | ICD-10-CM | POA: Diagnosis not present

## 2022-02-04 ENCOUNTER — Other Ambulatory Visit: Payer: Self-pay | Admitting: Cardiology

## 2022-02-04 NOTE — Telephone Encounter (Signed)
Prescription refill request for Eliquis received. Indication:Afib Last office visit:2/23 Scr:0.6 Age: 88 Weight:71.9 kg  Prescription refilled

## 2022-03-16 DIAGNOSIS — H52203 Unspecified astigmatism, bilateral: Secondary | ICD-10-CM | POA: Diagnosis not present

## 2022-03-16 DIAGNOSIS — Z961 Presence of intraocular lens: Secondary | ICD-10-CM | POA: Diagnosis not present

## 2022-03-18 ENCOUNTER — Ambulatory Visit (INDEPENDENT_AMBULATORY_CARE_PROVIDER_SITE_OTHER): Payer: Medicare Other | Admitting: Ophthalmology

## 2022-03-18 ENCOUNTER — Encounter (INDEPENDENT_AMBULATORY_CARE_PROVIDER_SITE_OTHER): Payer: Self-pay | Admitting: Ophthalmology

## 2022-03-18 DIAGNOSIS — H353211 Exudative age-related macular degeneration, right eye, with active choroidal neovascularization: Secondary | ICD-10-CM | POA: Diagnosis not present

## 2022-03-18 DIAGNOSIS — H353222 Exudative age-related macular degeneration, left eye, with inactive choroidal neovascularization: Secondary | ICD-10-CM | POA: Diagnosis not present

## 2022-03-18 MED ORDER — BEVACIZUMAB CHEMO INJECTION 1.25MG/0.05ML SYRINGE FOR KALEIDOSCOPE
1.2500 mg | INTRAVITREAL | Status: AC | PRN
  Administered 2022-03-18: 1.25 mg via INTRAVITREAL

## 2022-03-18 NOTE — Assessment & Plan Note (Signed)
OD small region with intraretinal fluid superior to FAZ improved today at 8-week follow-up post Avastin.  At the site of vitreal adhesion also.  Nonetheless intraretinal fluid has diminished.  We will repeat injection today send interval examination next to 10 weeks

## 2022-03-18 NOTE — Progress Notes (Signed)
03/18/2022     CHIEF COMPLAINT Patient presents for  Chief Complaint  Patient presents with   Macular Degeneration      HISTORY OF PRESENT ILLNESS: Barbara Mccoy is a 88 y.o. female who presents to the clinic today for:   HPI   8 weeks dilate ou avastin oct od post recent injection it for a small region superior to the [place] Pt states her vision has been stable Pt denies any new floaters or FOL  Last edited by Hurman Horn, MD on 03/18/2022  1:47 PM.      Referring physician: Burnard Bunting, MD [place],  [place] [address]  HISTORICAL INFORMATION:   Selected notes from the [facility]: No current outpatient medications on file. (Ophthalmic Drugs)   No current facility-administered medications for this visit. (Ophthalmic Drugs)   Current Outpatient Medications (Other)  Medication Sig   cholecalciferol (VITAMIN D3) 25 MCG (1000 UNIT) tablet Take 1,000 Units by mouth daily.   ELIQUIS 5 MG TABS tablet TAKE 1 TABLET BY MOUTH TWICE DAILY   influenza vaccine adjuvanted (FLUAD) 0.5 ML injection Inject into the muscle.   MAGNESIUM CITRATE PO Take 250 mg by mouth daily. (Patient not taking: Reported on 09/16/2021)   Multiple Minerals-Vitamins (GNP CAL MAG ZINC +D3 PO) Take 1 tablet by mouth in the morning and at bedtime.   Multiple Vitamins-Minerals (PRESERVISION AREDS 2+MULTI VIT PO) Take 1 capsule by mouth in the morning and at bedtime.   simvastatin (ZOCOR) 20 MG tablet Take 20 mg by mouth daily.   No current facility-administered medications for this visit. (Other)      REVIEW OF SYSTEMS: ROS   Negative for: Constitutional, Gastrointestinal, Neurological, Skin, Genitourinary, Musculoskeletal, HENT, Endocrine, Cardiovascular, Eyes, Respiratory, Psychiatric, Allergic/Imm, Heme/Lymph Last edited by Hurman Horn, MD on 03/18/2022  1:35 PM.       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/2019   Past Surgical History:  Procedure Laterality Date   BREAST LUMPECTOMY WITH RADIOACTIVE SEED LOCALIZATION Left 01/02/2021   Procedure: LEFT BREAST LUMPECTOMY WITH RADIOACTIVE SEED LOCALIZATION;  Surgeon: Erroll Luna, MD;  Location: [facility];  Service: General;  Laterality: Left;   CARDIOVERSION N/A 01/30/2021   Procedure: CARDIOVERSION;  Surgeon: Geralynn Rile, MD;  Location: [facility];  Service: Cardiovascular;  Laterality: N/A;   CATARACT EXTRACTION, BILATERAL     INGUINAL HERNIA REPAIR     in 25's   SKIN CANCER EXCISION     TONSILLECTOMY     in childhood   TOTAL ABDOMINAL HYSTERECTOMY     w/ BSO, in 76's    FAMILY HISTORY Family History  Problem Relation Age of Onset   Brain cancer Father     SOCIAL HISTORY Social History   Tobacco Use   Smoking status: Never   Smokeless tobacco: Never  Substance Use Topics   Alcohol use: Yes    Alcohol/week: 5.0 standard drinks of alcohol    Types: 4 Glasses of wine, 1 Shots  of liquor per week   Drug use: Never         OPHTHALMIC EXAM:  Base Eye Exam     Visual Acuity (ETDRS)       Right Left   Dist cc 20/25 20/30    Correction: Glasses         Tonometry (Tonopen, 1:09 PM)       Right Left   Pressure 14 11         Pupils       Pupils Shape   Right PERRL Round   Left PERRL          Visual Fields       Left Right    Full Full         Neuro/Psych     Oriented x3: Yes   Mood/Affect: Normal         Dilation     Both eyes: 1.0% Mydriacyl, 2.5% Phenylephrine @ 1:02 PM           Slit Lamp and Fundus Exam      External Exam       Right Left   External Normal Normal         Slit Lamp Exam       Right Left   Lids/Lashes Normal Normal   Conjunctiva/Sclera White and quiet White and quiet   Cornea Clear Clear   Anterior Chamber Deep and quiet Deep and quiet   Iris Round and reactive Round and reactive   Lens Posterior chamber intraocular lens, Open posterior capsule Posterior chamber intraocular lens   Anterior Vitreous Normal Normal         Fundus Exam       Right Left   Posterior Vitreous Posterior vitreous detachment partial    Disc Normal Normal   C/D Ratio 0.4 0.45   Macula Retinal pigment epithelial mottling, no exudates, Hard drusen, , Retinal pigment epithelial atrophy, Early age related macular degeneration, Soft drusen, no macular thickening Atrophy, Retinal pigment epithelial atrophy, Age related macular degeneration, Early age related macular degeneration, Drusen, Soft drusen, no hemorrhage   Vessels Normal Normal   Periphery Normal Normal            IMAGING AND PROCEDURES  Imaging and Procedures for 03/18/22  OCT, Retina - OU - Both Eyes       Right Eye Quality was good. Scan locations included subfoveal. Central Foveal Thickness: 237. Progression has been stable. Findings include abnormal foveal contour, vitreomacular adhesion .   Left Eye Quality was good. Scan locations included subfoveal. Central Foveal Thickness: 244. Progression has improved. Findings include abnormal foveal contour, vitreomacular adhesion .   Notes OD with persistent partial VM traction no inner foveal macular changes.  OD, 8 weeks postinjection small region of intraretinal fluid has subsided.  OS, now at 15 months postinjection, no signs of abnormal CNVM     Intravitreal Injection, Pharmacologic Agent - OD - Right Eye       Time Out 03/18/2022. 1:44 PM. Confirmed correct patient, procedure, site, and patient consented.   Anesthesia Topical anesthesia was used. Anesthetic  medications included Lidocaine 4%.   Procedure Preparation included 5% betadine to ocular surface, 10% betadine to eyelids, Tobramycin 0.3%, Ofloxacin . A 30 gauge needle was used.   Injection: 1.25 mg Bevacizumab 1.'25mg'$ /0.75m   Route: Intravitreal, Site: Right Eye   NDC: [id number]:: [id number] Expiration date: 06/16/2022   Post-op Post injection exam found visual acuity of at least counting fingers.  The patient tolerated the procedure well. There were no complications. The patient received written and verbal post procedure care education. Post injection medications included ocuflox.              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  At the site of vitreal adhesion also.  Nonetheless intraretinal fluid has diminished.  We will repeat injection today send interval examination next to 10 weeks  Exudative age-related macular degeneration of left eye with inactive choroidal neovascularization ([facility]) OS no signs of active CNVM     ICD-10-CM   1. 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 Exudative age-related macular degeneration of left eye with inactive choroidal neovascularization ([facility])  H35.3222       1.  OD much improved post injection for small amount of intraretinal fluid superior to FAZ some 8 weeks previous.  Repeat injection today OD and reevaluate next in 10 weeks  2.  3.  Ophthalmic Meds Ordered this visit:  Meds ordered this encounter  Medications   Bevacizumab (AVASTIN) SOLN 1.25 mg       Return in about 10 weeks (around 05/27/2022) for dilate, AVASTIN OCT,  OD.  There are no Patient Instructions on file for this visit.   Explained the diagnoses, plan, and follow up with the patient and they expressed understanding.  Patient expressed understanding of the importance of proper follow up care.   Clent Demark Foster Frericks M.D. Diseases & Surgery of the Retina and Vitreous [facility] 03/18/22     Abbreviations: M myopia (nearsighted); A astigmatism; H hyperopia (farsighted); P presbyopia; Mrx spectacle prescription;  CTL contact lenses; OD right eye; OS left eye; OU both eyes  XT exotropia; ET esotropia; PEK punctate epithelial keratitis; PEE punctate epithelial erosions; DES dry eye syndrome; MGD meibomian gland dysfunction; ATs artificial tears; PFAT's preservative free artificial tears; [place] nuclear sclerotic cataract; PSC posterior subcapsular cataract; ERM epi-retinal membrane; PVD posterior vitreous detachment; RD retinal detachment; DM diabetes mellitus; DR diabetic retinopathy; NPDR non-proliferative diabetic retinopathy; PDR proliferative diabetic retinopathy; CSME clinically significant macular edema; DME diabetic macular edema; dbh dot blot hemorrhages; CWS cotton wool spot; POAG primary open angle glaucoma; C/D cup-to-disc ratio; HVF humphrey visual field; GVF goldmann visual field; OCT optical coherence tomography; IOP intraocular pressure; BRVO Branch retinal vein occlusion; CRVO central retinal vein occlusion; CRAO central retinal artery occlusion; BRAO branch retinal artery occlusion; RT retinal tear; SB scleral buckle; PPV pars plana vitrectomy; VH Vitreous hemorrhage; PRP panretinal laser photocoagulation; IVK intravitreal kenalog; VMT vitreomacular traction; MH Macular hole;  NVD neovascularization of the disc; NVE neovascularization elsewhere; AREDS age related eye disease study; ARMD age related macular degeneration; POAG primary open angle glaucoma; EBMD epithelial/anterior basement membrane dystrophy; ACIOL anterior chamber  intraocular lens; IOL intraocular lens; PCIOL posterior chamber intraocular lens; Phaco/IOL phacoemulsification with intraocular lens placement; [place] photorefractive keratectomy; LASIK laser assisted in situ keratomileusis; HTN hypertension; DM diabetes mellitus; COPD chronic obstructive pulmonary disease

## 2022-03-18 NOTE — Assessment & Plan Note (Signed)
OS no signs of active CNVM

## 2022-03-22 ENCOUNTER — Encounter (INDEPENDENT_AMBULATORY_CARE_PROVIDER_SITE_OTHER): Payer: Medicare Other | Admitting: Ophthalmology

## 2022-03-22 NOTE — Progress Notes (Unsigned)
Cardiology Office Note:    Date:  03/23/2022   ID:  Barbara Mccoy, DOB [birth year], MRN [id number]  PCP:  Burnard Bunting, MD  Cardiologist:  Donato Heinz, MD  Electrophysiologist:  None   Referring MD: Burnard Bunting, MD   Chief Complaint  Patient presents with   Follow-up   Atrial Fibrillation     History of Present Illness:    Barbara Mccoy is a 88 y.o. female with a hx of breast cancer, recently diagnosed atrial fibrillation who presents for follow-up.  On 01/02/2021 she presented for lumpectomy and found to be in atrial fibrillation.  She was asymptomatic.  Underwent successful surgery.   She denies any history of A. fib but reports she saw a cardiologist years ago for palpitations and was started on atenolol at that time.  Has been off atenolol for years. Since then, and until her lumpectomy, she had no indication of Afib. Lately, she has occasional palpitations that she describes as "inconsequential" and lasts for a few seconds. She is unsure of how frequent they are, as they cause her no distress and are difficult to detect. The past few days she has felt more fatigued, and notes she has been taking 6 exercise classes a week (1 per day). During her surgery, they performed a lumpectomy only and will treat with radiation therapy in July. She has never been a smoker. Her father had a TIA in his 34's. She denies any chest pain, shortness of breath, or exertional symptoms. No headaches, lightheadedness, or syncope to report. Also has no lower extremity edema, orthopnea or PND. She denies any history of hematuria or blood in her stool, and no bleeding from her surgical site.  Echocardiogram on 01/13/2021 showed normal biventricular function, no significant valvular disease.  Underwent successful cardioversion on 01/30/2021, but was back in A-fib at follow-up appointment 03/2021.  Echocardiogram 09/14/2021 showed EF 60 to 65%, normal RV function, mild to moderate MR.  Since last clinic  visit, she reports that she has been doing well.  Home BP log shows BP 100s to 120s over 50s to 70s.  She is taking Eliquis, denies any bleeding issues.  She has lost 15 pounds since last clinic visit.  Denies any chest pain, dyspnea, lightheadedness, syncope, lower extremity edema.  Does have palpitations but not causing significant symptoms.    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    Past Surgical History:  Procedure Laterality Date   BREAST LUMPECTOMY WITH RADIOACTIVE SEED LOCALIZATION Left 01/02/2021   Procedure: LEFT BREAST LUMPECTOMY WITH RADIOACTIVE SEED LOCALIZATION;  Surgeon: Erroll Luna, MD;  Location: [facility];  Service: General;  Laterality: Left;   CARDIOVERSION N/A 01/30/2021   Procedure: CARDIOVERSION;  Surgeon: Geralynn Rile, MD;  Location: [facility];  Service: Cardiovascular;  Laterality: N/A;   CATARACT EXTRACTION, BILATERAL     INGUINAL HERNIA REPAIR     in 47's   SKIN CANCER EXCISION     TONSILLECTOMY     in childhood  TOTAL ABDOMINAL HYSTERECTOMY     w/ BSO, in 40's    Current Medications: Current Meds  Medication Sig   cholecalciferol (VITAMIN D3) 25 MCG (1000 UNIT) tablet Take 1,000 Units by mouth daily.   ELIQUIS 5 MG TABS tablet TAKE 1 TABLET BY MOUTH TWICE DAILY   influenza vaccine adjuvanted (FLUAD) 0.5 ML injection Inject into the muscle.   psyllium (METAMUCIL) 28 % packet 1 packet with 8 ounces of liquid as needed PRN   simvastatin (ZOCOR) 20 MG tablet Take 20 mg by mouth daily.     Allergies:   Codeine   Social  History   Socioeconomic History   Marital status: Widowed    Spouse name: Not on file   Number of children: Not on file   Years of education: Not on file   Highest education level: Not on file  Occupational History   Not on file  Tobacco Use   Smoking status: Never   Smokeless tobacco: Never  Substance and Sexual Activity   Alcohol use: Yes    Alcohol/week: 5.0 standard drinks of alcohol    Types: 4 Glasses of wine, 1 Shots of liquor per week   Drug use: Never   Sexual activity: Not on file  Other Topics Concern   Not on file  Social History Narrative   Not on file   Social Determinants of Health   Financial Resource Strain: Low Risk  (12/24/2020)   Overall Financial Resource Strain (CARDIA)    Difficulty of Paying Living Expenses: Not hard at all  Food Insecurity: No Food Insecurity (12/24/2020)   Hunger Vital Sign    Worried About Running Out of Food in the Last Year: Never true    Ran Out of Food in the Last Year: Never true  Transportation Needs: No Transportation Needs (12/24/2020)   PRAPARE - Hydrologist (Medical): No    Lack of Transportation (Non-Medical): No  Physical Activity: Not on file  Stress: Not on file  Social Connections: Not on file     Family History: The patient's family history includes Brain cancer in her father.  ROS:   Please see the history of present illness.     All other systems reviewed and are negative.  EKGs/Labs/Other Studies Reviewed:    The following studies were reviewed today:  Echo 01/13/2021:  1. Left ventricular ejection fraction, by estimation, is 60 to 65%. The  left ventricle has normal function. The left ventricle has no regional  wall motion abnormalities. There is mild left ventricular hypertrophy.  Left ventricular diastolic function  could not be evaluated.   2. Right ventricular systolic function is low normal. The right  ventricular size is normal. There is normal pulmonary artery  systolic  pressure. The estimated right ventricular systolic pressure is 81.0 mmHg.   3. Right atrial size was top normal.   4. The mitral valve is abnormal. Trivial mitral valve regurgitation.   5. The aortic valve is tricuspid. Aortic valve regurgitation is not  visualized.   6. The inferior vena cava is dilated in size with >50% respiratory  variability, suggesting right atrial pressure of 8 mmHg.   Comparison(s): No prior Echocardiogram.  US Carotid Duplex 04/01/2020: Right Carotid: Velocities in the right ICA are consistent with a 1-39%  stenosis.   Left Carotid: Velocities in the left ICA are consistent with a 1-39%  stenosis.   Vertebrals:  Bilateral vertebral arteries demonstrate antegrade flow.  Subclavians: Normal flow hemodynamics  were seen in bilateral subclavian arteries.   EKG:   03/23/22: Afib,rate 68, low voltage, Q waves in V1/2 09/16/21: Afib, rate 74,Q waves in V1//2 03/03/21: Afib, low voltage, rate 74, Q waves in V1/2 01/23/2021: Atrial flutter with variable conduction, rate 65, Q waves in V1/2 01/05/2021: Atrial flutter with variable conduction, rate 83, Q waves in V1/2  Recent Labs: 09/29/2021: BUN 9; Creatinine, Ser 0.69; Potassium 4.5; Sodium 138  Recent Lipid Panel No results found for: "CHOL", "TRIG", "HDL", "CHOLHDL", "VLDL", "LDLCALC", "LDLDIRECT"  Physical Exam:    VS:  BP (!) 154/78   Pulse 68   Ht '5\' 5"'$  (1.651 m)   Wt 158 lb 3.2 oz (71.8 kg)   LMP  (LMP Unknown)   BMI 26.33 kg/m     Wt Readings from Last 3 Encounters:  03/23/22 158 lb 3.2 oz (71.8 kg)  12/24/21 158 lb 9.6 oz (71.9 kg)  09/16/21 170 lb 6.4 oz (77.3 kg)     GEN: Well nourished, well developed in no acute distress HEENT: Normal NECK: No JVD; No carotid bruits LYMPHATICS: No lymphadenopathy CARDIAC: Irregular rhythm,normal rate, no murmurs, rubs, gallops RESPIRATORY:  Clear to auscultation without rales, wheezing or rhonchi  ABDOMEN: Soft, non-tender,  non-distended MUSCULOSKELETAL:  No edema; No deformity  SKIN: Warm and dry NEUROLOGIC:  Alert and oriented x 3 PSYCHIATRIC:  Normal affect   ASSESSMENT:    1. Permanent atrial fibrillation ([place])   2. Hyperlipidemia, unspecified hyperlipidemia type   3. Essential hypertension   4. Bilateral carotid artery stenosis      PLAN:    Atrial fibrillation/flutter: New diagnosis 12/2020.  She is asymptomatic.  CHA2DS2-VASc score 3 (age x2, female).  Echocardiogram on 01/13/2021 showed normal biventricular function, no significant valvular disease.  Underwent successful cardioversion on 01/30/2021 but back in Afib at f/u appointment 03/2021.  Echocardiogram 09/14/2021 showed EF 60 to 65%, normal RV function, mild to moderate MR. -Given she is rate controlled and asymptomatic and failed prior cardioversion, recommend rate control strategy.  -Zio patch x3 days showed low rates (average rate 64) and pauses up to 3 seconds.  She felt poorly since starting metoprolol, suspect this is due to bradycardia.  Metoprolol discontinued.  Rates appear well controlled off of metoprolol -Continue Eliquis 5 mg twice daily.    Hyperlipidemia: On simvastatin 20 mg daily.  LDL 64 on 06/18/21  Hypertension: BP elevated at prior clinic appointment and home log, was started on losartan 25 mg daily.  Did not tolerate losartan, was discontinued.  BP elevated in clinic but well controlled on home log.  Continue to monitor.  Carotid stenosis: mild bilateral carotid stenosis on duplex 04/01/20.  Discussed repeating carotid duplex for monitoring, she wishes to hold off at this time.  Continue Eliquis, statin  RTC in 6 months  Medication Adjustments/Labs and Tests Ordered: Current medicines are reviewed at length with the patient today.  Concerns regarding medicines are outlined above.  No orders of the defined types were placed in this encounter.    No orders of the defined types were placed in this encounter.    Patient  Instructions  Medication Instructions:  Continue same medications *If you need a refill on your cardiac medications before your next appointment, please call your pharmacy*   Lab Work: None ordered   Testing/Procedures: None ordered   Follow-Up: At [facility], you and your health needs are our priority.  As part of our continuing mission to provide you with exceptional heart care, we  have created designated Provider Care Teams.  These Care Teams include your primary Cardiologist (physician) and Advanced Practice Providers (APPs -  Physician Assistants and Nurse Practitioners) who all work together to provide you with the care you need, when you need it.  We recommend signing up for the patient portal called "MyChart".  Sign up information is provided on this After Visit Summary.  MyChart is used to connect with patients for Virtual Visits (Telemedicine).  Patients are able to view lab/test results, encounter notes, upcoming appointments, etc.  Non-urgent messages can be sent to your provider as well.   To learn more about what you can do with MyChart, go to [email or web address].    Your next appointment:  6 months    Call in Oct to schedule Feb appointment     The format for your next appointment: Office    Provider:  Dr.Timm Bonenberger   Important Information About Sugar          Signed, Donato Heinz, MD  03/23/2022 11:07 AM    [place]

## 2022-03-23 ENCOUNTER — Encounter: Payer: Self-pay | Admitting: Cardiology

## 2022-03-23 ENCOUNTER — Ambulatory Visit (INDEPENDENT_AMBULATORY_CARE_PROVIDER_SITE_OTHER): Payer: Medicare Other | Admitting: Cardiology

## 2022-03-23 VITALS — BP 154/78 | HR 68 | Ht 65.0 in | Wt 158.2 lb

## 2022-03-23 DIAGNOSIS — E785 Hyperlipidemia, unspecified: Secondary | ICD-10-CM

## 2022-03-23 DIAGNOSIS — I10 Essential (primary) hypertension: Secondary | ICD-10-CM

## 2022-03-23 DIAGNOSIS — I4821 Permanent atrial fibrillation: Secondary | ICD-10-CM | POA: Diagnosis not present

## 2022-03-23 DIAGNOSIS — I6523 Occlusion and stenosis of bilateral carotid arteries: Secondary | ICD-10-CM | POA: Diagnosis not present

## 2022-03-23 NOTE — Patient Instructions (Signed)
Medication Instructions:  Continue same medications *If you need a refill on your cardiac medications before your next appointment, please call your pharmacy*   Lab Work: None ordered   Testing/Procedures: None ordered   Follow-Up: At [facility], you and your health needs are our priority.  As part of our continuing mission to provide you with exceptional heart care, we have created designated Provider Care Teams.  These Care Teams include your primary Cardiologist (physician) and Advanced Practice Providers (APPs -  Physician Assistants and Nurse Practitioners) who all work together to provide you with the care you need, when you need it.  We recommend signing up for the patient portal called "MyChart".  Sign up information is provided on this After Visit Summary.  MyChart is used to connect with patients for Virtual Visits (Telemedicine).  Patients are able to view lab/test results, encounter notes, upcoming appointments, etc.  Non-urgent messages can be sent to your provider as well.   To learn more about what you can do with MyChart, go to [email or web address].    Your next appointment:  6 months    Call in Oct to schedule Feb appointment     The format for your next appointment: Office    Provider:  Dr.Schumann   Important Information About Sugar

## 2022-05-03 ENCOUNTER — Other Ambulatory Visit (HOSPITAL_BASED_OUTPATIENT_CLINIC_OR_DEPARTMENT_OTHER): Payer: Self-pay

## 2022-05-03 DIAGNOSIS — Z23 Encounter for immunization: Secondary | ICD-10-CM | POA: Diagnosis not present

## 2022-05-03 MED ORDER — FLUAD QUADRIVALENT 0.5 ML IM PRSY
PREFILLED_SYRINGE | INTRAMUSCULAR | 0 refills | Status: AC
  Filled 2022-05-03: qty 0.5, 1d supply, fill #0

## 2022-05-27 ENCOUNTER — Encounter (INDEPENDENT_AMBULATORY_CARE_PROVIDER_SITE_OTHER): Payer: Medicare Other | Admitting: Ophthalmology

## 2022-05-27 DIAGNOSIS — H353133 Nonexudative age-related macular degeneration, bilateral, advanced atrophic without subfoveal involvement: Secondary | ICD-10-CM | POA: Diagnosis not present

## 2022-05-27 DIAGNOSIS — G43B0 Ophthalmoplegic migraine, not intractable: Secondary | ICD-10-CM | POA: Diagnosis not present

## 2022-05-27 DIAGNOSIS — H353211 Exudative age-related macular degeneration, right eye, with active choroidal neovascularization: Secondary | ICD-10-CM | POA: Diagnosis not present

## 2022-05-27 DIAGNOSIS — H353222 Exudative age-related macular degeneration, left eye, with inactive choroidal neovascularization: Secondary | ICD-10-CM | POA: Diagnosis not present

## 2022-05-27 DIAGNOSIS — H43821 Vitreomacular adhesion, right eye: Secondary | ICD-10-CM | POA: Diagnosis not present

## 2022-06-02 DIAGNOSIS — Z23 Encounter for immunization: Secondary | ICD-10-CM | POA: Diagnosis not present

## 2022-06-25 NOTE — Progress Notes (Signed)
Patient Care Team: Burnard Bunting, MD as PCP - General (Internal Medicine) Donato Heinz, MD as PCP - Cardiology (Cardiology) Erroll Luna, MD as Consulting Physician (General Surgery) Eppie Gibson, MD as Attending Physician (Radiation Oncology) Rolm Bookbinder, MD as Consulting Physician (Dermatology) Zadie Rhine Clent Demark, MD as Consulting Physician (Ophthalmology) Nicholas Lose, MD as Consulting Physician (Hematology and Oncology)  DIAGNOSIS:  Encounter Diagnosis  Name Primary?   Ductal carcinoma in situ (DCIS) of left breast Yes    SUMMARY OF ONCOLOGIC HISTORY: Oncology History  Ductal carcinoma in situ (DCIS) of left breast  12/19/2020 Initial Diagnosis   Ductal carcinoma in situ (DCIS) of left breast   01/02/2021 Definitive Surgery   FINAL MICROSCOPIC DIAGNOSIS:   A. BREAST, LEFT, LUMPECTOMY:  - Low grade ductal carcinoma in situ, spanning approximately 1.5 cm.  - Lobular neoplasia (atypical lobular hyperplasia).  - Biopsy site.  - Final margins (parts B-D) are negative.  - See oncology table.   B. BREAST, LEFT ADDITIONAL ANTEROSUPERIOR MARGIN, EXCISION:  - Benign breast tissue.   C. BREAST, LEFT ADDITIONAL POSTEROMEDIAL MARGIN, EXCISION:  - Benign breast tissue.   D. BREAST, LEFT ADDITIONAL LATEROINFERIOR MARGIN, EXCISION:  - Benign breast tissue.   ONCOLOGY TABLE:   DCIS OF THE BREAST:  Resection   Procedure: Left breast lumpectomy with additional margins  Specimen Laterality: Left  Histologic Type: Ductal carcinoma in situ  Size of DCIS: Approximately 1.5 cm  Nuclear Grade: Low-grade, see comment  Necrosis: Not identified  Margins: All final margins negative for DCIS       Specify Closest Margin (required only if <2m): >10 mm  Regional Lymph Nodes:  No lymph nodes sampled or submitted  Breast Biomarker Testing Performed on Previous Biopsy:       Testing performed on Case Number: [id number]       Estrogen Receptor: Positive, greater than 95%,  strong staining       Progesterone Receptor: Positive, 20%, strong staining  Pathologic Stage Classification (pTNM, AJCC 8th Edition): pTis, pN not  assigned  Representative Tumor Block: A1  Comment(s): Cytokeratin 5/6 is negative in areas of DCIS. Compared to  the biopsy, the current DCIS is low grade. Residual high grade DCIS is  not seen. E-cadherin is negative in [facility] of [facility]    02/12/2021 - 03/06/2021 Radiation Therapy   Plan ID Fractions First Treatment Last Treatment  Dose  Breast_Lt_BH 16 / 16 02/12/2021 03/06/2021 42.56Gy/ 42.56Gy        CHIEF COMPLIANT: Follow-up surveillance DCIS left breast  INTERVAL HISTORY: Barbara ALASis a 88y.o. with a history of left breast DCIS. She presents to the clinic for a follow-up. She states that she is doing well. She denies any pain in breast.    ALLERGIES:  is allergic to codeine.  MEDICATIONS:  Current Outpatient Medications  Medication Sig Dispense Refill   cholecalciferol (VITAMIN D3) 25 MCG (1000 UNIT) tablet Take 1,000 Units by mouth daily.     Cholecalciferol 25 MCG (1000 UT) CHEW 25 tablets.     ELIQUIS 5 MG TABS tablet TAKE 1 TABLET BY MOUTH TWICE DAILY 180 tablet 1   influenza vaccine adjuvanted (FLUAD QUADRIVALENT) 0.5 ML injection Inject into the muscle. 0.5 mL 0   influenza vaccine adjuvanted (FLUAD) 0.5 ML injection Inject into the muscle. 0.5 mL 0   psyllium (METAMUCIL) 28 % packet 1 packet with 8 ounces of liquid as needed PRN     simvastatin (ZOCOR) 20 MG tablet Take 20 mg  by mouth daily.     No current facility-administered medications for this visit.    PHYSICAL EXAMINATION: ECOG PERFORMANCE STATUS: 1 - Symptomatic but completely ambulatory  Vitals:   06/29/22 1508  BP: (!) 140/80  Pulse: 88  Resp: 18  Temp: 97.6 F (36.4 C)  SpO2: 99%   Filed Weights   06/29/22 1508  Weight: 161 lb 1.6 oz (73.1 kg)    BREAST: No palpable masses or nodules in either right or left breasts. No palpable axillary  supraclavicular or infraclavicular adenopathy no breast tenderness or nipple discharge. (exam performed in the presence of a chaperone)  LABORATORY DATA:  I have reviewed the data as listed    Latest Ref Rng & Units 09/29/2021    2:23 PM 01/23/2021   11:03 AM 12/26/2020    3:59 PM  CMP  Glucose 70 - 99 mg/dL 87  95  101   BUN 8 - 27 mg/dL '9  12  10   '$ Creatinine 0.57 - 1.00 mg/dL 0.69  0.69  0.68   Sodium 134 - 144 mmol/L 138  139  135   Potassium 3.5 - 5.2 mmol/L 4.5  4.5  4.9   Chloride 96 - 106 mmol/L 102  103  104   CO2 20 - 29 mmol/L '21  21  26   '$ Calcium 8.7 - 10.3 mg/dL 10.0  9.9  10.0   Total Protein 6.5 - 8.1 g/dL   6.3   Total Bilirubin 0.3 - 1.2 mg/dL   0.8   Alkaline Phos 38 - 126 U/L   74   AST 15 - 41 U/L   23   ALT 0 - 44 U/L   23     Lab Results  Component Value Date   WBC 6.5 01/23/2021   HGB 16.0 (H) 01/23/2021   HCT 46.4 01/23/2021   MCV 91 01/23/2021   PLT 195 01/23/2021   NEUTROABS 4.3 12/26/2020    ASSESSMENT & PLAN:  Ductal carcinoma in situ (DCIS) of left breast 12/10/2020: Left breast biopsy: Grade 3 DCIS ER/PR positive 01/02/2021: Left lumpectomy: Grade 1 DCIS 1.5 cm with negative margins 02/12/2021-03/06/2021: Adjuvant radiation Decided to forego antiestrogen therapy   Breast cancer surveillance: 1.  Breast exam 06/29/2022: Benign 2. Mammogram 12/30/2021: Benign  Return to clinic in 1 year for follow-up    No orders of the defined types were placed in this encounter.  The patient has a good understanding of the overall plan. she agrees with it. she will call with any problems that may develop before the next visit here. Total time spent: 30 mins including face to face time and time spent for planning, charting and co-ordination of care   Harriette Ohara, MD 06/29/22    I Gardiner Coins am scribing for Dr. Lindi Adie  I have reviewed the above documentation for accuracy and completeness, and I agree with the above.

## 2022-06-28 ENCOUNTER — Ambulatory Visit: Payer: Medicare Other | Admitting: Hematology and Oncology

## 2022-06-29 ENCOUNTER — Other Ambulatory Visit: Payer: Self-pay

## 2022-06-29 ENCOUNTER — Inpatient Hospital Stay: Payer: Medicare Other | Attending: Hematology and Oncology | Admitting: Hematology and Oncology

## 2022-06-29 VITALS — BP 140/80 | HR 88 | Temp 97.6°F | Resp 18 | Ht 65.0 in | Wt 161.1 lb

## 2022-06-29 DIAGNOSIS — D0512 Intraductal carcinoma in situ of left breast: Secondary | ICD-10-CM | POA: Diagnosis not present

## 2022-06-29 DIAGNOSIS — E785 Hyperlipidemia, unspecified: Secondary | ICD-10-CM | POA: Diagnosis not present

## 2022-06-29 DIAGNOSIS — Z923 Personal history of irradiation: Secondary | ICD-10-CM | POA: Insufficient documentation

## 2022-06-29 DIAGNOSIS — R7301 Impaired fasting glucose: Secondary | ICD-10-CM | POA: Diagnosis not present

## 2022-06-29 NOTE — Assessment & Plan Note (Signed)
12/10/2020: Left breast biopsy: Grade 3 DCIS ER/PR positive 01/02/2021: Left lumpectomy: Grade 1 DCIS 1.5 cm with negative margins 02/12/2021-03/06/2021: Adjuvant radiation Decided to forego antiestrogen therapy   Breast cancer surveillance: 1.  Breast exam 06/29/2022: Benign 2. Mammogram 12/30/2021: Benign  Return to clinic in 1 year for follow-up

## 2022-06-30 ENCOUNTER — Telehealth: Payer: Self-pay | Admitting: Hematology and Oncology

## 2022-06-30 NOTE — Telephone Encounter (Signed)
Scheduled appointments per 11/28 los. Patient is aware.

## 2022-07-19 DIAGNOSIS — R7301 Impaired fasting glucose: Secondary | ICD-10-CM | POA: Diagnosis not present

## 2022-07-19 DIAGNOSIS — Z1331 Encounter for screening for depression: Secondary | ICD-10-CM | POA: Diagnosis not present

## 2022-07-19 DIAGNOSIS — Z1339 Encounter for screening examination for other mental health and behavioral disorders: Secondary | ICD-10-CM | POA: Diagnosis not present

## 2022-07-19 DIAGNOSIS — E663 Overweight: Secondary | ICD-10-CM | POA: Diagnosis not present

## 2022-07-19 DIAGNOSIS — E785 Hyperlipidemia, unspecified: Secondary | ICD-10-CM | POA: Diagnosis not present

## 2022-07-19 DIAGNOSIS — Z0000 Encounter for general adult medical examination without abnormal findings: Secondary | ICD-10-CM | POA: Diagnosis not present

## 2022-07-19 DIAGNOSIS — M8580 Other specified disorders of bone density and structure, unspecified site: Secondary | ICD-10-CM | POA: Diagnosis not present

## 2022-07-28 ENCOUNTER — Other Ambulatory Visit: Payer: Self-pay | Admitting: Cardiology

## 2022-07-28 DIAGNOSIS — I4821 Permanent atrial fibrillation: Secondary | ICD-10-CM

## 2022-07-28 NOTE — Telephone Encounter (Signed)
Prescription refill request for Eliquis received. Indication: Afib  Last office visit: 03/23/22 Gardiner Rhyme)  Scr: 0.69 (09/29/21)  Age: 88 Weight: 73.1kg  Appropriate dose and refill sent to requested pharmacy.

## 2022-08-23 DIAGNOSIS — H43822 Vitreomacular adhesion, left eye: Secondary | ICD-10-CM | POA: Diagnosis not present

## 2022-08-23 DIAGNOSIS — H43821 Vitreomacular adhesion, right eye: Secondary | ICD-10-CM | POA: Diagnosis not present

## 2022-08-23 DIAGNOSIS — H353133 Nonexudative age-related macular degeneration, bilateral, advanced atrophic without subfoveal involvement: Secondary | ICD-10-CM | POA: Diagnosis not present

## 2022-08-23 DIAGNOSIS — H353222 Exudative age-related macular degeneration, left eye, with inactive choroidal neovascularization: Secondary | ICD-10-CM | POA: Diagnosis not present

## 2022-08-23 DIAGNOSIS — H353211 Exudative age-related macular degeneration, right eye, with active choroidal neovascularization: Secondary | ICD-10-CM | POA: Diagnosis not present

## 2022-09-23 ENCOUNTER — Encounter: Payer: Self-pay | Admitting: Cardiology

## 2022-09-23 ENCOUNTER — Ambulatory Visit: Payer: Medicare Other | Attending: Cardiology | Admitting: Cardiology

## 2022-09-23 VITALS — BP 148/72 | HR 83 | Ht 65.0 in | Wt 164.2 lb

## 2022-09-23 DIAGNOSIS — E785 Hyperlipidemia, unspecified: Secondary | ICD-10-CM | POA: Diagnosis not present

## 2022-09-23 DIAGNOSIS — I4821 Permanent atrial fibrillation: Secondary | ICD-10-CM | POA: Diagnosis not present

## 2022-09-23 DIAGNOSIS — I10 Essential (primary) hypertension: Secondary | ICD-10-CM | POA: Diagnosis not present

## 2022-09-23 DIAGNOSIS — I6523 Occlusion and stenosis of bilateral carotid arteries: Secondary | ICD-10-CM | POA: Diagnosis not present

## 2022-09-23 NOTE — Patient Instructions (Signed)
Medication Instructions:  Your physician recommends that you continue on your current medications as directed. Please refer to the Current Medication list given to you today.  *If you need a refill on your cardiac medications before your next appointment, please call your pharmacy*  Testing/Procedures: Your physician has requested that you have a carotid duplex. This test is an ultrasound of the carotid arteries in your neck. It looks at blood flow through these arteries that supply the brain with blood. Allow one hour for this exam. There are no restrictions or special instructions.  Follow-Up: At [facility], you and your health needs are our priority.  As part of our continuing mission to provide you with exceptional heart care, we have created designated Provider Care Teams.  These Care Teams include your primary Cardiologist (physician) and Advanced Practice Providers (APPs -  Physician Assistants and Nurse Practitioners) who all work together to provide you with the care you need, when you need it.  We recommend signing up for the patient portal called "MyChart".  Sign up information is provided on this After Visit Summary.  MyChart is used to connect with patients for Virtual Visits (Telemedicine).  Patients are able to view lab/test results, encounter notes, upcoming appointments, etc.  Non-urgent messages can be sent to your provider as well.   To learn more about what you can do with MyChart, go to [email or web address].    Your next appointment:   6 month(s)  Provider:   Donato Heinz, MD

## 2022-09-23 NOTE — Progress Notes (Signed)
Cardiology Office Note:    Date:  09/23/2022   ID:  SHANTAI SHOBE, DOB 1933/07/19, MRN [id number]  PCP:  Burnard Bunting, MD  Cardiologist:  Donato Heinz, MD  Electrophysiologist:  None   Referring MD: Burnard Bunting, MD   Chief Complaint  Patient presents with   Atrial Fibrillation     History of Present Illness:    Barbara Mccoy is a 89 y.o. female with a hx of breast cancer, atrial fibrillation who presents for follow-up.  On 01/02/2021 she presented for lumpectomy and found to be in atrial fibrillation.  She was asymptomatic.  Underwent successful surgery.   She denies any history of A. fib but reports she saw a cardiologist years ago for palpitations and was started on atenolol at that time.  Has been off atenolol for years. Since then, and until her lumpectomy, she had no indication of Afib. Lately, she has occasional palpitations that she describes as "inconsequential" and lasts for a few seconds. She is unsure of how frequent they are, as they cause her no distress and are difficult to detect. The past few days she has felt more fatigued, and notes she has been taking 6 exercise classes a week (1 per day). During her surgery, they performed a lumpectomy only and will treat with radiation therapy in July. She has never been a smoker. Her father had a TIA in his 46's. She denies any chest pain, shortness of breath, or exertional symptoms. No headaches, lightheadedness, or syncope to report. Also has no lower extremity edema, orthopnea or PND. She denies any history of hematuria or blood in her stool, and no bleeding from her surgical site.  Echocardiogram on 01/13/2021 showed normal biventricular function, no significant valvular disease.  Underwent successful cardioversion on 01/30/2021, but was back in A-fib at follow-up appointment 03/2021.  Echocardiogram 09/14/2021 showed EF 60 to 65%, normal RV function, mild to moderate MR.  Since last clinic visit, she reports he is doing  okay.  Reports has had some issues with balance recently.  She denies any lightheadedness or syncope but reports feels unstable sometimes when walking.  Thinks it is related to skipping meals.  Denies any chest pain, dyspnea, or lower extremity edema.  Continues to have intermittent palpitations.  Denies any bleeding on Eliquis.  Continues to exercise regularly.    BP Readings from Last 3 Encounters:  09/23/22 (!) 148/72  06/29/22 (!) 140/80  03/23/22 (!) 154/78    Wt Readings from Last 3 Encounters:  09/23/22 164 lb 3.2 oz (74.5 kg)  06/29/22 161 lb 1.6 oz (73.1 kg)  03/23/22 158 lb 3.2 oz (71.8 kg)      Past Medical History:  Diagnosis Date   Cataract    bilateral   Irritable bowel 1950   type 3C   Macular degeneration, wet ([place])    bilateral, receives injections every 8-12 weeks   Migraine headache 1950   "fewer in old age"   Squamous cell carcinoma of skin of chest 08/05/2020   Vitreomacular adhesion of right eye 11/08/2019    Past Surgical History:  Procedure Laterality Date   BREAST LUMPECTOMY WITH RADIOACTIVE SEED LOCALIZATION Left 01/02/2021   Procedure: LEFT BREAST LUMPECTOMY WITH RADIOACTIVE SEED LOCALIZATION;  Surgeon: Erroll Luna, MD;  Location: [facility];  Service: General;  Laterality: Left;   CARDIOVERSION N/A 01/30/2021   Procedure: CARDIOVERSION;  Surgeon: Geralynn Rile, MD;  Location: [facility];  Service: Cardiovascular;  Laterality: N/A;   CATARACT  EXTRACTION, BILATERAL     INGUINAL HERNIA REPAIR     in 40's   SKIN CANCER EXCISION     TONSILLECTOMY     in childhood   TOTAL ABDOMINAL HYSTERECTOMY     w/ BSO, in 40's    Current Medications: Current Meds  Medication Sig   cholecalciferol (VITAMIN D3) 25 MCG (1000 UNIT) tablet Take 1,000 Units by mouth daily.   Cholecalciferol 25 MCG (1000 UT) CHEW 25 tablets.   ELIQUIS 5 MG TABS tablet TAKE 1 TABLET BY MOUTH TWICE DAILY   influenza vaccine adjuvanted (FLUAD QUADRIVALENT)  0.5 ML injection Inject into the muscle.   influenza vaccine adjuvanted (FLUAD) 0.5 ML injection Inject into the muscle.   psyllium (METAMUCIL) 28 % packet 1 packet with 8 ounces of liquid as needed PRN   simvastatin (ZOCOR) 20 MG tablet Take 20 mg by mouth daily.     Allergies:   Codeine   Social History   Socioeconomic History   Marital status: Widowed    Spouse name: Not on file   Number of children: Not on file   Years of education: Not on file   Highest education level: Not on file  Occupational History   Not on file  Tobacco Use   Smoking status: Never   Smokeless tobacco: Never  Substance and Sexual Activity   Alcohol use: Yes    Alcohol/week: 5.0 standard drinks of alcohol    Types: 4 Glasses of wine, 1 Shots of liquor per week   Drug use: Never   Sexual activity: Not on file  Other Topics Concern   Not on file  Social History Narrative   Not on file   Social Determinants of Health   Financial Resource Strain: Low Risk  (12/24/2020)   Overall Financial Resource Strain (CARDIA)    Difficulty of Paying Living Expenses: Not hard at all  Food Insecurity: No Food Insecurity (12/24/2020)   Hunger Vital Sign    Worried About Running Out of Food in the Last Year: Never true    Ran Out of Food in the Last Year: Never true  Transportation Needs: No Transportation Needs (12/24/2020)   PRAPARE - Hydrologist (Medical): No    Lack of Transportation (Non-Medical): No  Physical Activity: Not on file  Stress: Not on file  Social Connections: Not on file     Family History: The patient's family history includes Brain cancer in her father.  ROS:   Please see the history of present illness.     All other systems reviewed and are negative.  EKGs/Labs/Other Studies Reviewed:    The following studies were reviewed today:  Echo 01/13/2021:  1. Left ventricular ejection fraction, by estimation, is 60 to 65%. The  left ventricle has normal  function. The left ventricle has no regional  wall motion abnormalities. There is mild left ventricular hypertrophy.  Left ventricular diastolic function  could not be evaluated.   2. Right ventricular systolic function is low normal. The right  ventricular size is normal. There is normal pulmonary artery systolic  pressure. The estimated right ventricular systolic pressure is [id number] mmHg.   3. Right atrial size was top normal.   4. The mitral valve is abnormal. Trivial mitral valve regurgitation.   5. The aortic valve is tricuspid. Aortic valve regurgitation is not  visualized.   6. The inferior vena cava is dilated in size with >50% respiratory  variability, suggesting right atrial pressure of 8  mmHg.   Comparison(s): No prior Echocardiogram.  US Carotid Duplex 04/01/2020: Right Carotid: Velocities in the right ICA are consistent with a 1-39%  stenosis.   Left Carotid: Velocities in the left ICA are consistent with a 1-39%  stenosis.   Vertebrals:  Bilateral vertebral arteries demonstrate antegrade flow.  Subclavians: Normal flow hemodynamics were seen in bilateral subclavian arteries.   EKG:   09/23/22: Atrial fibrillation, heart rate 83, Q waves in V1/2 03/23/22: Afib,rate 68, low voltage, Q waves in V1/2 09/16/21: Afib, rate 74,Q waves in V1//2 03/03/21: Afib, low voltage, rate 74, Q waves in V1/2 01/23/2021: Atrial flutter with variable conduction, rate 65, Q waves in V1/2 01/05/2021: Atrial flutter with variable conduction, rate 83, Q waves in V1/2  Recent Labs: 09/29/2021: BUN 9; Creatinine, Ser 0.69; Potassium 4.5; Sodium 138  Recent Lipid Panel No results found for: "CHOL", "TRIG", "HDL", "CHOLHDL", "VLDL", "LDLCALC", "LDLDIRECT"  Physical Exam:    VS:  BP (!) 148/72   Pulse 83   Ht 5' 5"$  (1.651 m)   Wt 164 lb 3.2 oz (74.5 kg)   LMP  (LMP Unknown)   SpO2 99%   BMI 27.32 kg/m     Wt Readings from Last 3 Encounters:  09/23/22 164 lb 3.2 oz (74.5 kg)  06/29/22 161 lb  1.6 oz (73.1 kg)  03/23/22 158 lb 3.2 oz (71.8 kg)     GEN: Well nourished, well developed in no acute distress HEENT: Normal NECK: No JVD; No carotid bruits LYMPHATICS: No lymphadenopathy CARDIAC: Irregular rhythm,normal rate, no murmurs, rubs, gallops RESPIRATORY:  Clear to auscultation without rales, wheezing or rhonchi  ABDOMEN: Soft, non-tender, non-distended MUSCULOSKELETAL:  No edema; No deformity  SKIN: Warm and dry NEUROLOGIC:  Alert and oriented x 3 PSYCHIATRIC:  Normal affect   ASSESSMENT:    1. Permanent atrial fibrillation ([place])   2. Bilateral carotid artery stenosis   3. Essential hypertension   4. Hyperlipidemia, unspecified hyperlipidemia type       PLAN:    Atrial fibrillation/flutter: New diagnosis 12/2020.  She is asymptomatic.  CHA2DS2-VASc score 3 (age x2, female).  Echocardiogram on 01/13/2021 showed normal biventricular function, no significant valvular disease.  Underwent successful cardioversion on 01/30/2021 but back in Afib at f/u appointment 03/2021.  Echocardiogram 09/14/2021 showed EF 60 to 65%, normal RV function, mild to moderate MR. -Given she is rate controlled and asymptomatic and failed prior cardioversion, recommend rate control strategy.  -Zio patch x3 days showed low rates (average rate 64) and pauses up to 3 seconds.  She felt poorly since starting metoprolol, suspect this is due to bradycardia.  Metoprolol discontinued.  Rates appear well controlled off of metoprolol -Continue Eliquis 5 mg twice daily.    Hyperlipidemia: On simvastatin 20 mg daily.  LDL 66 on 06/29/2022  Hypertension: BP elevated at prior clinic appointment and home log, was started on losartan 25 mg daily.  Did not tolerate losartan, was discontinued.  BP mildly elevated in clinic, will continue to monitor  Carotid stenosis: mild bilateral carotid stenosis on duplex 04/01/20.  Will repeat  duplex for monitoring.  Continue Eliquis, statin   RTC in 6 months  Medication  Adjustments/Labs and Tests Ordered: Current medicines are reviewed at length with the patient today.  Concerns regarding medicines are outlined above.  Orders Placed This Encounter  Procedures   EKG 12-Lead   VAS US CAROTID     No orders of the defined types were placed in this encounter.    Patient  Instructions  Medication Instructions:  Your physician recommends that you continue on your current medications as directed. Please refer to the Current Medication list given to you today.  *If you need a refill on your cardiac medications before your next appointment, please call your pharmacy*  Testing/Procedures: Your physician has requested that you have a carotid duplex. This test is an ultrasound of the carotid arteries in your neck. It looks at blood flow through these arteries that supply the brain with blood. Allow one hour for this exam. There are no restrictions or special instructions.  Follow-Up: At [facility], you and your health needs are our priority.  As part of our continuing mission to provide you with exceptional heart care, we have created designated Provider Care Teams.  These Care Teams include your primary Cardiologist (physician) and Advanced Practice Providers (APPs -  Physician Assistants and Nurse Practitioners) who all work together to provide you with the care you need, when you need it.  We recommend signing up for the patient portal called "MyChart".  Sign up information is provided on this After Visit Summary.  MyChart is used to connect with patients for Virtual Visits (Telemedicine).  Patients are able to view lab/test results, encounter notes, upcoming appointments, etc.  Non-urgent messages can be sent to your provider as well.   To learn more about what you can do with MyChart, go to [email or web address].    Your next appointment:   6 month(s)  Provider:   Donato Heinz, MD         Signed, Donato Heinz, MD   09/23/2022 1:54 PM    [facility]

## 2022-09-30 ENCOUNTER — Ambulatory Visit (HOSPITAL_COMMUNITY)
Admission: RE | Admit: 2022-09-30 | Discharge: 2022-09-30 | Disposition: A | Discharge status: Home / Self Care | Payer: Medicare Other | Source: Ambulatory Visit | Attending: Cardiology | Admitting: Cardiology

## 2022-09-30 DIAGNOSIS — I6523 Occlusion and stenosis of bilateral carotid arteries: Secondary | ICD-10-CM | POA: Insufficient documentation

## 2022-10-18 DIAGNOSIS — G43B0 Ophthalmoplegic migraine, not intractable: Secondary | ICD-10-CM | POA: Diagnosis not present

## 2022-10-18 DIAGNOSIS — H353222 Exudative age-related macular degeneration, left eye, with inactive choroidal neovascularization: Secondary | ICD-10-CM | POA: Diagnosis not present

## 2022-10-18 DIAGNOSIS — H353133 Nonexudative age-related macular degeneration, bilateral, advanced atrophic without subfoveal involvement: Secondary | ICD-10-CM | POA: Diagnosis not present

## 2022-10-18 DIAGNOSIS — H353211 Exudative age-related macular degeneration, right eye, with active choroidal neovascularization: Secondary | ICD-10-CM | POA: Diagnosis not present

## 2022-10-18 DIAGNOSIS — H43823 Vitreomacular adhesion, bilateral: Secondary | ICD-10-CM | POA: Diagnosis not present

## 2022-11-25 DIAGNOSIS — Z23 Encounter for immunization: Secondary | ICD-10-CM | POA: Diagnosis not present

## 2022-12-20 DIAGNOSIS — H353211 Exudative age-related macular degeneration, right eye, with active choroidal neovascularization: Secondary | ICD-10-CM | POA: Diagnosis not present

## 2022-12-20 DIAGNOSIS — H353222 Exudative age-related macular degeneration, left eye, with inactive choroidal neovascularization: Secondary | ICD-10-CM | POA: Diagnosis not present

## 2022-12-20 DIAGNOSIS — H353133 Nonexudative age-related macular degeneration, bilateral, advanced atrophic without subfoveal involvement: Secondary | ICD-10-CM | POA: Diagnosis not present

## 2022-12-20 DIAGNOSIS — H43821 Vitreomacular adhesion, right eye: Secondary | ICD-10-CM | POA: Diagnosis not present

## 2022-12-20 DIAGNOSIS — H43822 Vitreomacular adhesion, left eye: Secondary | ICD-10-CM | POA: Diagnosis not present

## 2023-01-18 DIAGNOSIS — I872 Venous insufficiency (chronic) (peripheral): Secondary | ICD-10-CM | POA: Diagnosis not present

## 2023-01-18 DIAGNOSIS — I8311 Varicose veins of right lower extremity with inflammation: Secondary | ICD-10-CM | POA: Diagnosis not present

## 2023-01-18 DIAGNOSIS — I8312 Varicose veins of left lower extremity with inflammation: Secondary | ICD-10-CM | POA: Diagnosis not present

## 2023-01-18 DIAGNOSIS — D2262 Melanocytic nevi of left upper limb, including shoulder: Secondary | ICD-10-CM | POA: Diagnosis not present

## 2023-01-18 DIAGNOSIS — L820 Inflamed seborrheic keratosis: Secondary | ICD-10-CM | POA: Diagnosis not present

## 2023-01-18 DIAGNOSIS — L821 Other seborrheic keratosis: Secondary | ICD-10-CM | POA: Diagnosis not present

## 2023-01-18 DIAGNOSIS — Z85828 Personal history of other malignant neoplasm of skin: Secondary | ICD-10-CM | POA: Diagnosis not present

## 2023-01-18 DIAGNOSIS — B078 Other viral warts: Secondary | ICD-10-CM | POA: Diagnosis not present

## 2023-01-18 DIAGNOSIS — D2261 Melanocytic nevi of right upper limb, including shoulder: Secondary | ICD-10-CM | POA: Diagnosis not present

## 2023-01-18 DIAGNOSIS — D225 Melanocytic nevi of trunk: Secondary | ICD-10-CM | POA: Diagnosis not present

## 2023-01-18 DIAGNOSIS — D485 Neoplasm of uncertain behavior of skin: Secondary | ICD-10-CM | POA: Diagnosis not present

## 2023-01-18 DIAGNOSIS — L570 Actinic keratosis: Secondary | ICD-10-CM | POA: Diagnosis not present

## 2023-01-18 DIAGNOSIS — C44219 Basal cell carcinoma of skin of left ear and external auricular canal: Secondary | ICD-10-CM | POA: Diagnosis not present

## 2023-01-19 DIAGNOSIS — Z853 Personal history of malignant neoplasm of breast: Secondary | ICD-10-CM | POA: Diagnosis not present

## 2023-02-05 ENCOUNTER — Other Ambulatory Visit: Payer: Self-pay | Admitting: Cardiology

## 2023-02-05 DIAGNOSIS — I4821 Permanent atrial fibrillation: Secondary | ICD-10-CM

## 2023-02-07 NOTE — Telephone Encounter (Signed)
Prescription refill request for Eliquis received. Indication:afib Last office visit:2/24 ZOX:[id number] labs at next appt Age:89  Weight:74.5  kg  Prescription refilled

## 2023-03-01 ENCOUNTER — Other Ambulatory Visit: Payer: Self-pay | Admitting: Cardiology

## 2023-03-01 DIAGNOSIS — I4821 Permanent atrial fibrillation: Secondary | ICD-10-CM

## 2023-03-01 NOTE — Telephone Encounter (Signed)
Prescription refill request for Eliquis received. Indication: AF Last office visit: 09/23/22  Barbara Camel MD Scr: 0.69 on 09/29/21 (Labs have been requested at upcoming appt with MD 9/24) Age: 89 Weight: 74.5kg  Based on above findings Eliquis 5mg  twice daily is the appropriate dose.  Refill approved.

## 2023-03-21 DIAGNOSIS — H353222 Exudative age-related macular degeneration, left eye, with inactive choroidal neovascularization: Secondary | ICD-10-CM | POA: Diagnosis not present

## 2023-03-21 DIAGNOSIS — H43823 Vitreomacular adhesion, bilateral: Secondary | ICD-10-CM | POA: Diagnosis not present

## 2023-03-21 DIAGNOSIS — H353211 Exudative age-related macular degeneration, right eye, with active choroidal neovascularization: Secondary | ICD-10-CM | POA: Diagnosis not present

## 2023-03-21 DIAGNOSIS — H353133 Nonexudative age-related macular degeneration, bilateral, advanced atrophic without subfoveal involvement: Secondary | ICD-10-CM | POA: Diagnosis not present

## 2023-04-04 NOTE — Progress Notes (Deleted)
Cardiology Office Note:    Date:  04/04/2023   ID:  NICTE SORBER, DOB 03/09/1933, MRN [id number]  PCP:  Geoffry Paradise, MD  Cardiologist:  Little Ishikawa, MD  Electrophysiologist:  None   Referring MD: Geoffry Paradise, MD   No chief complaint on file.    History of Present Illness:    Barbara Mccoy is a 89 y.o. female with a hx of breast cancer, atrial fibrillation who presents for follow-up.  On 01/02/2021 she presented for lumpectomy and found to be in atrial fibrillation.  She was asymptomatic.  Underwent successful surgery.   She denies any history of A. fib but reports she saw a cardiologist years ago for palpitations and was started on atenolol at that time.  Has been off atenolol for years. Since then, and until her lumpectomy, she had no indication of Afib. Lately, she has occasional palpitations that she describes as "inconsequential" and lasts for a few seconds. She is unsure of how frequent they are, as they cause her no distress and are difficult to detect. The past few days she has felt more fatigued, and notes she has been taking 6 exercise classes a week (1 per day). During her surgery, they performed a lumpectomy only and will treat with radiation therapy in July. She has never been a smoker. Her father had a TIA in his 60's. She denies any chest pain, shortness of breath, or exertional symptoms. No headaches, lightheadedness, or syncope to report. Also has no lower extremity edema, orthopnea or PND. She denies any history of hematuria or blood in her stool, and no bleeding from her surgical site.  Echocardiogram on 01/13/2021 showed normal biventricular function, no significant valvular disease.  Underwent successful cardioversion on 01/30/2021, but was back in A-fib at follow-up appointment 03/2021.  Echocardiogram 09/14/2021 showed EF 60 to 65%, normal RV function, mild to moderate MR.  Since last clinic visit, she reports he is doing okay.  Reports has had some issues  with balance recently.  She denies any lightheadedness or syncope but reports feels unstable sometimes when walking.  Thinks it is related to skipping meals.  Denies any chest pain, dyspnea, or lower extremity edema.  Continues to have intermittent palpitations.  Denies any bleeding on Eliquis.  Continues to exercise regularly.    BP Readings from Last 3 Encounters:  09/23/22 (!) 148/72  06/29/22 (!) 140/80  03/23/22 (!) 154/78    Wt Readings from Last 3 Encounters:  09/23/22 164 lb 3.2 oz (74.5 kg)  06/29/22 161 lb 1.6 oz (73.1 kg)  03/23/22 158 lb 3.2 oz (71.8 kg)      Past Medical History:  Diagnosis Date   Cataract    bilateral   Irritable bowel 1950   type 3C   Macular degeneration, wet ([facility])    bilateral, receives injections every 8-12 weeks   Migraine headache 1950   "fewer in old age"   Squamous cell carcinoma of skin of chest 08/05/2020   Vitreomacular adhesion of right eye 11/08/2019    Past Surgical History:  Procedure Laterality Date   BREAST LUMPECTOMY WITH RADIOACTIVE SEED LOCALIZATION Left 01/02/2021   Procedure: LEFT BREAST LUMPECTOMY WITH RADIOACTIVE SEED LOCALIZATION;  Surgeon: Harriette Bouillon, MD;  Location: [facility];  Service: General;  Laterality: Left;   CARDIOVERSION N/A 01/30/2021   Procedure: CARDIOVERSION;  Surgeon: Sande Rives, MD;  Location: [facility];  Service: Cardiovascular;  Laterality: N/A;   CATARACT EXTRACTION, BILATERAL  INGUINAL HERNIA REPAIR     in 40's   SKIN CANCER EXCISION     TONSILLECTOMY     in childhood   TOTAL ABDOMINAL HYSTERECTOMY     w/ BSO, in 40's    Current Medications: No outpatient medications have been marked as taking for the 04/07/23 encounter (Appointment) with Little Ishikawa, MD.     Allergies:   Codeine   Social History   Socioeconomic History   Marital status: Widowed    Spouse name: Not on file   Number of children: Not on file   Years of education: Not on file    Highest education level: Not on file  Occupational History   Not on file  Tobacco Use   Smoking status: Never   Smokeless tobacco: Never  Substance and Sexual Activity   Alcohol use: Yes    Alcohol/week: 5.0 standard drinks of alcohol    Types: 4 Glasses of wine, 1 Shots of liquor per week   Drug use: Never   Sexual activity: Not on file  Other Topics Concern   Not on file  Social History Narrative   Not on file   Social Determinants of Health   Financial Resource Strain: Low Risk  (12/24/2020)   Overall Financial Resource Strain (CARDIA)    Difficulty of Paying Living Expenses: Not hard at all  Food Insecurity: No Food Insecurity (12/24/2020)   Hunger Vital Sign    Worried About Running Out of Food in the Last Year: Never true    Ran Out of Food in the Last Year: Never true  Transportation Needs: No Transportation Needs (12/24/2020)   PRAPARE - Administrator, Civil Service (Medical): No    Lack of Transportation (Non-Medical): No  Physical Activity: Not on file  Stress: Not on file  Social Connections: Not on file     Family History: The patient's family history includes Brain cancer in her father.  ROS:   Please see the history of present illness.     All other systems reviewed and are negative.  EKGs/Labs/Other Studies Reviewed:    The following studies were reviewed today:  Echo 01/13/2021:  1. Left ventricular ejection fraction, by estimation, is 60 to 65%. The  left ventricle has normal function. The left ventricle has no regional  wall motion abnormalities. There is mild left ventricular hypertrophy.  Left ventricular diastolic function  could not be evaluated.   2. Right ventricular systolic function is low normal. The right  ventricular size is normal. There is normal pulmonary artery systolic  pressure. The estimated right ventricular systolic pressure is 32.2 mmHg.   3. Right atrial size was top normal.   4. The mitral valve is abnormal.  Trivial mitral valve regurgitation.   5. The aortic valve is tricuspid. Aortic valve regurgitation is not  visualized.   6. The inferior vena cava is dilated in size with >50% respiratory  variability, suggesting right atrial pressure of 8 mmHg.   Comparison(s): No prior Echocardiogram.  US Carotid Duplex 04/01/2020: Right Carotid: Velocities in the right ICA are consistent with a 1-39%  stenosis.   Left Carotid: Velocities in the left ICA are consistent with a 1-39%  stenosis.   Vertebrals:  Bilateral vertebral arteries demonstrate antegrade flow.  Subclavians: Normal flow hemodynamics were seen in bilateral subclavian arteries.   EKG:   09/23/22: Atrial fibrillation, heart rate 83, Q waves in V1/2 03/23/22: Afib,rate 68, low voltage, Q waves in V1/2 09/16/21: Afib, rate  74,Q waves in V1//2 03/03/21: Afib, low voltage, rate 74, Q waves in V1/2 01/23/2021: Atrial flutter with variable conduction, rate 65, Q waves in V1/2 01/05/2021: Atrial flutter with variable conduction, rate 83, Q waves in V1/2  Recent Labs: No results found for requested labs within last 365 days.  Recent Lipid Panel No results found for: "CHOL", "TRIG", "HDL", "CHOLHDL", "VLDL", "LDLCALC", "LDLDIRECT"  Physical Exam:    VS:  LMP  (LMP Unknown)     Wt Readings from Last 3 Encounters:  09/23/22 164 lb 3.2 oz (74.5 kg)  06/29/22 161 lb 1.6 oz (73.1 kg)  03/23/22 158 lb 3.2 oz (71.8 kg)     GEN: Well nourished, well developed in no acute distress HEENT: Normal NECK: No JVD; No carotid bruits LYMPHATICS: No lymphadenopathy CARDIAC: Irregular rhythm,normal rate, no murmurs, rubs, gallops RESPIRATORY:  Clear to auscultation without rales, wheezing or rhonchi  ABDOMEN: Soft, non-tender, non-distended MUSCULOSKELETAL:  No edema; No deformity  SKIN: Warm and dry NEUROLOGIC:  Alert and oriented x 3 PSYCHIATRIC:  Normal affect   ASSESSMENT:    No diagnosis found.     PLAN:    Atrial fibrillation/flutter:  New diagnosis 12/2020.  She is asymptomatic.  CHA2DS2-VASc score 3 (age x2, female).  Echocardiogram on 01/13/2021 showed normal biventricular function, no significant valvular disease.  Underwent successful cardioversion on 01/30/2021 but back in Afib at f/u appointment 03/2021.  Echocardiogram 09/14/2021 showed EF 60 to 65%, normal RV function, mild to moderate MR. -Given she is rate controlled and asymptomatic and failed prior cardioversion, recommend rate control strategy.  -Zio patch x3 days showed low rates (average rate 64) and pauses up to 3 seconds.  She felt poorly since starting metoprolol, suspect this is due to bradycardia.  Metoprolol discontinued.  Rates appear well controlled off of metoprolol -Continue Eliquis 5 mg twice daily.    Hyperlipidemia: On simvastatin 20 mg daily.  LDL 66 on 06/29/2022  Hypertension: BP elevated at prior clinic appointment and home log, was started on losartan 25 mg daily.  Did not tolerate losartan, was discontinued.  BP mildly elevated in clinic, will continue to monitor  Carotid stenosis: mild bilateral carotid stenosis on duplex 04/01/20.  Stable mild stenosis on duplex 10/2022.  Continue Eliquis, statin   RTC in 6 months***  Medication Adjustments/Labs and Tests Ordered: Current medicines are reviewed at length with the patient today.  Concerns regarding medicines are outlined above.  No orders of the defined types were placed in this encounter.    No orders of the defined types were placed in this encounter.    There are no Patient Instructions on file for this visit.    Signed, Little Ishikawa, MD  04/04/2023 9:14 PM    [facility]

## 2023-04-07 ENCOUNTER — Ambulatory Visit: Payer: Medicare Other | Admitting: Cardiology

## 2023-04-07 NOTE — Progress Notes (Addendum)
Cardiology Clinic Note   Date: 04/08/2023 ID: JUDAH DEVOLL, DOB 14-Jul-1933, MRN [id number]  Primary Cardiologist:  Little Ishikawa, MD  Patient Profile    Barbara Mccoy is a 89 y.o. female who presents to the clinic today for routine follow up.     Past medical history significant for: Permanent A-fib/a-flutter. Onset June 2022 in the setting of lumpectomy. 3-day ZIO 01/20/2021: 100% A-fib burden.  2 pauses, longest 3.1 seconds. DCCV 01/30/2021. Echo 09/14/2021: EF 60 to 65%.  No RWMA.  Mild LVH.  Normal RV function.  Mild to moderate MR. Hyperlipidemia. Lipid panel 06/29/2022: LDL 66, HDL 65, TG 64, total 144. Breast cancer. Carotid artery stenosis. Carotid duplex 09/30/2022: Bilateral ICA 1 to 39%.     History of Present Illness    Barbara Mccoy was first evaluated by Dr. Bjorn Pippin on 01/05/2021 for A-fib at the request of Dr. Jacky Kindle.  Patient underwent lumpectomy on 01/02/2021 and was noted to be in A-fib.  Anesthesia contacted Dr. Jens Som who recommended starting patient on Toprol 25 mg daily and Eliquis 5 mg twice daily once hemostasis was achieved.  Patient with no cardiac awareness of arrhythmia.  Prior to surgery patient was participating in exercise classes 6 days a week and had noted more fatigue over the few days prior.  The time of her visit she was in a-flutter, rate 83 bpm.  3-day ZIO showed 100% A-fib burden.  He underwent DCCV 01/30/2021.  On follow-up on 03/03/2021 she was back in A-fib.  She continues to be followed by Dr. Bjorn Pippin for the above outlined history.  Patient was last seen in the office by Dr. Bjorn Pippin on 09/23/2022 for routine follow-up.  She was doing well at that time with complaints of recent balance issues when walking that she related to skipping meals.  No lightheadedness, near-syncope or syncope.  Patient had felt poorly on metoprolol so it was discontinued.  Rate was well-controlled.  Today, patient is here alone. She is doing well. She resides at  [facility]. Patient denies shortness of breath or dyspnea on exertion. No chest pain, pressure, or tightness. Denies lower extremity edema, orthopnea, or PND. No palpitations. She typically does not have cardiac awareness of arrhythmia but will occasionally feel fluttering when turning over in bed. She participates in [employer] 3 days a week. She reports 2 episodes of bleeding external hemorrhoid that resolved with holding pressure. No other bleeding concerns. BP is elevated today. She reports home SBP never >134.    ROS: All other systems reviewed and are otherwise negative except as noted in History of Present Illness.  Studies Reviewed    EKG Interpretation Date/Time:  Friday April 08 2023 15:40:30 EDT Ventricular Rate:  80 PR Interval:    QRS Duration:  76 QT Interval:  372 QTC Calculation: 429 R Axis:   -6  Text Interpretation: Atrial fibrillation Septal infarct (cited on or before 02-Jan-2021) When compared with ECG of 09/23/2022 no significant changes Confirmed by Carlos Levering [id number]) on 04/08/2023 4:00:04 PM    Risk Assessment/Calculations     CHA2DS2-VASc Score = 3   This indicates a 3.2% annual risk of stroke. The patient's score is based upon: CHF History: 0 HTN History: 0 Diabetes History: 0 Stroke History: 0 Vascular Disease History: 0 Age Score: 2 Gender Score: 1    HYPERTENSION CONTROL Vitals:   04/08/23 1544 04/08/23 1634  BP: (!) 150/80 (!) 142/82    The patient's blood pressure is elevated above target today.  In order to address the patient's elevated BP: The blood pressure is usually elevated in clinic.  Blood pressures monitored at home have been optimal.           Physical Exam    VS:  BP (!) 142/82 (BP Location: Left Arm, Patient Position: Sitting, Cuff Size: Normal)   Pulse 84   Ht 5\' 5"  (1.651 m)   Wt 165 lb (74.8 kg)   LMP  (LMP Unknown)   SpO2 95%   BMI 27.46 kg/m  , BMI Body mass index is 27.46 kg/m.  GEN: Well nourished,  well developed, in no acute distress. Neck: No JVD or carotid bruits. Cardiac:  Irregular rhythm, controlled rate. No murmurs. No rubs or gallops.   Respiratory:  Respirations regular and unlabored. Clear to auscultation without rales, wheezing or rhonchi. GI: Soft, nontender, nondistended. Extremities: Radials/DP/PT 2+ and equal bilaterally. No clubbing or cyanosis. No edema.  Skin: Warm and dry, no rash. Neuro: Strength intact.  Assessment & Plan    Permanent A-fib.  Onset June 2022 in the setting of a lumpectomy.  3-day ZIO June 2022 showed 100% A-fib burden.  DCCV July 2022.  Back in A-fib August 2022 and opted for rate control.  Echo February 2023 showed normal LV/RV function with mild to moderate MR.  Patient does not typically have cardiac awareness of afib. She will occasionally feel flutters when she turns over in bed. She reports 2 episodes of bleeding external hemorrhoid that resolved with holding pressure. No other spontaneous bleeding concerns. EKG shows afib, 80 bpm.  Continue Eliquis. Appropriate Eliquis dose.  Patient not on beta-blocker secondary to probable bradycardia. Hyperlipidemia.  LDL November 2023 66, at goal.  Continue simvastatin.  Followed by PCP. Carotid artery stenosis.  Carotid duplex February 2024 showed bilateral ICA 1 to 39%.  Patient denies lightheadedness, dizziness, presyncope, syncope.  Continue simvastatin.  Disposition: Return in 6 months or sooner as needed.          Signed, Etta Grandchild. Stefen Juba, DNP, NP-C

## 2023-04-08 ENCOUNTER — Encounter: Payer: Self-pay | Admitting: Student

## 2023-04-08 ENCOUNTER — Ambulatory Visit: Payer: Medicare Other | Attending: Cardiology | Admitting: Student

## 2023-04-08 VITALS — BP 142/82 | HR 84 | Ht 65.0 in | Wt 165.0 lb

## 2023-04-08 DIAGNOSIS — E782 Mixed hyperlipidemia: Secondary | ICD-10-CM | POA: Diagnosis not present

## 2023-04-08 DIAGNOSIS — I4821 Permanent atrial fibrillation: Secondary | ICD-10-CM | POA: Diagnosis not present

## 2023-04-08 DIAGNOSIS — I6523 Occlusion and stenosis of bilateral carotid arteries: Secondary | ICD-10-CM | POA: Diagnosis not present

## 2023-04-08 NOTE — Patient Instructions (Signed)
Medication Instructions:  No medication changes *If you need a refill on your cardiac medications before your next appointment, please call your pharmacy*   Lab Work: No lab orders If you have labs (blood work) drawn today and your tests are completely normal, you will receive your results only by: MyChart Message (if you have MyChart) OR A paper copy in the mail If you have any lab test that is abnormal or we need to change your treatment, we will call you to review the results.   Testing/Procedures: none   Follow-Up: At [facility], you and your health needs are our priority.  As part of our continuing mission to provide you with exceptional heart care, we have created designated Provider Care Teams.  These Care Teams include your primary Cardiologist (physician) and Advanced Practice Providers (APPs -  Physician Assistants and Nurse Practitioners) who all work together to provide you with the care you need, when you need it.  We recommend signing up for the patient portal called "MyChart".  Sign up information is provided on this After Visit Summary.  MyChart is used to connect with patients for Virtual Visits (Telemedicine).  Patients are able to view lab/test results, encounter notes, upcoming appointments, etc.  Non-urgent messages can be sent to your provider as well.   To learn more about what you can do with MyChart, go to [email or web address].    Your next appointment:   6 month(s)  Provider:   Little Ishikawa, MD

## 2023-04-26 DIAGNOSIS — Z961 Presence of intraocular lens: Secondary | ICD-10-CM | POA: Diagnosis not present

## 2023-04-26 DIAGNOSIS — H52203 Unspecified astigmatism, bilateral: Secondary | ICD-10-CM | POA: Diagnosis not present

## 2023-05-04 ENCOUNTER — Other Ambulatory Visit: Payer: Self-pay | Admitting: Cardiology

## 2023-05-04 DIAGNOSIS — I4821 Permanent atrial fibrillation: Secondary | ICD-10-CM

## 2023-05-04 NOTE — Telephone Encounter (Signed)
Prescription refill request for Eliquis received. Indication: AF Last office visit: 04/08/23  D Whittenborn NP Scr: 0.69 on 09/09/21 Age: 89 Weight: 74.8kg  Based on above findings Eliquis 5mg  twice daily is the appropriate dose.  Refill approved.

## 2023-05-05 DIAGNOSIS — Z23 Encounter for immunization: Secondary | ICD-10-CM | POA: Diagnosis not present

## 2023-06-20 DIAGNOSIS — H43821 Vitreomacular adhesion, right eye: Secondary | ICD-10-CM | POA: Diagnosis not present

## 2023-06-20 DIAGNOSIS — G43B0 Ophthalmoplegic migraine, not intractable: Secondary | ICD-10-CM | POA: Diagnosis not present

## 2023-06-20 DIAGNOSIS — H353222 Exudative age-related macular degeneration, left eye, with inactive choroidal neovascularization: Secondary | ICD-10-CM | POA: Diagnosis not present

## 2023-06-20 DIAGNOSIS — H353133 Nonexudative age-related macular degeneration, bilateral, advanced atrophic without subfoveal involvement: Secondary | ICD-10-CM | POA: Diagnosis not present

## 2023-06-20 DIAGNOSIS — H353211 Exudative age-related macular degeneration, right eye, with active choroidal neovascularization: Secondary | ICD-10-CM | POA: Diagnosis not present

## 2023-06-20 DIAGNOSIS — H43822 Vitreomacular adhesion, left eye: Secondary | ICD-10-CM | POA: Diagnosis not present

## 2023-06-29 ENCOUNTER — Other Ambulatory Visit: Payer: Self-pay | Admitting: Cardiology

## 2023-06-29 DIAGNOSIS — I4821 Permanent atrial fibrillation: Secondary | ICD-10-CM

## 2023-06-29 NOTE — Telephone Encounter (Signed)
Prescription refill request for Eliquis received. Indication: AF Last office visit: 04/08/23  D Whittenborn NP Scr: 0.69 on 09/29/21  [facility] Age: 89 Weight: 74.8kg  Based on above findings Eliquis 5mg  twice daily is the appropriate dose.  Refill approved.  Pt is due for lab work.  Requested labs be done at upcoming appt with Dr Bjorn Pippin.

## 2023-07-04 ENCOUNTER — Inpatient Hospital Stay: Payer: Medicare Other | Attending: Hematology and Oncology | Admitting: Hematology and Oncology

## 2023-07-04 ENCOUNTER — Inpatient Hospital Stay: Payer: Medicare Other | Admitting: Hematology and Oncology

## 2023-07-04 DIAGNOSIS — D0512 Intraductal carcinoma in situ of left breast: Secondary | ICD-10-CM | POA: Diagnosis not present

## 2023-07-04 NOTE — Assessment & Plan Note (Addendum)
12/10/2020: Left breast biopsy: Grade 3 DCIS ER/PR positive 01/02/2021: Left lumpectomy: Grade 1 DCIS 1.5 cm with negative margins 02/12/2021-03/06/2021: Adjuvant radiation Decided to forego antiestrogen therapy   Breast cancer surveillance: 1.  Breast exam 07/04/2023: Benign 2. Mammogram [facility] 12/2022: Benign   Return to clinic on an needed basis

## 2023-07-04 NOTE — Progress Notes (Signed)
HEMATOLOGY-ONCOLOGY TELEPHONE VISIT PROGRESS NOTE  I connected with our patient on 07/04/23 at  2:00 PM EST by telephone and verified that I am speaking with the correct person using two identifiers.  I discussed the limitations, risks, security and privacy concerns of performing an evaluation and management service by telephone and the availability of in person appointments.  I also discussed with the patient that there may be a patient responsible charge related to this service. The patient expressed understanding and agreed to proceed.   History of Present Illness: Telephone follow-up to discuss breast cancer surveillance  History of Present Illness   The patient, with a history of breast cancer, presents for a routine follow-up. She reports adherence to annual mammograms, with the next one scheduled for June of the coming year. She has had no concerning findings on previous mammograms. The patient expresses satisfaction with her current health status and the treatments she received at the breast center. She is committed to maintaining regular mammogram screenings.        Oncology History  Ductal carcinoma in situ (DCIS) of left breast  12/19/2020 Initial Diagnosis   Ductal carcinoma in situ (DCIS) of left breast   01/02/2021 Definitive Surgery   FINAL MICROSCOPIC DIAGNOSIS:   A. BREAST, LEFT, LUMPECTOMY:  - Low grade ductal carcinoma in situ, spanning approximately 1.5 cm.  - Lobular neoplasia (atypical lobular hyperplasia).  - Biopsy site.  - Final margins (parts B-D) are negative.  - See oncology table.   B. BREAST, LEFT ADDITIONAL ANTEROSUPERIOR MARGIN, EXCISION:  - Benign breast tissue.   C. BREAST, LEFT ADDITIONAL POSTEROMEDIAL MARGIN, EXCISION:  - Benign breast tissue.   D. BREAST, LEFT ADDITIONAL LATEROINFERIOR MARGIN, EXCISION:  - Benign breast tissue.   ONCOLOGY TABLE:   DCIS OF THE BREAST:  Resection   Procedure: Left breast lumpectomy with additional margins   Specimen Laterality: Left  Histologic Type: Ductal carcinoma in situ  Size of DCIS: Approximately 1.5 cm  Nuclear Grade: Low-grade, see comment  Necrosis: Not identified  Margins: All final margins negative for DCIS       Specify Closest Margin (required only if <61mm): >10 mm  Regional Lymph Nodes:  No lymph nodes sampled or submitted  Breast Biomarker Testing Performed on Previous Biopsy:       Testing performed on Case Number: [id number]       Estrogen Receptor: Positive, greater than 95%, strong staining       Progesterone Receptor: Positive, 20%, strong staining  Pathologic Stage Classification (pTNM, AJCC 8th Edition): pTis, pN not  assigned  Representative Tumor Block: A1  Comment(s): Cytokeratin 5/6 is negative in areas of DCIS. Compared to  the biopsy, the current DCIS is low grade. Residual high grade DCIS is  not seen. E-cadherin is negative in areas of [facility].    02/12/2021 - 03/06/2021 Radiation Therapy   Plan ID Fractions First Treatment Last Treatment  Dose  Breast_Lt_BH 16 / 16 02/12/2021 03/06/2021 42.56Gy/ 42.56Gy        REVIEW OF SYSTEMS:   Constitutional: Denies fevers, chills or abnormal weight loss All other systems were reviewed with the patient and are negative. Observations/Objective:     Assessment Plan:  Ductal carcinoma in situ (DCIS) of left breast 12/10/2020: Left breast biopsy: Grade 3 DCIS ER/PR positive 01/02/2021: Left lumpectomy: Grade 1 DCIS 1.5 cm with negative margins 02/12/2021-03/06/2021: Adjuvant radiation Decided to forego antiestrogen therapy   Breast cancer surveillance: 1.  Breast exam 07/04/2023: Benign 2. Mammogram [facility] 12/2022: Benign  Return to clinic on an needed basis        I discussed the assessment and treatment plan with the patient. The patient was provided an opportunity to ask questions and all were answered. The patient agreed with the plan and demonstrated an understanding of the instructions. The patient was advised to  call back or seek an in-person evaluation if the symptoms worsen or if the condition fails to improve as anticipated.   I provided 12 minutes of non-face-to-face time during this encounter.  This includes time for charting and coordination of care   Tamsen Meek, MD

## 2023-08-01 DIAGNOSIS — Z0189 Encounter for other specified special examinations: Secondary | ICD-10-CM | POA: Diagnosis not present

## 2023-08-01 DIAGNOSIS — E785 Hyperlipidemia, unspecified: Secondary | ICD-10-CM | POA: Diagnosis not present

## 2023-08-01 DIAGNOSIS — M8580 Other specified disorders of bone density and structure, unspecified site: Secondary | ICD-10-CM | POA: Diagnosis not present

## 2023-08-01 DIAGNOSIS — K581 Irritable bowel syndrome with constipation: Secondary | ICD-10-CM | POA: Diagnosis not present

## 2023-08-01 DIAGNOSIS — K219 Gastro-esophageal reflux disease without esophagitis: Secondary | ICD-10-CM | POA: Diagnosis not present

## 2023-08-01 DIAGNOSIS — R7301 Impaired fasting glucose: Secondary | ICD-10-CM | POA: Diagnosis not present

## 2023-08-08 DIAGNOSIS — E663 Overweight: Secondary | ICD-10-CM | POA: Diagnosis not present

## 2023-08-08 DIAGNOSIS — Z1339 Encounter for screening examination for other mental health and behavioral disorders: Secondary | ICD-10-CM | POA: Diagnosis not present

## 2023-08-08 DIAGNOSIS — C50919 Malignant neoplasm of unspecified site of unspecified female breast: Secondary | ICD-10-CM | POA: Diagnosis not present

## 2023-08-08 DIAGNOSIS — R82998 Other abnormal findings in urine: Secondary | ICD-10-CM | POA: Diagnosis not present

## 2023-08-08 DIAGNOSIS — Z23 Encounter for immunization: Secondary | ICD-10-CM | POA: Diagnosis not present

## 2023-08-08 DIAGNOSIS — R7301 Impaired fasting glucose: Secondary | ICD-10-CM | POA: Diagnosis not present

## 2023-08-08 DIAGNOSIS — K219 Gastro-esophageal reflux disease without esophagitis: Secondary | ICD-10-CM | POA: Diagnosis not present

## 2023-08-08 DIAGNOSIS — E785 Hyperlipidemia, unspecified: Secondary | ICD-10-CM | POA: Diagnosis not present

## 2023-08-08 DIAGNOSIS — Z1331 Encounter for screening for depression: Secondary | ICD-10-CM | POA: Diagnosis not present

## 2023-08-08 DIAGNOSIS — Z0000 Encounter for general adult medical examination without abnormal findings: Secondary | ICD-10-CM | POA: Diagnosis not present

## 2023-09-19 DIAGNOSIS — H353133 Nonexudative age-related macular degeneration, bilateral, advanced atrophic without subfoveal involvement: Secondary | ICD-10-CM | POA: Diagnosis not present

## 2023-09-19 DIAGNOSIS — H353222 Exudative age-related macular degeneration, left eye, with inactive choroidal neovascularization: Secondary | ICD-10-CM | POA: Diagnosis not present

## 2023-09-19 DIAGNOSIS — G43B0 Ophthalmoplegic migraine, not intractable: Secondary | ICD-10-CM | POA: Diagnosis not present

## 2023-09-19 DIAGNOSIS — H353211 Exudative age-related macular degeneration, right eye, with active choroidal neovascularization: Secondary | ICD-10-CM | POA: Diagnosis not present

## 2023-09-19 DIAGNOSIS — H43822 Vitreomacular adhesion, left eye: Secondary | ICD-10-CM | POA: Diagnosis not present

## 2023-09-19 DIAGNOSIS — H43821 Vitreomacular adhesion, right eye: Secondary | ICD-10-CM | POA: Diagnosis not present

## 2023-10-10 NOTE — Progress Notes (Unsigned)
 Cardiology Office Note:    Date:  10/11/2023   ID:  Barbara Mccoy, DOB 06/24/1933, MRN [id number]  PCP:  Geoffry Paradise, MD  Cardiologist:  Little Ishikawa, MD  Electrophysiologist:  None   Referring MD: Geoffry Paradise, MD   Chief Complaint  Patient presents with   Atrial Fibrillation    History of Present Illness:    Barbara Mccoy is a 89 y.o. female with a hx of breast cancer, atrial fibrillation who presents for follow-up.  On 01/02/2021 she presented for lumpectomy and found to be in atrial fibrillation.  She was asymptomatic.    Echocardiogram on 01/13/2021 showed normal biventricular function, no significant valvular disease.  Underwent successful cardioversion on 01/30/2021, but was back in A-fib at follow-up appointment 03/2021.  Echocardiogram 09/14/2021 showed EF 60 to 65%, normal RV function, mild to moderate MR.  Since last clinic visit, she reports she is doing okay.  Having worse IBS symptoms recently.  She denies any chest pain, dyspnea, lightheadedness, syncope, lower extremity edema.  Reports occasional palpitations but causing minimal symptoms.  Denies any bleeding on Eliquis recently, though has noted some bright red blood in stool when she has had to strain when having bowel movements, though none recently.  She exercises regularly, does exercise class 3 days/week.   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    Past Surgical  History:  Procedure Laterality Date   BREAST LUMPECTOMY WITH RADIOACTIVE SEED LOCALIZATION Left 01/02/2021   Procedure: LEFT BREAST LUMPECTOMY WITH RADIOACTIVE SEED LOCALIZATION;  Surgeon: Harriette Bouillon, MD;  Location: [facility];  Service: General;  Laterality: Left;   CARDIOVERSION N/A 01/30/2021   Procedure: CARDIOVERSION;  Surgeon: Sande Rives, MD;  Location: [facility] END[facility]COPY;  Service: Cardiovascular;  Laterality: N/A;   CATARACT EXTRACTION, BILATERAL     INGUINAL HERNIA REPAIR     in 40's   SKIN CANCER EXCISION     TONSILLECTOMY     in childhood   TOTAL ABDOMINAL HYSTERECTOMY     w/ BSO, in 40's    Current Medications: Current Meds  Medication Sig   apixaban (ELIQUIS) 5 MG TABS tablet TAKE ONE TABLET BY MOUTH TWICE DAILY. needs labs AT NEXT appt   calcium citrate (CALCITRATE - DOSED IN MG ELEMENTAL CALCIUM) 950 (200 Ca) MG tablet Take 200 mg of elemental calcium by mouth 2 (two) times daily.   cholecalciferol (VITAMIN D3) 25 MCG (1000 UNIT) tablet Take 1,000 Units by mouth daily.   influenza vaccine adjuvanted (FLUAD QUADRIVALENT) 0.5 ML injection Inject into the muscle.   influenza vaccine adjuvanted (FLUAD) 0.5 ML injection Inject into the muscle.   psyllium (METAMUCIL) 28 % packet 1 packet with 8 ounces of liquid as needed PRN   simvastatin (ZOCOR) 20 MG tablet Take 20 mg by mouth daily.  Allergies:   Codeine   Social History   Socioeconomic History   Marital status: Widowed    Spouse name: Not on file   Number of children: Not on file   Years of education: Not on file   Highest education level: Not on file  Occupational History   Not on file  Tobacco Use   Smoking status: Never   Smokeless tobacco: Never  Substance and Sexual Activity   Alcohol use: Yes    Alcohol/week: 5.0 standard drinks of alcohol    Types: 4 Glasses of wine, 1 Shots of liquor per week   Drug use: Never   Sexual activity: Not Currently    Partners: Male    Comment:  widowed  Other Topics Concern   Not on file  Social History Narrative   Not on file   Social Drivers of Health   Financial Resource Strain: Low Risk  (12/24/2020)   Overall Financial Resource Strain (CARDIA)    Difficulty of Paying Living Expenses: Not hard at all  Food Insecurity: No Food Insecurity (12/24/2020)   Hunger Vital Sign    Worried About Running Out of Food in the Last Year: Never true    Ran Out of Food in the Last Year: Never true  Transportation Needs: No Transportation Needs (12/24/2020)   PRAPARE - Administrator, Civil Service (Medical): No    Lack of Transportation (Non-Medical): No  Physical Activity: Not on file  Stress: Not on file  Social Connections: Not on file     Family History: The patient's family history includes Brain cancer in her father.  ROS:   Please see the history of present illness.     All other systems reviewed and are negative.  EKGs/Labs/Other Studies Reviewed:    The following studies were reviewed today:  Echo 01/13/2021:  1. Left ventricular ejection fraction, by estimation, is 60 to 65%. The  left ventricle has normal function. The left ventricle has no regional  wall motion abnormalities. There is mild left ventricular hypertrophy.  Left ventricular diastolic function  could not be evaluated.   2. Right ventricular systolic function is low normal. The right  ventricular size is normal. There is normal pulmonary artery systolic  pressure. The estimated right ventricular systolic pressure is 32.2 mmHg.   3. Right atrial size was top normal.   4. The mitral valve is abnormal. Trivial mitral valve regurgitation.   5. The aortic valve is tricuspid. Aortic valve regurgitation is not  visualized.   6. The inferior vena cava is dilated in size with >50% respiratory  variability, suggesting right atrial pressure of 8 mmHg.   Comparison(s): No prior Echocardiogram.  US Carotid Duplex 04/01/2020: Right Carotid:  Velocities in the right ICA are consistent with a 1-39%  stenosis.   Left Carotid: Velocities in the left ICA are consistent with a 1-39%  stenosis.   Vertebrals:  Bilateral vertebral arteries demonstrate antegrade flow.  Subclavians: Normal flow hemodynamics were seen in bilateral subclavian arteries.   EKG:   09/23/22: Atrial fibrillation, heart rate 83, Q waves in V1/2 03/23/22: Afib,rate 68, low voltage, Q waves in V1/2 09/16/21: Afib, rate 74,Q waves in V1//2 03/03/21: Afib, low voltage, rate 74, Q waves in V1/2 01/23/2021: Atrial flutter with variable conduction, rate 65, Q waves in V1/2 01/05/2021: Atrial flutter with variable conduction, rate 83, Q waves in V1/2  Recent Labs: No results found for requested labs within last 365 days.  Recent Lipid Panel No  results found for: "CHOL", "TRIG", "HDL", "CHOLHDL", "VLDL", "LDLCALC", "LDLDIRECT"  Physical Exam:    VS:  BP (!) 150/68   Pulse 78   Ht 5\' 5"  (1.651 m)   Wt 153 lb 3.2 oz (69.5 kg)   LMP  (LMP Unknown)   SpO2 96%   BMI 25.49 kg/m     Wt Readings from Last 3 Encounters:  10/11/23 153 lb 3.2 oz (69.5 kg)  04/08/23 165 lb (74.8 kg)  09/23/22 164 lb 3.2 oz (74.5 kg)     GEN: Well nourished, well developed in no acute distress HEENT: Normal NECK: No JVD; No carotid bruits CARDIAC: Irregular rhythm,normal rate, no murmurs, rubs, gallops RESPIRATORY:  Clear to auscultation without rales, wheezing or rhonchi  ABDOMEN: Soft, non-tender, non-distended MUSCULOSKELETAL:  No edema; No deformity  SKIN: Warm and dry NEUROLOGIC:  Alert and oriented x 3 PSYCHIATRIC:  Normal affect   ASSESSMENT:    1. Permanent atrial fibrillation (HCC)   2. Essential hypertension   3. Hyperlipidemia, unspecified hyperlipidemia type   4. Bilateral carotid artery stenosis        PLAN:    Atrial fibrillation/flutter: New diagnosis 12/2020.  She is asymptomatic.  CHA2DS2-VASc score 3 (age x2, female).  Echocardiogram on 01/13/2021 showed  normal biventricular function, no significant valvular disease.  Underwent successful cardioversion on 01/30/2021 but back in Afib at f/u appointment 03/2021.  Echocardiogram 09/14/2021 showed EF 60 to 65%, normal RV function, mild to moderate MR. -Given she is rate controlled and asymptomatic and failed prior cardioversion, recommend rate control strategy.  -Zio patch x3 days showed low rates (average rate 64) and pauses up to 3 seconds.  She felt poorly since starting metoprolol, suspect this is due to bradycardia.  Metoprolol discontinued.  Rates appear well controlled off of metoprolol -Continue Eliquis 5 mg twice daily.    Hyperlipidemia: On simvastatin 20 mg daily.  LDL 42 on 08/02/23  Hypertension: BP elevated at prior clinic appointment and home log, was started on losartan 25 mg daily.  Did not tolerate losartan, was discontinued.  BP mildly elevated in clinic, will continue to monitor  Carotid stenosis: mild bilateral carotid stenosis on duplex 04/01/20.  Stable on duplex 10/2022.  Continue Eliquis, statin   RTC in 1 year  Medication Adjustments/Labs and Tests Ordered: Current medicines are reviewed at length with the patient today.  Concerns regarding medicines are outlined above.  No orders of the defined types were placed in this encounter.    No orders of the defined types were placed in this encounter.    Patient Instructions  Medication Instructions:  No Changes *If you need a refill on your cardiac medications before your next appointment, please call your pharmacy*  Lab Work: None  Testing/Procedures: None  Follow-Up: At [facility], you and your health needs are our priority.  As part of our continuing mission to provide you with exceptional heart care, we have created designated Provider Care Teams.  These Care Teams include your primary Cardiologist (physician) and Advanced Practice Providers (APPs -  Physician Assistants and Nurse Practitioners) who all  work together to provide you with the care you need, when you need it.   Your next appointment:   1 year(s)  Provider:   Little Ishikawa, MD    Other Instructions Please call [phone] or send [place] a MyChart message if you have any Cardiology related concerns or feel like you need to be seen in the clinic.  Signed, Little Ishikawa, MD  10/11/2023 1:56 PM    [facility]

## 2023-10-11 ENCOUNTER — Encounter: Payer: Self-pay | Admitting: Cardiology

## 2023-10-11 ENCOUNTER — Ambulatory Visit: Payer: Medicare Other | Attending: Cardiology | Admitting: Cardiology

## 2023-10-11 VITALS — BP 150/68 | HR 78 | Ht 65.0 in | Wt 153.2 lb

## 2023-10-11 DIAGNOSIS — I4821 Permanent atrial fibrillation: Secondary | ICD-10-CM | POA: Insufficient documentation

## 2023-10-11 DIAGNOSIS — E785 Hyperlipidemia, unspecified: Secondary | ICD-10-CM | POA: Diagnosis not present

## 2023-10-11 DIAGNOSIS — I6523 Occlusion and stenosis of bilateral carotid arteries: Secondary | ICD-10-CM | POA: Insufficient documentation

## 2023-10-11 DIAGNOSIS — I10 Essential (primary) hypertension: Secondary | ICD-10-CM | POA: Diagnosis not present

## 2023-10-11 NOTE — Patient Instructions (Signed)
 Medication Instructions:  No Changes *If you need a refill on your cardiac medications before your next appointment, please call your pharmacy*  Lab Work: None  Testing/Procedures: None  Follow-Up: At [facility], you and your health needs are our priority.  As part of our continuing mission to provide you with exceptional heart care, we have created designated Provider Care Teams.  These Care Teams include your primary Cardiologist (physician) and Advanced Practice Providers (APPs -  Physician Assistants and Nurse Practitioners) who all work together to provide you with the care you need, when you need it.   Your next appointment:   1 year(s)  Provider:   Little Ishikawa, MD    Other Instructions Please call [phone] or send [place] a MyChart message if you have any Cardiology related concerns or feel like you need to be seen in the clinic.

## 2024-01-02 ENCOUNTER — Other Ambulatory Visit: Payer: Self-pay | Admitting: Cardiology

## 2024-01-02 DIAGNOSIS — I4821 Permanent atrial fibrillation: Secondary | ICD-10-CM

## 2024-01-02 NOTE — Telephone Encounter (Signed)
 Prescription refill request for Eliquis  received. Indication: Afib  Last office visit: 10/11/23 Alda Amas)  Scr: 0.74 (08/02/23 via [facility])  Age: 89 Weight: 69.5kg  Appropriate dose. Refill sent.

## 2024-01-16 DIAGNOSIS — H43821 Vitreomacular adhesion, right eye: Secondary | ICD-10-CM | POA: Diagnosis not present

## 2024-01-16 DIAGNOSIS — H353222 Exudative age-related macular degeneration, left eye, with inactive choroidal neovascularization: Secondary | ICD-10-CM | POA: Diagnosis not present

## 2024-01-16 DIAGNOSIS — H43822 Vitreomacular adhesion, left eye: Secondary | ICD-10-CM | POA: Diagnosis not present

## 2024-01-16 DIAGNOSIS — H353133 Nonexudative age-related macular degeneration, bilateral, advanced atrophic without subfoveal involvement: Secondary | ICD-10-CM | POA: Diagnosis not present

## 2024-01-16 DIAGNOSIS — H353211 Exudative age-related macular degeneration, right eye, with active choroidal neovascularization: Secondary | ICD-10-CM | POA: Diagnosis not present

## 2024-01-27 DIAGNOSIS — Z1231 Encounter for screening mammogram for malignant neoplasm of breast: Secondary | ICD-10-CM | POA: Diagnosis not present

## 2024-01-30 DIAGNOSIS — H43821 Vitreomacular adhesion, right eye: Secondary | ICD-10-CM | POA: Diagnosis not present

## 2024-01-30 DIAGNOSIS — H43822 Vitreomacular adhesion, left eye: Secondary | ICD-10-CM | POA: Diagnosis not present

## 2024-01-30 DIAGNOSIS — H353133 Nonexudative age-related macular degeneration, bilateral, advanced atrophic without subfoveal involvement: Secondary | ICD-10-CM | POA: Diagnosis not present

## 2024-01-30 DIAGNOSIS — H353211 Exudative age-related macular degeneration, right eye, with active choroidal neovascularization: Secondary | ICD-10-CM | POA: Diagnosis not present

## 2024-01-30 DIAGNOSIS — G43B0 Ophthalmoplegic migraine, not intractable: Secondary | ICD-10-CM | POA: Diagnosis not present

## 2024-01-30 DIAGNOSIS — H353222 Exudative age-related macular degeneration, left eye, with inactive choroidal neovascularization: Secondary | ICD-10-CM | POA: Diagnosis not present

## 2024-02-08 DIAGNOSIS — H3530 Unspecified macular degeneration: Secondary | ICD-10-CM | POA: Diagnosis not present

## 2024-02-08 DIAGNOSIS — R7301 Impaired fasting glucose: Secondary | ICD-10-CM | POA: Diagnosis not present

## 2024-02-08 DIAGNOSIS — F32A Depression, unspecified: Secondary | ICD-10-CM | POA: Diagnosis not present

## 2024-02-08 DIAGNOSIS — E785 Hyperlipidemia, unspecified: Secondary | ICD-10-CM | POA: Diagnosis not present

## 2024-02-20 DIAGNOSIS — H353133 Nonexudative age-related macular degeneration, bilateral, advanced atrophic without subfoveal involvement: Secondary | ICD-10-CM | POA: Diagnosis not present

## 2024-02-20 DIAGNOSIS — H353222 Exudative age-related macular degeneration, left eye, with inactive choroidal neovascularization: Secondary | ICD-10-CM | POA: Diagnosis not present

## 2024-02-20 DIAGNOSIS — H43822 Vitreomacular adhesion, left eye: Secondary | ICD-10-CM | POA: Diagnosis not present

## 2024-02-20 DIAGNOSIS — H353211 Exudative age-related macular degeneration, right eye, with active choroidal neovascularization: Secondary | ICD-10-CM | POA: Diagnosis not present

## 2024-02-20 DIAGNOSIS — G43B0 Ophthalmoplegic migraine, not intractable: Secondary | ICD-10-CM | POA: Diagnosis not present

## 2024-02-20 DIAGNOSIS — H43821 Vitreomacular adhesion, right eye: Secondary | ICD-10-CM | POA: Diagnosis not present

## 2024-02-23 DIAGNOSIS — Z85828 Personal history of other malignant neoplasm of skin: Secondary | ICD-10-CM | POA: Diagnosis not present

## 2024-02-23 DIAGNOSIS — L720 Epidermal cyst: Secondary | ICD-10-CM | POA: Diagnosis not present

## 2024-02-23 DIAGNOSIS — L821 Other seborrheic keratosis: Secondary | ICD-10-CM | POA: Diagnosis not present

## 2024-02-23 DIAGNOSIS — D225 Melanocytic nevi of trunk: Secondary | ICD-10-CM | POA: Diagnosis not present

## 2024-02-23 DIAGNOSIS — L570 Actinic keratosis: Secondary | ICD-10-CM | POA: Diagnosis not present

## 2024-02-23 DIAGNOSIS — D1801 Hemangioma of skin and subcutaneous tissue: Secondary | ICD-10-CM | POA: Diagnosis not present

## 2024-02-23 DIAGNOSIS — L820 Inflamed seborrheic keratosis: Secondary | ICD-10-CM | POA: Diagnosis not present

## 2024-02-23 DIAGNOSIS — C44719 Basal cell carcinoma of skin of left lower limb, including hip: Secondary | ICD-10-CM | POA: Diagnosis not present

## 2024-03-12 DIAGNOSIS — H353222 Exudative age-related macular degeneration, left eye, with inactive choroidal neovascularization: Secondary | ICD-10-CM | POA: Diagnosis not present

## 2024-03-12 DIAGNOSIS — H43822 Vitreomacular adhesion, left eye: Secondary | ICD-10-CM | POA: Diagnosis not present

## 2024-03-12 DIAGNOSIS — H353133 Nonexudative age-related macular degeneration, bilateral, advanced atrophic without subfoveal involvement: Secondary | ICD-10-CM | POA: Diagnosis not present

## 2024-03-12 DIAGNOSIS — H353211 Exudative age-related macular degeneration, right eye, with active choroidal neovascularization: Secondary | ICD-10-CM | POA: Diagnosis not present

## 2024-03-12 DIAGNOSIS — H43821 Vitreomacular adhesion, right eye: Secondary | ICD-10-CM | POA: Diagnosis not present

## 2024-03-12 DIAGNOSIS — G43B0 Ophthalmoplegic migraine, not intractable: Secondary | ICD-10-CM | POA: Diagnosis not present

## 2024-03-13 DIAGNOSIS — L089 Local infection of the skin and subcutaneous tissue, unspecified: Secondary | ICD-10-CM | POA: Diagnosis not present

## 2024-03-13 DIAGNOSIS — W57XXXA Bitten or stung by nonvenomous insect and other nonvenomous arthropods, initial encounter: Secondary | ICD-10-CM | POA: Diagnosis not present

## 2024-03-13 DIAGNOSIS — D485 Neoplasm of uncertain behavior of skin: Secondary | ICD-10-CM | POA: Diagnosis not present

## 2024-04-09 DIAGNOSIS — H353133 Nonexudative age-related macular degeneration, bilateral, advanced atrophic without subfoveal involvement: Secondary | ICD-10-CM | POA: Diagnosis not present

## 2024-04-09 DIAGNOSIS — H353222 Exudative age-related macular degeneration, left eye, with inactive choroidal neovascularization: Secondary | ICD-10-CM | POA: Diagnosis not present

## 2024-04-09 DIAGNOSIS — H353211 Exudative age-related macular degeneration, right eye, with active choroidal neovascularization: Secondary | ICD-10-CM | POA: Diagnosis not present

## 2024-04-09 DIAGNOSIS — H43821 Vitreomacular adhesion, right eye: Secondary | ICD-10-CM | POA: Diagnosis not present

## 2024-04-09 DIAGNOSIS — H43822 Vitreomacular adhesion, left eye: Secondary | ICD-10-CM | POA: Diagnosis not present

## 2024-04-09 DIAGNOSIS — G43B0 Ophthalmoplegic migraine, not intractable: Secondary | ICD-10-CM | POA: Diagnosis not present

## 2024-04-27 DIAGNOSIS — H52203 Unspecified astigmatism, bilateral: Secondary | ICD-10-CM | POA: Diagnosis not present

## 2024-04-27 DIAGNOSIS — H26493 Other secondary cataract, bilateral: Secondary | ICD-10-CM | POA: Diagnosis not present

## 2024-04-27 DIAGNOSIS — Z961 Presence of intraocular lens: Secondary | ICD-10-CM | POA: Diagnosis not present

## 2024-05-01 DIAGNOSIS — Z23 Encounter for immunization: Secondary | ICD-10-CM | POA: Diagnosis not present

## 2024-05-26 ENCOUNTER — Other Ambulatory Visit: Payer: Self-pay

## 2024-05-26 ENCOUNTER — Encounter (HOSPITAL_BASED_OUTPATIENT_CLINIC_OR_DEPARTMENT_OTHER): Payer: Self-pay

## 2024-05-26 ENCOUNTER — Emergency Department (HOSPITAL_BASED_OUTPATIENT_CLINIC_OR_DEPARTMENT_OTHER)
Admission: EM | Admit: 2024-05-26 | Discharge: 2024-05-26 | Disposition: A | Discharge status: Home / Self Care | Attending: Emergency Medicine | Admitting: Emergency Medicine

## 2024-05-26 DIAGNOSIS — Z4800 Encounter for change or removal of nonsurgical wound dressing: Secondary | ICD-10-CM | POA: Diagnosis not present

## 2024-05-26 DIAGNOSIS — Z5189 Encounter for other specified aftercare: Secondary | ICD-10-CM

## 2024-05-26 NOTE — ED Notes (Signed)
 Drew a circle around injury due to patient stating redness just started today.

## 2024-05-26 NOTE — ED Provider Notes (Signed)
 [place] EMERGENCY DEPARTMENT AT [facility] Provider Note   CSN: [id number] Arrival date & time: 05/26/24  1255     Patient presents with: Wound Check (Left leg)  HPI Barbara Mccoy is a 89 y.o. female presenting for wound check.  Reporting a wound overlying the left mid shin.  She states she hit it on a piece of furniture 2 weeks ago.  Reports that she is on a blood thinner.  Bleeding stopped about 6 days ago.  No longer oozing or bleeding.  She noticed an area of redness around the wound that seem to be larger this morning but she states that now that that has resolved.  Has had intermittent tenderness in that area but no pain or tenderness at this time.  Denies any pain in the calf.  Denies chest pain or shortness of breath.    Wound Check       Prior to Admission medications   Medication Sig Start Date End Date Taking? Authorizing Provider  apixaban  (ELIQUIS ) 5 MG TABS tablet Take 1 tablet (5 mg total) by mouth 2 (two) times daily. 01/02/24   Kate Lonni CROME, MD  calcium citrate (CALCITRATE - DOSED IN MG ELEMENTAL CALCIUM) 950 (200 Ca) MG tablet Take 200 mg of elemental calcium by mouth 2 (two) times daily.    [provider]  cholecalciferol (VITAMIN D3) 25 MCG (1000 UNIT) tablet Take 1,000 Units by mouth daily.    [provider]  Cholecalciferol 25 MCG (1000 UT) CHEW 25 tablets. Patient not taking: Reported on 04/08/2023    [provider]  influenza vaccine adjuvanted (FLUAD  QUADRIVALENT) 0.5 ML injection Inject into the muscle. 05/03/22   Luiz Channel, MD  influenza vaccine adjuvanted (FLUAD ) 0.5 ML injection Inject into the muscle. 05/26/21     psyllium (METAMUCIL) 28 % packet 1 packet with 8 ounces of liquid as needed PRN    [provider]  simvastatin (ZOCOR) 20 MG tablet Take 20 mg by mouth daily.    [provider]    Allergies: Codeine    Review of Systems See HPI  Updated Vital Signs BP (!) 174/96  (BP Location: Right Arm)   Pulse 85   Temp 98.2 F (36.8 C) (Oral)   Resp 14   Ht 5' 5 (1.651 m)   Wt 69.5 kg   LMP  (LMP Unknown)   SpO2 100%   BMI 25.50 kg/m   Physical Exam Musculoskeletal:     Comments: No swelling or tenderness of the left calf.  Pedal pulses are 2+ bilaterally.  Skin:        (all labs ordered are listed, but only abnormal results are displayed) Labs Reviewed - No data to display  EKG: None  Radiology: No results found.   Procedures   Medications Ordered in the ED - No data to display                                  Medical Decision Making  89 year old well-appearing female presenting for superficial wound overlying the left mid shin.  Exam findings were overall reassuring.  Suspect this is a wound that is healing adequately enough provided that she is on a blood thinner.  There is a small area of surrounding erythema but does not appear to be actively infected.  We discussed the utility of antibiotics but she refused at this time and stated she is rather  just keep a close eye on it and follow-up with her PCP.  Thought this was reasonable given how well she appears and the wound overall appears to be healing.  Discussed return precautions.  Discharged good condition.  Also considered DVT but feel its unlikely given no swelling or tenderness behind the leg and the wound is very obviously superficial.     Final diagnoses:  Visit for wound check    ED Discharge Orders     None          Lang Norleen POUR, PA-C 05/26/24 1430    Bernard Drivers, MD 05/28/24 [phone]

## 2024-05-26 NOTE — ED Notes (Signed)
 Reviewed AVS/discharge instruction with patient. Time allotted for and all questions answered. Patient is agreeable for d/c and escorted to ed exit by staff.

## 2024-05-26 NOTE — Discharge Instructions (Addendum)
 Evaluation today was overall reassuring.  As we discussed, it appears that the wound overlying your left shin is healing adequately.  Recommend that you follow-up with your PCP in 3 to 4 days for reevaluation.  As we discussed as well if it becomes more red, swollen and oozes pustulant discharge, you develop a fever or any other concerning symptom please return to the ED for further evaluation.

## 2024-05-26 NOTE — ED Triage Notes (Signed)
 Arrives ambulatory to the ED with complaints of a wound to her left lower leg. Patient states that she hit her shin against something 2 weeks ago and the site has been healing slowly. No fevers or drainage. No pain.

## 2024-06-04 DIAGNOSIS — H43821 Vitreomacular adhesion, right eye: Secondary | ICD-10-CM | POA: Diagnosis not present

## 2024-06-04 DIAGNOSIS — H353222 Exudative age-related macular degeneration, left eye, with inactive choroidal neovascularization: Secondary | ICD-10-CM | POA: Diagnosis not present

## 2024-06-04 DIAGNOSIS — H43822 Vitreomacular adhesion, left eye: Secondary | ICD-10-CM | POA: Diagnosis not present

## 2024-06-04 DIAGNOSIS — H353133 Nonexudative age-related macular degeneration, bilateral, advanced atrophic without subfoveal involvement: Secondary | ICD-10-CM | POA: Diagnosis not present

## 2024-06-04 DIAGNOSIS — H353211 Exudative age-related macular degeneration, right eye, with active choroidal neovascularization: Secondary | ICD-10-CM | POA: Diagnosis not present

## 2024-06-04 DIAGNOSIS — G43B0 Ophthalmoplegic migraine, not intractable: Secondary | ICD-10-CM | POA: Diagnosis not present

## 2024-07-03 ENCOUNTER — Other Ambulatory Visit: Payer: Self-pay | Admitting: Cardiology

## 2024-07-03 DIAGNOSIS — I4821 Permanent atrial fibrillation: Secondary | ICD-10-CM

## 2024-07-04 NOTE — Telephone Encounter (Signed)
 Eliquis  5mg  refill request received. Patient is 89 years old, weight-69.5kg, Crea-0.74 on 08/01/23 via [facility] from [facility], Diagnosis-Afib, and last seen by Dr. Kate on 10/11/23 and pending appt on 10/13/23. Dose is appropriate based on dosing criteria. Will send in refill to requested pharmacy.

## 2024-10-12 ENCOUNTER — Ambulatory Visit: Admitting: Cardiology
